# Patient Record
Sex: Male | Born: 1950
Health system: Southern US, Community
[De-identification: ages and names within clinical notes are randomized; demographics above are authoritative.]

## PROBLEM LIST (undated history)

## (undated) DIAGNOSIS — Z9889 Other specified postprocedural states: Secondary | ICD-10-CM

## (undated) DIAGNOSIS — N401 Enlarged prostate with lower urinary tract symptoms: Secondary | ICD-10-CM

## (undated) DIAGNOSIS — I10 Essential (primary) hypertension: Secondary | ICD-10-CM

## (undated) DIAGNOSIS — I219 Acute myocardial infarction, unspecified: Secondary | ICD-10-CM

## (undated) DIAGNOSIS — K219 Gastro-esophageal reflux disease without esophagitis: Secondary | ICD-10-CM

## (undated) DIAGNOSIS — Z720 Tobacco use: Secondary | ICD-10-CM

## (undated) DIAGNOSIS — E785 Hyperlipidemia, unspecified: Secondary | ICD-10-CM

## (undated) DIAGNOSIS — Z951 Presence of aortocoronary bypass graft: Secondary | ICD-10-CM

## (undated) DIAGNOSIS — D649 Anemia, unspecified: Secondary | ICD-10-CM

## (undated) DIAGNOSIS — R112 Nausea with vomiting, unspecified: Secondary | ICD-10-CM

## (undated) DIAGNOSIS — N138 Other obstructive and reflux uropathy: Secondary | ICD-10-CM

## (undated) DIAGNOSIS — I2510 Atherosclerotic heart disease of native coronary artery without angina pectoris: Secondary | ICD-10-CM

## (undated) DIAGNOSIS — Z8719 Personal history of other diseases of the digestive system: Secondary | ICD-10-CM

## (undated) DIAGNOSIS — I739 Peripheral vascular disease, unspecified: Secondary | ICD-10-CM

## (undated) DIAGNOSIS — G709 Myoneural disorder, unspecified: Secondary | ICD-10-CM

## (undated) DIAGNOSIS — I70209 Unspecified atherosclerosis of native arteries of extremities, unspecified extremity: Secondary | ICD-10-CM

## (undated) HISTORY — DX: Atherosclerotic heart disease of native coronary artery without angina pectoris: I25.10

## (undated) HISTORY — DX: Tobacco use: Z72.0

## (undated) HISTORY — PX: BACK SURGERY: SHX140

## (undated) HISTORY — DX: Acute myocardial infarction, unspecified: I21.9

## (undated) HISTORY — DX: Unspecified atherosclerosis of native arteries of extremities, unspecified extremity: I70.209

## (undated) HISTORY — DX: Peripheral vascular disease, unspecified: I73.9

## (undated) HISTORY — DX: Hyperlipidemia, unspecified: E78.5

## (undated) HISTORY — DX: Presence of aortocoronary bypass graft: Z95.1

---

## 1998-08-05 ENCOUNTER — Inpatient Hospital Stay (HOSPITAL_COMMUNITY): Admission: RE | Admit: 1998-08-05 | Discharge: 1998-08-06 | Payer: Self-pay | Admitting: Neurosurgery

## 1999-07-01 ENCOUNTER — Emergency Department (HOSPITAL_COMMUNITY): Admission: EM | Admit: 1999-07-01 | Discharge: 1999-07-01 | Payer: Self-pay | Admitting: Emergency Medicine

## 2001-11-24 DIAGNOSIS — I219 Acute myocardial infarction, unspecified: Secondary | ICD-10-CM

## 2001-11-24 HISTORY — DX: Acute myocardial infarction, unspecified: I21.9

## 2001-12-03 ENCOUNTER — Encounter: Payer: Self-pay | Admitting: Cardiology

## 2001-12-03 ENCOUNTER — Inpatient Hospital Stay (HOSPITAL_COMMUNITY): Admission: EM | Admit: 2001-12-03 | Discharge: 2001-12-06 | Payer: Self-pay | Admitting: *Deleted

## 2001-12-03 HISTORY — PX: CARDIAC CATHETERIZATION: SHX172

## 2002-01-27 ENCOUNTER — Inpatient Hospital Stay (HOSPITAL_COMMUNITY): Admission: EM | Admit: 2002-01-27 | Discharge: 2002-01-28 | Payer: Self-pay | Admitting: Emergency Medicine

## 2002-01-27 ENCOUNTER — Encounter: Payer: Self-pay | Admitting: Cardiovascular Disease

## 2002-01-30 ENCOUNTER — Ambulatory Visit (HOSPITAL_COMMUNITY): Admission: RE | Admit: 2002-01-30 | Discharge: 2002-01-31 | Payer: Self-pay | Admitting: Cardiovascular Disease

## 2002-01-30 HISTORY — PX: ILIAC ARTERY STENT: SHX1786

## 2002-02-18 ENCOUNTER — Ambulatory Visit (HOSPITAL_COMMUNITY): Admission: RE | Admit: 2002-02-18 | Discharge: 2002-02-18 | Payer: Self-pay | Admitting: Cardiovascular Disease

## 2002-05-06 HISTORY — PX: CARDIAC CATHETERIZATION: SHX172

## 2002-05-07 ENCOUNTER — Inpatient Hospital Stay (HOSPITAL_COMMUNITY): Admission: RE | Admit: 2002-05-07 | Discharge: 2002-05-09 | Payer: Self-pay | Admitting: Cardiovascular Disease

## 2002-06-02 ENCOUNTER — Inpatient Hospital Stay (HOSPITAL_COMMUNITY): Admission: EM | Admit: 2002-06-02 | Discharge: 2002-06-04 | Payer: Self-pay

## 2002-06-03 HISTORY — PX: CARDIAC CATHETERIZATION: SHX172

## 2002-08-06 ENCOUNTER — Ambulatory Visit (HOSPITAL_COMMUNITY): Admission: RE | Admit: 2002-08-06 | Discharge: 2002-08-06 | Payer: Self-pay | Admitting: Internal Medicine

## 2003-03-20 ENCOUNTER — Encounter: Payer: Self-pay | Admitting: Emergency Medicine

## 2003-03-20 ENCOUNTER — Inpatient Hospital Stay (HOSPITAL_COMMUNITY): Admission: EM | Admit: 2003-03-20 | Discharge: 2003-03-25 | Payer: Self-pay | Admitting: Emergency Medicine

## 2003-03-24 HISTORY — PX: CARDIAC CATHETERIZATION: SHX172

## 2003-12-08 ENCOUNTER — Inpatient Hospital Stay (HOSPITAL_COMMUNITY): Admission: EM | Admit: 2003-12-08 | Discharge: 2003-12-09 | Payer: Self-pay | Admitting: Emergency Medicine

## 2003-12-08 HISTORY — PX: CARDIAC CATHETERIZATION: SHX172

## 2005-10-11 ENCOUNTER — Ambulatory Visit (HOSPITAL_COMMUNITY): Admission: RE | Admit: 2005-10-11 | Discharge: 2005-10-11 | Payer: Self-pay | Admitting: Family Medicine

## 2007-07-10 ENCOUNTER — Observation Stay (HOSPITAL_COMMUNITY): Admission: EM | Admit: 2007-07-10 | Discharge: 2007-07-11 | Payer: Self-pay | Admitting: Emergency Medicine

## 2007-07-10 HISTORY — PX: CARDIAC CATHETERIZATION: SHX172

## 2007-12-26 DIAGNOSIS — Z951 Presence of aortocoronary bypass graft: Secondary | ICD-10-CM

## 2007-12-26 HISTORY — PX: CORONARY ARTERY BYPASS GRAFT: SHX141

## 2007-12-26 HISTORY — PX: OTHER SURGICAL HISTORY: SHX169

## 2007-12-26 HISTORY — DX: Presence of aortocoronary bypass graft: Z95.1

## 2008-06-04 ENCOUNTER — Encounter: Admission: RE | Admit: 2008-06-04 | Discharge: 2008-06-04 | Payer: Self-pay | Admitting: Cardiovascular Disease

## 2008-06-08 ENCOUNTER — Encounter (INDEPENDENT_AMBULATORY_CARE_PROVIDER_SITE_OTHER): Payer: Self-pay | Admitting: Cardiovascular Disease

## 2008-06-08 ENCOUNTER — Inpatient Hospital Stay (HOSPITAL_COMMUNITY): Admission: AD | Admit: 2008-06-08 | Discharge: 2008-06-17 | Payer: Self-pay | Admitting: Cardiovascular Disease

## 2008-06-08 HISTORY — PX: CARDIAC CATHETERIZATION: SHX172

## 2008-06-15 ENCOUNTER — Ambulatory Visit: Payer: Self-pay | Admitting: Surgery

## 2008-07-14 ENCOUNTER — Ambulatory Visit: Payer: Self-pay | Admitting: Surgery

## 2008-07-14 ENCOUNTER — Encounter: Admission: RE | Admit: 2008-07-14 | Discharge: 2008-07-14 | Payer: Self-pay | Admitting: Surgery

## 2008-07-16 ENCOUNTER — Encounter (HOSPITAL_COMMUNITY): Admission: RE | Admit: 2008-07-16 | Discharge: 2008-08-05 | Payer: Self-pay | Admitting: Cardiovascular Disease

## 2009-01-04 IMAGING — CR DG CHEST 2V
2 series · 2 of 2 positions shown · non-contrast
Comparison: [REDACTED] chest x-ray 06/13/2008.

CLINICAL DATA: Chest pain.  CABG 06/11/2008.

CHEST - 2 VIEW

[w chest pa]
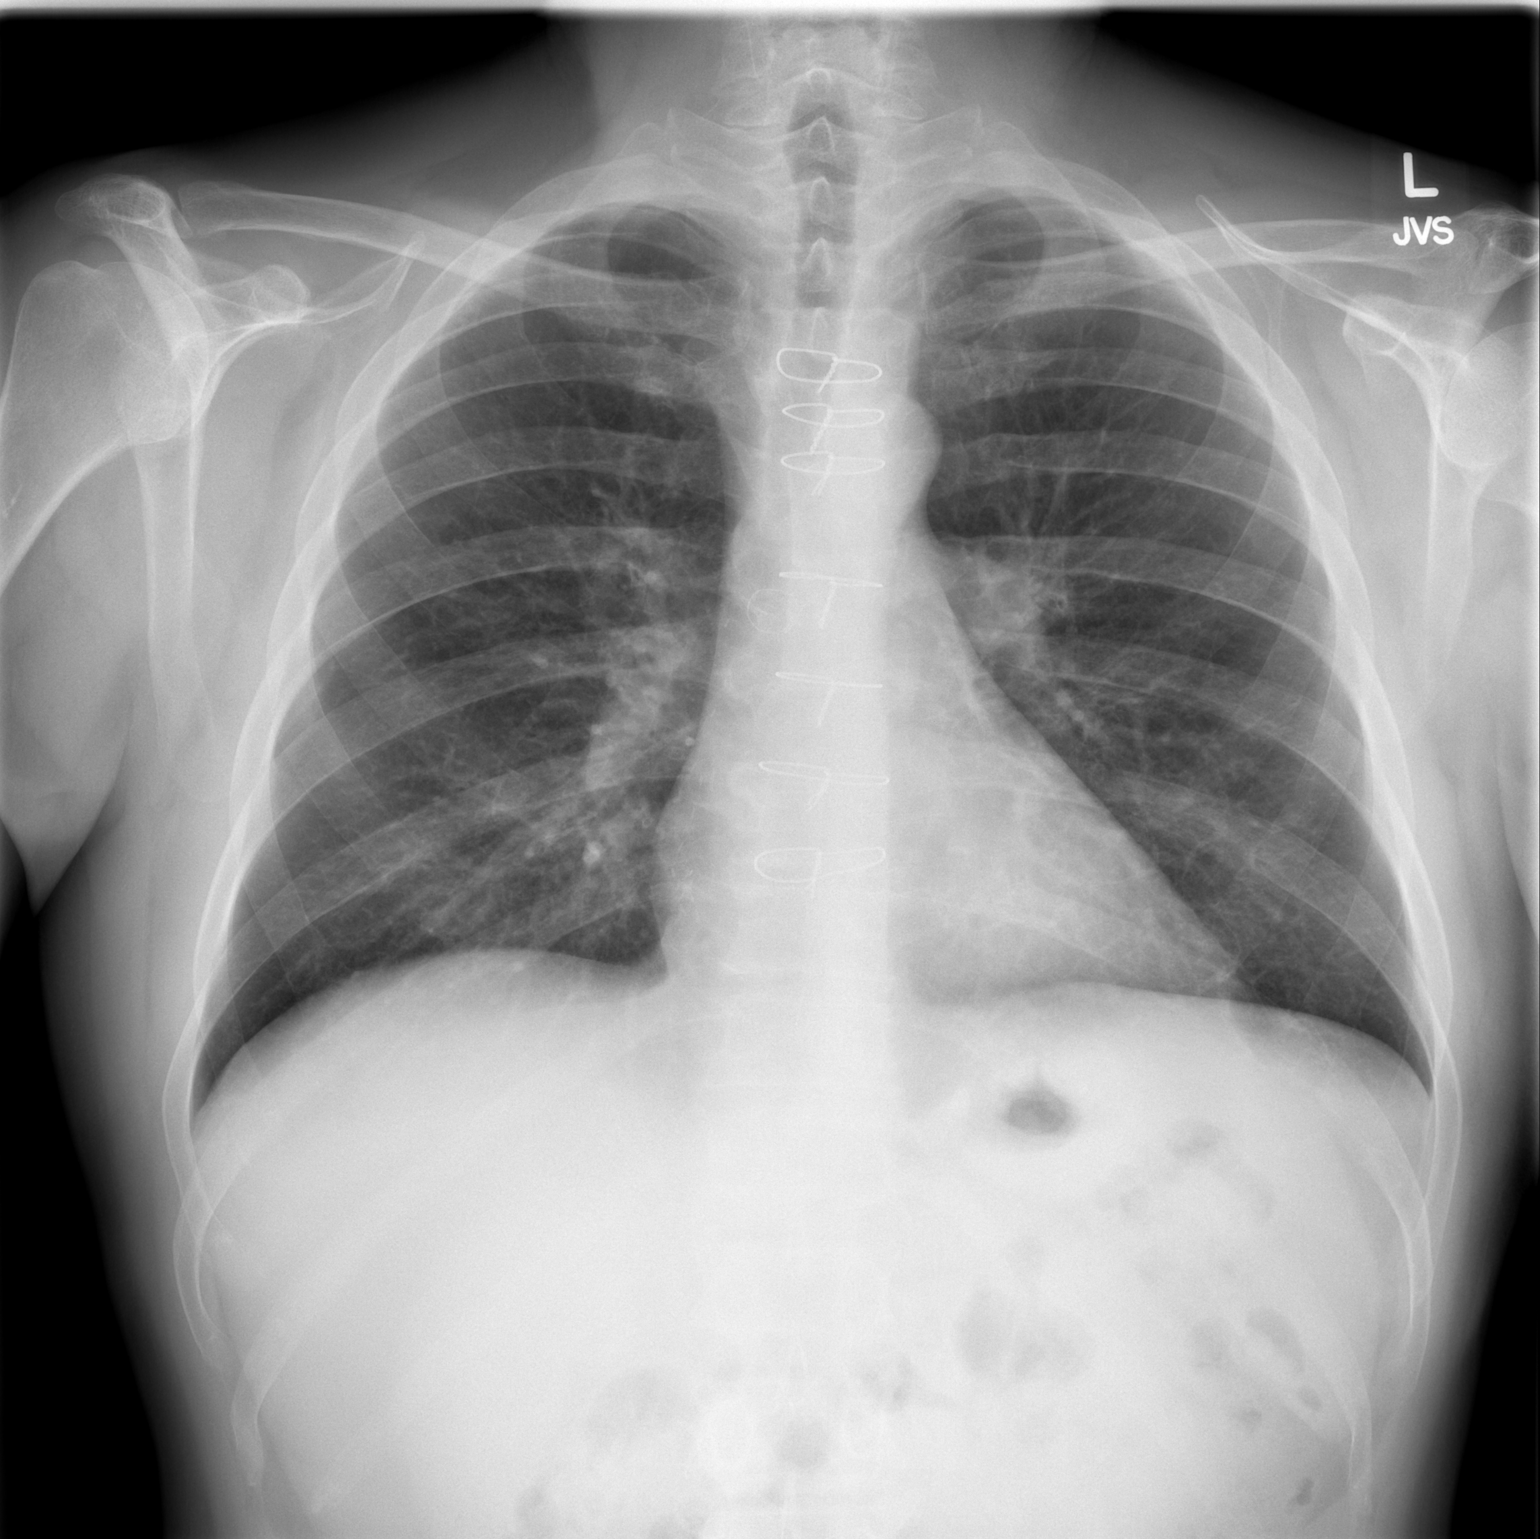

[w chest lat]
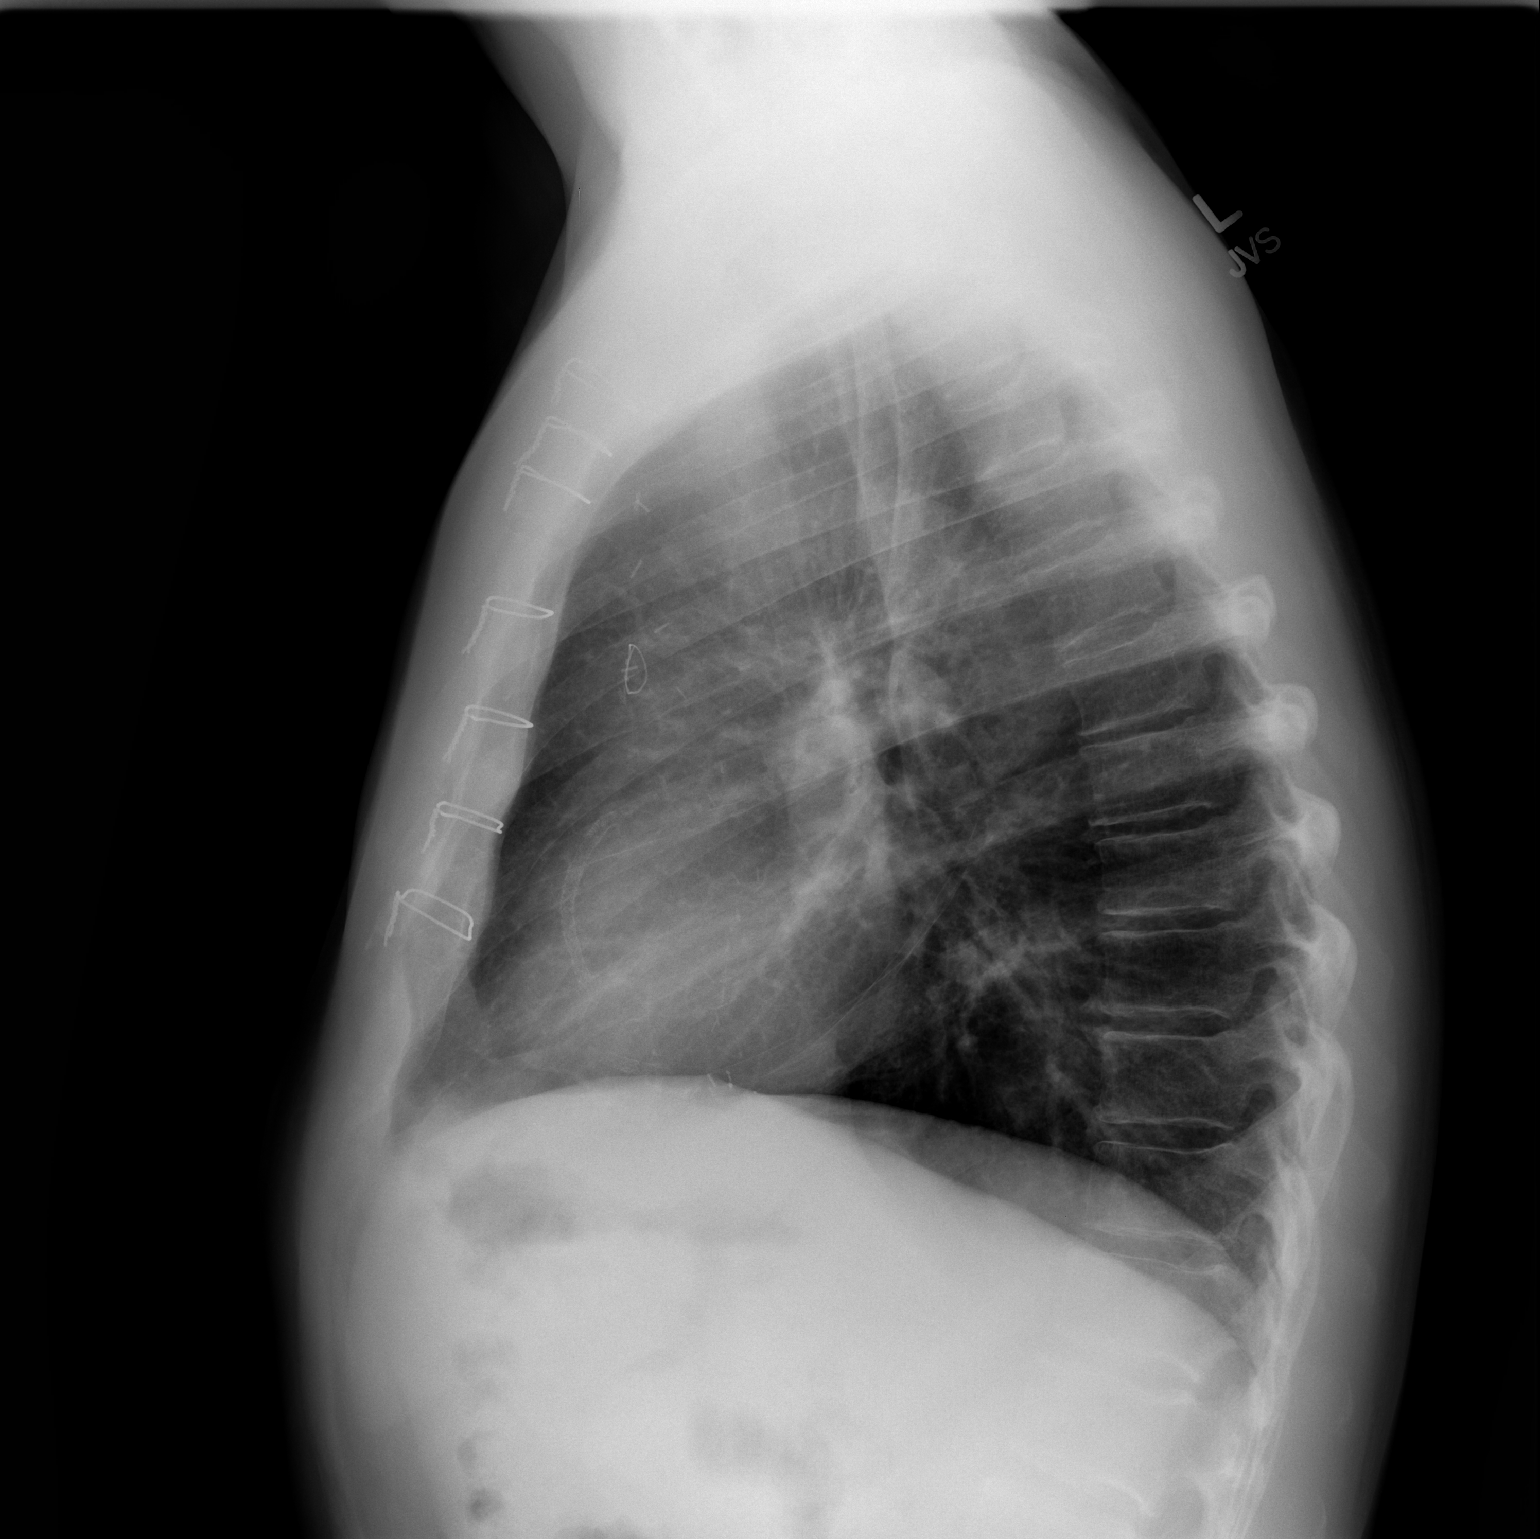

[2 of 2 positions shown; findings below may reference images not displayed]

FINDINGS: Lungs are currently clear with submaximal inspiration.
Heart size is normal with stable post CABG changes and coronary
artery stent best seen on lateral view.  No new significant
abnormality is noted.
IMPRESSION: 1.  Post CABG.
2.  Submaximal inspiration.
3.  No active disease.

## 2010-04-28 ENCOUNTER — Encounter: Admission: RE | Admit: 2010-04-28 | Discharge: 2010-04-28 | Payer: Self-pay | Admitting: Cardiovascular Disease

## 2011-01-15 ENCOUNTER — Encounter: Payer: Self-pay | Admitting: Cardiovascular Disease

## 2011-05-09 NOTE — Op Note (Signed)
NAMEJACADEN, [name]                ACCOUNT NO.:  [id number]   MEDICAL RECORD NO.:  [id number]          PATIENT TYPE:  INP   LOCATION:  2307                         FACILITY:  [facility]   PHYSICIAN:  Evelene Croon, M.D.     DATE OF BIRTH:  [birth year]   DATE OF PROCEDURE:  06/11/2008  DATE OF DISCHARGE:                               OPERATIVE REPORT   PREOPERATIVE DIAGNOSIS:  Two-vessel coronary artery disease with high-  grade in-stent restenosis in the right coronary artery.   POSTOPERATIVE DIAGNOSIS:  Two-vessel coronary artery disease with high-  grade in-stent restenosis in the right coronary artery.   OPERATIVE PROCEDURE:  Median sternotomy, extracorporeal circulation,  coronary artery bypass graft surgery x1 using the free right radial  artery graft to the posterior descending coronary artery.   SURGEON:  Evelene Croon, MD   ASSISTANT:  Stephanie Acre. Dominick, PA-C   ANESTHESIA:  General endotracheal.   CLINICAL HISTORY:  This patient is a 60 year old gentleman with history  of coronary disease status post multiple percutaneous interventions on  the right coronary artery with stents, cutting balloon angioplasty,  brachytherapy, as well as stenting of the proximal LAD in 2003.  He now  presents with crescendo anginal symptoms.  Cardiac catheterization  showed severe in-stent restenosis within the right coronary artery  proximally and in the midportion.  There was also a stent in the distal  right coronary artery up to the takeoff of the posterior descending  branch which had no significant stenosis within that.  The posterior  descending and posterolateral branches were relatively small vessels.  The LAD stent had insignificant smooth less than 30% narrowing.  There  is insignificant left circumflex disease.  The left main was okay.  Left  trigger function was normal.  After review of the angiogram and  examination of the patient, it was felt that coronary bypass graft  surgery to  the distal right coronary artery or posterior descending  coronary artery was the best treatment.  I did not feel  revascularization of the LAD was indicated because there was no  significant narrowing and most likely that graft would fail.  I  discussed the operative procedure of coronary bypass surgery with he and  his wife including use of the right radial artery graft.  We discussed  the possibility of off-pump coronary bypass surgery depending on the  anatomy.  We did do preoperative upper extremity arterial Dopplers which  were normal in the right upper extremity but abnormal in the left and  therefore, I felt the right radial artery would be best.  I discussed  the operative procedure with the patient and his wife including  alternatives, benefits, and risks including but not limited to bleeding,  blood transfusion, infection, stroke, myocardial infarction, graft  failure, and death.  I also discussed the importance of maximum cardiac  risk factor reduction.  They understood and agreed to proceed.   OPERATIVE PROCEDURE:  The patient was taken to the operating room and  placed on table in supine position.  After induction of general  endotracheal anesthesia, a Foley  catheter was placed in the bladder  using a sterile technique.  Then the right arm was placed out on an  armboard.  The chest, abdomen, and both lower extremities as well as the  right arm were prepped with Betadine soap and solution and draped in the  usual sterile manner.  The right renal artery was harvested first  through a longitudinal incision.  Prior to dividing the branches, an  atraumatic vascular clamp was placed on the artery distally and  confirmation of a good Doppler signal beyond the clamp was confirmed.  Then the side branches were divided using the harmonic scalpel.  The  radial artery was a moderate-sized vessel with excellent pulsation in  it.  It was transected proximally and distally and the stumps  suture-  ligated with 2-0 silk suture ligatures.  There was complete hemostasis.  The radial artery was then flushed with heparin saline solution and the  side branches clipped.  The incision was then closed in layers using 2-0  Vicryl to close the subcutaneous tissue and 3-0 Vicryl subcuticular skin  closure.  The sponge, needle, and instrument counts were correct  according to scrub nurse.  Dry sterile dressing was applied over this  incision and the arm was wrapped with an Ace wrap.  The right arm was  then positioned at the side.   Then the chest was opened through a median sternotomy incision and the  pericardium opened midline.  Examination of the heart showed good  ventricular contractility.  The ascending aorta had no palpable plaques  in it.  The right coronary was diffusely diseased.  The stent in the  distal right coronary artery was palpable and extended right up to the  takeoff of the posterior descending branch.  Unfortunately, there was a  large vein lying over the distal right coronary artery beyond the  posterior descending branch and I was not able to expose the artery in  this location.  There was also a large posterior descending vein lying  over most of the posterior descending artery itself.  I was able to  visualize the posterior descending artery in its midportion where it was  relatively small but felt to be graftable.  Given its size, I did not  feel that would be suitable for off-pump surgery.   Then the patient was heparinized and when an adequate activated clotting  time was achieved, the distal ascending aorta was cannulated using a 20-  [place] aortic cannula for arterial inflow.  Venous outflow was achieved  using a two-stage venous cannula through the right atrial appendage.  An  antegrade cardioplegia and vent cannula was inserted in the aortic root.   The patient was placed on cardiopulmonary bypass.  The aorta was then  crossclamped and 800 mL of cold  blood antegrade cardioplegia was  administered in the aortic root with quick arrest of the heart.  Systemic hypothermia to 34 degrees centigrade and topical hypothermic  iced saline was used.  A temperature probe was placed in the septum and  insulating pad in the pericardium.   Then the midportion of the posterior descending coronary artery was  opened.  The vessel had an internal diameter of about 1.5 mm.  It was  fairly thin-walled and fragile.  Then the radial artery was anastomosed  to it in an end-to-side manner using continuous 8-0 Prolene suture.  Then the dose of cardioplegia was given down the graft and hemostasis at  the anastomosis was confirmed.  Then with crossclamp in place, the  proximal end of the radial artery graft was performed to the aortic root  in an end-to-side manner using continuous 7-0 Prolene suture.  Then the  clamp was removed from the mammary pedicle.  There was rapid return of  spontaneous sinus rhythm.  The patient rewarmed to 37 degrees  centigrade.  Graft markers were placed around the proximal anastomosis.  Two temporary left ventricular and right atrial pacing wires were placed  and brought out through the skin.   When the patient rewarmed to 37 degrees centigrade, he was weaned from  cardiopulmonary bypass on no inotropic agents.  Total bypass time was 53  minutes.  Crossclamp time was about 41 minutes.  Then protamine was  given and venous and aortic cannulae were removed without difficulty.  Hemostasis was achieved.  Two chest tubes were placed into the posterior  pericardium and one in the anterior mediastinum.  The sternum was then  closed with #6 stainless steel wires.  The fascia was closed with  continuous #1 Vicryl suture.  Subcutaneous tissue was closed with  continuous 2-0 Vicryl and the skin with 3-0 Vicryl subcuticular closure.  The sponge, needle, and instrument counts  were correct according to scrub nurse.  Dry sterile dressings  were  applied over the incisions around chest tubes which had Pleur-Evac  suction.  The patient remained hemodynamically stable and was  transferred to the SICU in guarded but stable condition.      Evelene Croon, M.D.  Electronically Signed     BB/MEDQ  D:  06/11/2008  T:  06/12/2008  Job:  [id number]   cc:   Gerlene Burdock A. Alanda Amass, M.D.

## 2011-05-09 NOTE — Cardiovascular Report (Signed)
Jonathan [name], Jonathan [name]                ACCOUNT NO.:  [id number]   MEDICAL RECORD NO.:  [id number]          PATIENT TYPE:  OIB   LOCATION:  2899                         FACILITY:  [facility]   PHYSICIAN:  Richard A. Alanda Amass, M.D.DATE OF BIRTH:  March 12, 1951   DATE OF PROCEDURE:  DATE OF DISCHARGE:                            CARDIAC CATHETERIZATION   PROCEDURE:  Retrograde central aortic catheterization, selective  coronary angiography pre and post IC nitroglycerin administration, LV  angiogram RAO, LAO projection, subselective LIMA, abdominal aortic  angiogram, midstream PA projection, PA hand ejection; bilateral iliac  angiography, midstream PA projection; right common femoral artery  closure with 6-French StarClose nitinol clip device successful.   PROCEDURE IN DETAIL:  The patient was brought to the second floor CP Lab  in a postabsorptive state after premedication with 5 mg Valium p.o.  Preoperative laboratory showed an H&H of 14.9/42, platelet count  197,000, normal coags, BUN and creatinine 13.0/0.9, and normal glucose.  The patient was on medical therapy including aspirin and Plavix.  The  right groin was prepped, draped in the usual manner.  Xylocaine 1% was  used for local anesthesia and RCFA was entered with single anterior  puncture using an 18 thin wall needle.  A 6-French short sidearm sheaths  were inserted without difficulty.  Diagnostic coronary angiography was  done with 6-French 4-cm tape Cordis preformed coronary and pigtail  catheter, subselective LIMA was done with the right coronary catheter by  hand injection demonstrating patent LIMA, antegrade vertebral flow, and  minor subclavian disease without stenosis.  LV angiogram was done in the  RAO and LAO projection through a pigtail catheter 20 mL, 14 mL per  second brief projection.  Pullback pressure CA was performed which  showed no gradient across the aortic valve.  Catheter was pulled down  above the level of the renal  arteries and abdominal angiogram by hand  injection was done showing patent single renal arteries bilaterally,  patent SMA, and celiac axis.  The catheter was then pulled down above  the iliac bifurcation and abdominal angiogram was done at 25 mL, 20 mL  per second.  This demonstrated a widely patent right common iliac stent  with less than 20% narrowing from the ostial to the midportion.  The  right hypogastric was intact.  This was a balloon expandable remote  stent.  The self-expanding stent from the midportion of the LCIA  crossing over the hypogastric into the LEIA was widely patent with  approximately 30% narrowing.  There was good runoff to the proximal  femorals with patent SFA profunda bifurcation bilaterally.   Catheter was removed.  Side-arm sheath was flushed.  Hand injection of  the right femoral showed good puncture into the right common femoral.  A  nitinol 6-French StarClose device was deployed successfully.  The  patient tolerated the procedure well, was transferred to holding area  for postoperative care in stable condition.  He was given a total of 4  mg of Versed IV for sedation during the procedure.   PRESSURES:  LV:  130/0; LVEDP 16 mmHg.  CA:  130/80.  No gradient across the aortic valve on catheter pullback.   Fluoroscopy:  Fluoroscopy showed the previously placed LAD, proximal  mid, and distal RCA stents and bilateral iliac stents.   There was 1+ left coronary calcification present.  No significant  intracardiac or valvular calcification.   LV angiogram in the RAO and LAO projection showing normally contracting  left ventricle with no wall motion abnormality and EF greater than 55%,.  No significant mitral regurgitation.   The main left coronary was normal.   Left anterior descending had a previously placed bare-metal stent (203)  in the proximal portion after the first septal perforator.  This was  widely patent with less than 30% narrowing.  This was  unchanged from  prior angiography.  There was good flow to the distal LAD which  trifurcated at the apex.  There was some minor narrowing of 30% in the  midportion of the LAD that was patent, small DX1, DX2, and DX3 that had  no significant stenosis.   The circumflex was a moderate size vessel giving off an OD proximally  bifurcated was then but normal that was approximately 40% narrowing in  the junction of the proximal third at an OM branch and there were 3 OM  branches that were widely patent in the distal circumflex that was  patent along with patent AV groove branch.   The right coronary was a dominant vessel.  There was decreased flow  which was TIMI 2-1/2 but there was antegrade flow.  There was ostial  narrowing of 95% and in-stent restenosis in the proximal portion of  about 95% segmentally and concentric.  There was another area of 95%  narrowing in the midportion of the RCA in the previously placed stent  and possibly unstented area overlapping the previously DES stents.  The  distal RCA had prior stenting with bare-metal stents in 2002, and this  remained patent with less than 30% narrowing.  The PLA bifurcated and  the PDA was large with no significant stenosis.   DISCUSSION:  Jonathan [name] is a 60 year old white married father of 2 with 1  grandchild.  He has had multiple prior interventions.  He suffered a DMI  with acute intervention December 2002.  He had distal RCA tandem  stenting for recurrent angina, bare metal, in February 2003, and has had  no restenosis long-term.  He is also had staged LAD stenting in February  2003, with no long-term restenosis BMS.  He has had multiple procedures  done in the proximal and mid RCA for progression of disease and in-stent  restenosis.  He had cutting balloon atherectomy and brachytherapy for in-  stent restenosis of distal RCA in 2003, bare-metal stents.  In 2004, he  had a cutting balloon atherectomy and stent and sandwich DES  stents for  ISR in 2004.  In December 2004, he had Taxus stent placed to the  proximal RCA for 95% stenosis beyond the previously placed Taxus stent  in the proximal RCA and he had another Taxus stent placed in the mid RCA  for a 95% stenosis both of these were 3.0.  Prior to this, he had a  Cypher stent placed in March 2004, to the mid RCA.   His last intervention was done July 2008, by Dr. Clarene Duke when he had  cutting balloon atherectomy done to the proximal mid RCA for multiple  areas of in-stent restenosis.   He is now admitted with recurrent angina for about 3 weeks duration,  quite typical on medical therapy including aspirin and Plavix.  He has  high-grade ostial and proximal RCA in-stent restenosis and high-grade  mid RCA in-stent restenoses.  He has had multiple procedures and prior  sandwich DES stents as outlined above.  There was no significant  progression of his left coronary artery disease.  I believe he is a  candidate for single-vessel bypass of the distal RCA.  This could  possibly be considered to be done off pump.  I have discussed this with  the patient.  He is amendable to this approach as is his family, but we  will have further discussions, and CVTS consultation is pending.   CATHETERIZATION DIAGNOSES:  1. Arteriosclerotic heart disease with recurrent angina secondary to      in-stent diffuse restenosis, ostial proximal, and mid right      coronary artery.  2. Systemic hypertension.  3. Hyperlipidemia.  4. Remote smoker, quit July 2008.  5. Remote diaphragmatic myocardial infarct, treated with emergency      angioplasty and distal right coronary artery stenting December      2002.  6. Left anterior descending stenting, staged, February 2003, bare      metal, no restenosis long-term.  7. Mid right coronary artery bare-metal stent December 2002, with      cutting balloon atherectomy and brachytherapy for in-stent      restenosis.  8. Subsequent Cypher  drug-eluting stent overlapping stent May 2003,      and March 2004, mid right coronary artery for progression of      disease.  9. Proximal right coronary artery Taxus stent March 2004, for      progression of disease.  10.Drug-eluting stenting mid right coronary artery, August 06, 2003.  11.Cutting balloon atherectomy and stenting December 06, 2003, and      well-preserved left ventricular function.  12.Remote bilateral iliac stents February 2003, widely patent.      Richard A. Alanda Amass, M.D.  Electronically Signed     RAW/MEDQ  D:  06/08/2008  T:  06/08/2008  Job:  [id number]   cc:   Kirk Ruths, M.D.  CP Lab

## 2011-05-09 NOTE — Consult Note (Signed)
NAMEKOREY, PRASHAD                ACCOUNT NO.:  [id number]   MEDICAL RECORD NO.:  [id number]          PATIENT TYPE:  INP   LOCATION:  2913                         FACILITY:  [facility]   PHYSICIAN:  Evelene Croon, M.D.     DATE OF BIRTH:  05-17-1951   DATE OF CONSULTATION:  06/08/2008  DATE OF DISCHARGE:                                 CONSULTATION   REFERRING PHYSICIAN:  Richard A. Alanda Amass, M.D.   REASON FOR CONSULTATION:  Severe right coronary in-stent restenosis with  crescendo angina.   CLINICAL HISTORY:  I was asked by Dr. Alanda Amass to evaluate Mr. Demarest  for consideration of coronary artery bypass graft surgery for the above  problem.  He is a 60 year old gentleman with a long complicated coronary  history.  He had emergency PTCA with an acute DMI on December 03, 2001.  He had stenting of his mid right coronary artery at the time of this  myocardial infarction in 2002 and subsequently had in-stent restenosis,  which required cutting balloon atherectomy and brachytherapy in 2003.  He also had a distal right coronary artery tandem stenting for recurrent  angina in February 2003.  He underwent a bare-metal stenting of the LAD  in February 2003 and has had no long-term restenosis there.  He  subsequently underwent overlapping Cypher drug-eluting stenting in the  right coronary artery in March 2004 and had another stent placed in the  proximal right coronary artery in 2004 for progression of disease.  His  last intervention was in December 2004 where a cutting balloon  atherectomy and sandwich stenting for in-stent restenosis in the right  coronary artery.  He did well until 2008 when he developed recurrent  angina, and catheterization showed restenosis in the proximal and mid  right coronary stents.  He underwent cutting balloon angioplasty of the  proximal and mid and distal right coronary artery in July 2008.  He now  returns with recurrent crescendo angina with symptoms similar  to his  prior chest pain.  It has been relieved with nitroglycerin.  He has had  a few episodes occurring at rest.  Most of the symptoms have occurred  with exertion and had been occurring essentially daily.  He underwent  repeat cardiac catheterization today, which showed 95% in-stent  restenosis within the proximal and mid right coronary stents.  The  distal stent in the right coronary artery has about 30% narrowing.  Beyond this stented area, there was no significant disease.  The  proximal LAD stent is widely patent with less than 30% diffuse  narrowing, which appears smooth.  There is insignificant left circumflex  narrowing.  Left trigger function is normal.  The patient's iliac stents  are widely patent.  There is no gradient across the aortic valve and no  mitral regurgitation.   REVIEW OF SYSTEMS:  His review of systems is as follows.  GENERAL:  He  denies any fever or chills.  He has had no recent change in weight.  He  does report some fatigue.  EYES:  Negative.  ENT:  Negative.  ENDOCRINE:  He denies diabetes or hypothyroidism.  CARDIOVASCULAR:  As above.  Denies PND or orthopnea.  He does have some exertional dyspnea with his  chest pain.  He has had no peripheral edema.  Denies palpitations.  RESPIRATORY:  Denies cough or sputum production.  GI:  He has had no  nausea or vomiting.  He does have some indigestion, for which he takes  Nexium.  He denies any melena or bright red blood per rectum.  GU:  He  denies dysuria or hematuria.  MUSCULOSKELETAL:  Denies arthralgias or  myalgias.  VASCULAR:  He denies claudication or phlebitis.  NEUROLOGIC:  He denies any focal weakness.  He does get pain in his legs with  standing, which he believes is due to his lower back.  He also has some  numbness in his right hand at times.  He denies any history of TIA or  stroke.  He denies any amaurosis fugax.  PSYCHIATRIC:  Negative.  HEMATOLOGICAL:  Negative.   ALLERGIES:  NIASPAN.   PAST  MEDICAL HISTORY:  Significant for hypertension and hyperlipidemia.  He has history of coronary disease as mentioned above, status post  multiple percutaneous interventions.  He has a history of peripheral  vascular disease status post stenting of both common iliac arteries.  He  has a history of femoral artery stenosis.  The patient has had lower  back surgery in the past.   MEDICATIONS:  Prior to admission were Nexium 40 mg daily, Plavix 75 mg  daily, aspirin 81 mg daily, Zocor 40 mg q.h.s., Toprol XL 50 mg daily,  and Norvasc 2.5 mg daily.  He takes isosorbide 30 mg daily, p.r.n.  sublingual nitroglycerin, fish oil daily, and multivitamin daily.   SOCIAL HISTORY:  He is married, has 3 children and 1 grandchild.  He  remains active, playing golf and kayaking.  He quit smoking in 2008.  He  occasionally drinks beer.   FAMILY HISTORY:  Strongly positive for cardiac disease.   PHYSICAL EXAMINATION:  VITAL SIGNS:  His blood pressure 135/64, pulse is  80 and regular.  Respiratory rate is 18, unlabored.  Weight is 76.8 kg.  He is 5 feet 8 inches tall.  He is afebrile.  He is a well-developed  white male in no distress.  HEENT:  Normocephalic and atraumatic.  Pupils are equal and reactive to  light and accommodation.  Extraocular muscles are intact.  His throat is  clear.  NECK:  Normal carotid pulses bilaterally.  There are no bruits.  There  is no adenopathy or thyromegaly.  CARDIAC:  Regular rate and rhythm with normal S1 and S2.  There is no  murmur, rub, or gallop.  LUNG:  Clear.  ABDOMEN:  Active bowel sounds.  His abdomen is soft and nontender.  There are no palpable masses or organomegaly.  EXTREMITIES:  No peripheral edema.  Pedal pulses are palpable  bilaterally.  SKIN:  Warm and dry.  NEUROLOGIC:  Alert and oriented x3.  Motor and sensory exams are grossly  normal.   IMPRESSION:  Mr. Koppen has severe in-stent restenosis within the  proximal and mid right coronary arteries  with insignificant left  anterior descending  and left circumflex disease.  He has crescendo  anginal symptoms and I agree that coronary bypass graft surgery to the  right coronary artery is the best treatment to prevent further ischemia  and infarction and to improve his quality of life.  I do not think his  left anterior  descending requires revascularization at this time because  the graft would likely become atretic given no significant stenosis.  He  may develop stenosis in the future that would require intervention.  Hopefully, we can use a radial artery graft to revascularize his distal  right coronary artery or posterior descending branch.  I do not think a  right internal mammary graft would reach to this area in situ and may be  too short as a free graft.  We will obtain upper extremity arterial  Dopplers to assess whether radial artery could be used.  He is right-  handed.  I discussed the operative procedure in general with him  including alternatives, benefits, and risks including but not limit to  bleeding, blood transfusion, infection, stroke, myocardial infarction,  graft failure, and death.  Also discussed the importance of maximum  cardiac risk factor reduction including continued smoking cessation.  He  understands and agrees to proceed.  We planned to do this on Thursday of  this week.      Evelene Croon, M.D.  Electronically Signed     BB/MEDQ  D:  06/08/2008  T:  06/09/2008  Job:  [id number]

## 2011-05-09 NOTE — Assessment & Plan Note (Signed)
OFFICE VISIT   [name], [name]  DOB:  16-May-1951                                        July 14, 2008  CHART #:  [id number]   The patient returns today for followup status post coronary artery  bypass graft surgery x1 using a right radial artery graft to the  posterior descending coronary artery on 06/11/2008.  He has been feeling  well overall, without chest pain or shortness of breath.  His only  complaint is that he has been having problems with impotence, which is  not new.   PHYSICAL EXAMINATION:  VITAL SIGNS:  His blood pressure is 148/80, pulse  is 88 and regular.  Respiratory rate is 18, and unlabored.  Oxygen  saturation on room air is 97%.  GENERAL:  He looks well.  CARDIAC:  Regular rate and rhythm with normal heart sounds.  CHEST:  His lung exam is clear.  The chest incision is healing well and  sternum is stable.  The right arm incision is healing well.  EXTREMITIES:  His right hand is neurovascularly intact.   His medications are Zocor 40 mg daily, Plavix 75 mg daily, Altace 5 mg  daily, Toprol-XL 50 mg daily, Nexium 40 mg daily, and Flomax 0.4 mg  daily.   Followup chest x-ray shows clear lung fields and no pleural effusions.   IMPRESSION:  Overall, the patient has recovered well following his heart  surgery.  I encouraged him to continue walking as much as possible.  I  told him he could return to driving a car, but should refrain from  lifting anything heavier than 10 pounds for total of 3 months from date  of surgery.  I asked him to contact his urologist to discuss his  impotence further, which  certainly is treatable.  He will continue to follow up with his  cardiologist, Dr. Susa Griffins and will contact me if he develops  any problems with his incisions.   Evelene Croon, M.D.  Electronically Signed   BB/MEDQ  D:  07/14/2008  T:  07/15/2008  Job:  [id number]   cc:   Gerlene Burdock A. Alanda Amass, M.D.

## 2011-05-09 NOTE — H&P (Signed)
[name], [name]                ACCOUNT NO.:  [id number]   MEDICAL RECORD NO.:  [id number]          PATIENT TYPE:  EMS   LOCATION:  MAJO                         FACILITY:  [facility]   PHYSICIAN:  Ulyses Amor, MD DATE OF BIRTH:  09/14/1951   DATE OF ADMISSION:  07/09/2007  DATE OF DISCHARGE:                              HISTORY & PHYSICAL   [name] is a 60 year old white man who is admitted to [name] for further evaluation of chest pain.   Patient has a history of coronary artery disease.  He has previously  undergone multiple percutaneous interventions to the LAD and right  coronary artery.  He has no history of myocardial infarction, congestive  heart failure, or arrhythmia.   Patient presented to the emergency department after experiencing two  episodes of chest pain today.  The first episode occurred this afternoon  while he was mowing.  He stopped, took 2 nitroglycerin, and the chest  pain resolved within 3-4 minutes.  Some time later, after he had gone  inside, he experienced another episode of chest pain.  This episode,  too, resolved with nitroglycerin in 3-4 minutes.  On both occasions, he  took 2 nitroglycerin tablets.  He has experienced no further chest pain.  The chest pain was his typical cardiac chest pain, described as a  substernal ache.  It did not radiate.  It was not associated with  dyspnea and nausea but no diaphoresis.  There were no other exacerbating  or ameliorating factors.  It appeared not to be related to position,  meals, or respirations.  He is free of chest pain and otherwise  asymptomatic at this time.  As noted, this chest pain was similar in  quality to his prior cardiac chest pain.   The patient has a number of risk factors for coronary artery disease,  including hypertension, dyslipidemia, family history (father and two  brothers) and continued smoking (approximately five cigarettes a day).  There is no history of  diabetes mellitus.   The patient also has a history of peripheral vascular disease.  He has  undergone angioplasty of both iliacs.   The patient's past medical history is otherwise unremarkable.   OPERATIONS:  None.   ALLERGIES:  None.   MEDICATIONS:  1. Ramipryl 5 mg p.o. daily.  2. Nexium 40 mg p.o. daily.  3. Plavix 75 mg p.o. daily.  4. Toprol XL 100 mg p.o. daily.  5. Zocor 40 mg p.o. daily.  6. Aspirin 81 mg p.o. daily.   SOCIAL HISTORY:  The patient is retired.  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  He lives with his wife.  He smokes, as  described above.  He drinks occasional alcohol.   FAMILY HISTORY:  Notable for coronary artery disease in his father and  two brothers, as noted above.   REVIEW OF SYSTEMS:  No new problems related to his HEENT, lungs,  genitourinary system, or extremities.  There is no history of neurologic  or psychiatric disorder.  There is no history of fever, chills, or  weight loss.  He does have a history of gastroesophageal reflux.   PHYSICAL EXAMINATION:  VITAL SIGNS:  Blood pressure 117/70, pulse 87 and  regular, respirations 20, temperature 97.9.  GENERAL:  Patient was a middle-aged white male in no discomfort.  He was  alert, oriented, appropriate, and responsive.  HEENT:  Normal.  NECK:  Without thyromegaly or adenopathy.  Carotid pulses were palpable  bilaterally and without bruits.  CARDIAC:  A normal S1 and S2.  There was no S3, S4, murmur, rub, or  click.  Cardiac rhythm was regular.  No chest wall tenderness was noted.  LUNGS:  Clear.  ABDOMEN:  Soft and nontender.  There was no mass, hepatosplenomegaly,  bruit, distention, rebound, guarding, or rigidity.  Bowel sounds were  normal.  RECTAL/GENITAL:  Not performed, not pertinent to the reason for acute  care hospitalization.  EXTREMITIES:  Without edema, deviation, or deformity.  Radial and  dorsalis pedal pulses were palpable bilaterally.  NEUROLOGIC:   Brief screening neurologic survey was normal.   Electrocardiogram was normal.   Chest radiograph, according to the radiologist, demonstrated no evidence  of acute cardiopulmonary disease.   The initial set of cardiac markers revealed a myoglobin of 108, CK-MB  less than 1, and troponin less than 0.05.  BUN was 9, creatinine 0.9,  and potassium 4.  White count was 9.3 with a hemoglobin of 14.9 and  hematocrit of 42.8.  The remaining studies were pending at the time of  this dictation.   IMPRESSION:  1. Unstable angina.  2. Coronary artery disease, status post multiple percutaneous coronary      interventions to the left anterior descending artery and right      coronary artery.  3. Peripheral vascular disease, status post angioplasties to bilateral      iliacs.  4. Hypertension.  5. Dyslipidemia.  6. Gastroesophageal reflux.   PLAN:  1. Telemetry.  2. Serial cardiac enzymes.  3. Aspirin.  4. Intravenous heparin.  5. Plavix.  6. Metoprolol.  7. Intravenous nitroglycerin.  8. Discontinuation of smoking discussed.  9. Further measures per Dr. Alanda Amass.      Ulyses Amor, MD  Electronically Signed     MSC/MEDQ  D:  07/10/2007  T:  07/10/2007  Job:  [id number]   cc:   Gerlene Burdock A. Alanda Amass, M.D.

## 2011-05-09 NOTE — Cardiovascular Report (Signed)
[name], [name]                ACCOUNT NO.:  [id number]   MEDICAL RECORD NO.:  [id number]          PATIENT TYPE:  INP   LOCATION:  6522                         FACILITY:  [facility]   PHYSICIAN:  Thereasa Solo. Little, M.D. DATE OF BIRTH:  09/01/1951   DATE OF PROCEDURE:  07/10/2007  DATE OF DISCHARGE:  07/11/2007                            CARDIAC CATHETERIZATION   INDICATIONS FOR TEST:  This is a 60 year old male who has had multiple  percutaneous interventions to his LAD and right coronary artery.  He  presented after 4 years of no exertional angina with chest pain  nitroglycerin responsive.  Cardiac markers are negative.  He is brought  to the cath lab for reevaluation of his cardiac anatomy.   After obtaining informed consent, the patient was prepped and draped in  the usual sterile fashion exposing the right groin.  Following local  anesthetic with 1% Xylocaine, the Seldinger technique was employed and a  5-French introducer sheath was placed in the right femoral artery.  Left  and right coronary arteriography and ventriculography in the RAO  projection was performed.  Following this, percutaneous intervention to  his RCA was performed.   COMPLICATIONS:  None.   EQUIPMENT:  5-French Judkins configuration diagnostic catheters.  Interventional equipment is listed below.   TOTAL CONTRAST USED:  165 mL.   INTRAVENOUS MEDICATIONS:  Angiomax; Versed 2 mg; fentanyl 25.  In  addition to this, he was given 600 mg of oral Plavix.   RESULTS:   HEMODYNAMIC MONITORING:  Central aortic pressure was 128/68.  His left  ventricular pressure was 114/3, but at the time of pullback there was no  significant aortic valve gradient.   VENTRICULOGRAPHY:  Ventriculography in the RAO projection revealed  normal LV systolic function with no wall motion abnormalities.  His  ejection fraction greater was greater than 60%.  His end-diastolic  pressure was 8.   CORONARY ARTERIOGRAPHY:  Multiple stents  were seen in the distribution  of the right coronary artery and in the LAD.  1. Left main:  Normal.  2. Circumflex:  The circumflex gave rise to basically three large OM      vessels, all of which were free of disease.  3. LAD:  There was a Zeta stent in the proximal LAD.  It was widely      patent.  The ongoing LAD extended to the apex of the heart.  There      was myocardial bridging in the distal portion of the mid segment.      There were three small diagonal branches free of disease.  4. Right coronary artery:  The right coronary artery had multiple      stents.  In the ostium was a 3.0 x 12 Taxus stent.  Overlapping      this was a 3.0 x 20 Taxus stent.  Overlapping this was a series of      3.0 x 24 Taxus stents and an additional Cypher stent size unknown.      In the distal portion of the vessel there was a 2.5 x 20 stent,  questionable Zeta.   At the ostium of the right coronary artery was a area of 80% narrowing.  It extended from the ostium down proximally about 12 mm.  In the mid  portion of the RCA still within the long stented segment was another  focal area of 80% narrowing.  In the distal portion of the vessel now in  the very most distal stent was an area of 70% narrowing.  The PDA and  posterolateral vessels were relatively small but free of disease.   In view of the in-stent restenosis, arrangements were made for  intervention.  The 6-French sheath was upgraded to a 6-French system.  The patient was given 300 mg of oral Plavix and started on IV Angiomax.  A JL-4 6-French guide catheter was used and a short Luge wire was placed  down the RCA in the PDA.  A 3.0 x 15 cutting balloon was made ready and  a series of three inflations was done proximally, and at the end of the  procedure for additional inflations were done in the ostium with the  guide catheter backed out of the RCA to make certain that the ostial  area was well covered.  These inflations ranged from 8  atmospheres for  55 seconds to 6 atmospheres for 45 seconds.  This proximal segment had  been 80% narrowed initially and was now widely patent with no  obstruction.   The mid portion was then addressed with the same cutting balloon.  Four  inflations were performed.  The area that was 80% narrowed now appeared  to be normal.   The balloon was then placed in the distal stent.  Of note, this stent  was not connected to the previous long overlapping stent segments.  Two  inflations were performed at 6 atmospheres for approximately 45 seconds.  With this the 70% area that was narrowed appeared to be normal also.  There was no evidence of any dissection or thrombus formation or distal  embolization.  There was brisk TIMI-III flow.   The patient had angina with each inflation and had ST-segment elevation  with each inflation that resolved with deflating the balloon.  The  Integrilin was discontinued.  He will be continued on Plavix, and he  should be ready for discharge tomorrow.           ______________________________  Thereasa Solo Little, M.D.     ABL/MEDQ  D:  07/10/2007  T:  07/11/2007  Job:  [id number]   cc:   Petra Kuba, M.D.  Richard A. Alanda Amass, M.D.  Cath Lab

## 2011-05-09 NOTE — Discharge Summary (Signed)
[name], Jonathan [name]                ACCOUNT NO.:  [id number]   MEDICAL RECORD NO.:  [id number]          PATIENT TYPE:  INP   LOCATION:  2022                         FACILITY:  [facility]   PHYSICIAN:  Doree Fudge, PA DATE OF BIRTH:  1951-10-06   DATE OF ADMISSION:  06/08/2008  DATE OF DISCHARGE:  06/17/2008                               DISCHARGE SUMMARY   ADDENDUM   HOSPITAL COURSE STAY SINCE THE PREVIOUS DICTATION:  The patient was  unable to void early in the morning of postoperative day #4, a bladder  scan was done.  He had 1450 mL.  A Foley was inserted.  He remained  afebrile and hemodynamically stable.  He finished with his diuresis and  he was below his preoperative weight.  The Foley was removed at midnight  on June 15, 2008.  The patient was able to void the following morning  approximately 300 mL.  He did have some irritation, however, upon  voiding.  Later that morning, he was unable to void, he was bladder  scanned again.  He had another 800 mL.  The Foley was reinserted. A UA  and CNS was sent.  Flomax was begun and the urology consult was  obtained.  It should be noted that the urinalysis was essentially  negative and the urine culture showed no growth.  The patient is going  to be sent home with a Foley and Dr. Lenoria Chime office will contact him  regarding follow up within 1 week.   PHYSICAL EXAMINATION:  GENERAL:  Currently, the patient is afebrile.  VITAL SIGNS:  Stable.  CARDIOVASCULAR:  Regular rate and rhythm.  PULMONARY:  Clear to auscultation.  No rales, wheezes, or rhonchi.  EXTREMITIES:  No edema.  Wounds are clean and dry.  There is some slight  erythema along the incision line of the right upper extremity.  Motor  and sensory are intact.   The patient is going to be discharged today.   ADDITIONAL DISCHARGE MEDICATIONS:  1. Flomax 0.4 mg p.o. daily.  2. Septra 1 p.o. daily x6 days.   ADDITIONAL WOUND CARE:  The patient was instructed to contact the  office  if there is increasing redness or tenderness to the right upper  extremity of if he experiences any fever or chills.      Doree Fudge, PA     DZ/MEDQ  D:  06/17/2008  T:  06/18/2008  Job:  [id number]   cc:   Gerlene Burdock A. Alanda Amass, M.D.

## 2011-05-09 NOTE — Discharge Summary (Signed)
[name], [name]                ACCOUNT NO.:  [id number]   MEDICAL RECORD NO.:  [id number]          PATIENT TYPE:  INP   LOCATION:  6522                         FACILITY:  [facility]   PHYSICIAN:  Thereasa Solo. Little, M.D. DATE OF BIRTH:  [birth year]   DATE OF ADMISSION:  07/09/2007  DATE OF DISCHARGE:  07/11/2007                               DISCHARGE SUMMARY   REASON FOR ADMISSION:  [name] is a 60 year old male, a patient of Dr.  Susa Griffins with known coronary artery disease, with multiple PCIs  to his LAD and RCA and stenting, and he has also peripheral vascular  disease with PTCAs bilateral iliac.  He has hypertension and  dyslipidemia.  He came in through the emergency room with complaints of  chest pain and it was decided he should be admitted.   HOSPITAL COURSE:  He was put on IV heparin, and he underwent cardiac  catheterization by Dr. Clarene Duke on July 10, 2007.  He was found to have in-  stent restenosis of his RCA throughout the stent, ostial, mid and distal  of his overlapping stents.  His LAD stent proximal was okay.  He  underwent a cutting balloon angioplasty.  He received Angiomax and  Plavix 600 mg.  The following day he was again seen by Dr. Clarene Duke.  He  was considered in stable condition.  He had no hematoma of his  catheterization site.  He was ambulating without any problems.  His  hemoglobin was 13.5, his BUN was 7, creatinine was 0.87, his potassium  was 3.7, his troponin was 0.09, his CK-MB was 62/2.  It was felt he was  stable to be discharged home, to follow up with Dr. Alanda Amass in three  weeks.   MEDICATIONS ON DISCHARGE:  1. Plavix 75 mg per day.  2. Toprol XL 100 mg per day.  3. Aspirin 81 mg per day.  4. Zocor 40 mg at bedtime.  5. Altace 5 mg per day.  6. Nexium 40 mg per day.   DISPOSITION:  Chest x-ray showed no acute cardiopulmonary disease.  Dr.  Clarene Duke did discuss with him discharge medications, activity and smoking.   DISCHARGE  DIAGNOSIS:  1. Unstable angina.  2. Progressive coronary artery disease with in-stent restenosis of his      right coronary artery overlapping stents, with subsequent cutting      balloon angioplasty by Dr. Julieanne Manson.  3. Hypertension.  4. Hyperlipidemia.  5. Atherosclerotic peripheral vascular disease.  6. Gastroesophageal reflux disease.      Lezlie Octave, N.P.    ______________________________  Thereasa Solo. Little, M.D.    BB/MEDQ  D:  07/11/2007  T:  07/11/2007  Job:  [id number]   cc:   [place], M.D.

## 2011-05-09 NOTE — Discharge Summary (Signed)
Jonathan [name], Jonathan [name]                ACCOUNT NO.:  [id number]   MEDICAL RECORD NO.:  [id number]          PATIENT TYPE:  INP   LOCATION:  2022                         FACILITY:  [facility]   PHYSICIAN:  Evelene Croon, M.D.     DATE OF BIRTH:  1951/03/13   DATE OF ADMISSION:  06/08/2008  DATE OF DISCHARGE:                               DISCHARGE SUMMARY   FINAL DIAGNOSIS:  Two-vessel coronary artery disease with high-grade in-  stent restenosis in the right coronary artery.   IN-HOSPITAL DIAGNOSES:  1. Mild volume overload postoperatively.  2. Acute blood loss anemia postoperatively.   SECONDARY DIAGNOSES:  1. Hypertension.  2. Hyperlipidemia.  3. Coronary artery disease status post multiple percutaneous      interventions.  4. History of peripheral vascular disease status post stenting of both      common iliac arteries.  5. History of femoral artery stenosis.  6. Status post lower back surgery in the past.   IN-HOSPITAL OPERATIONS AND PROCEDURES:  1. Cardiac catheterization.  2. Coronary artery bypass grafting x1 using a free right radial artery      graft to posterior descending coronary artery.   HISTORY AND PHYSICAL/HOSPITAL COURSE:  The patient's is a 60 year old  gentleman with history of coronary artery disease status post multiple  percutaneous interventions on coronary artery with stents, cutting  balloon angioplasty, brachytherapy as well as stenting of the proximal  LAD in 2003.  The patient now presents with crescendo anginal symptoms.  Cardiac catheterization done showed severe in-stent restenosis within  the right coronary artery proximally and in the midportion.  There is  also some distal right coronary artery up to the takeoff of the  posterior descending branch which had no significant stenosis within  that.  Following catheterization, Dr. Laneta Simmers was consulted.  Dr. Laneta Simmers  discussed with the patient undergoing coronary artery bypass grafting.  He discussed risks  and benefits.  The patient also understanding agreed  to proceed.  Surgery was scheduled for June 11, 2008.  Preoperatively,  he had bilateral carotid duplex ultrasound done showing no significant  ICA stenosis.  He also had preoperative ABIs done showing on the right  to be 1.09 and on left to be 1.24.  For further details, please see the  past medical history and physical exam and please see dictated H&P.  The  patient remained stable preoperatively.   He was taken to the operating room on June 11, 2008 where he underwent  coronary bypass grafting x1 using the free right radial artery graft to  posterior descending coronary artery.  The patient tolerated this  procedure well and was transferred to the intensive care unit in stable  condition.  Postoperatively, the patient was noted to be hemodynamically  stable.  He was extubated on the evening of surgery.  Post extubation,  the patient was noted to be alert and oriented x4.  Neuro intact.  The  patient's postoperative course was pretty much unremarkable.  Followup  chest tubes and lines were discontinued postop day #1 and he was  transferred out to the PACU.  The patient remained in normal sinus  rhythm postoperatively.  He was restarted on his beta blocker as well as  ACE inhibitor.  Blood pressure and heart rate were stable prior to  discharge.  Most recent chest x-ray was clear.  He was able to be weaned  off oxygen saturating greater than 90% on room air.  He did have mild  acute blood loss anemia postoperatively with hemoglobin and hematocrit  at 9.6 and 28% postop day #2.  The patient was asymptomatic and this was  followed.  No transfusions were required.  The patient also had mild  volume overload postoperatively and was started on diuretics.  He was  back near baseline weight prior to discharge home.  Postoperatively, the  patient was out of bed ambulating well without difficulty.  He was  tolerating diet well.  No nausea and  vomiting noted.  All incisions were  clean, dry, and intact and healing well.   On postop day #3, the patient was noted to be afebrile.  He was in  normal sinus rhythm.  Blood pressure and heart rate were stable.  He was  saturating greater than 9% on room air.  He was tentatively ready for  discharge home in the a.m. pending he remained stable.   FOLLOWUP APPOINTMENTS:  A followup appointment will be arranged with Dr.  Laneta Simmers in 3 weeks.  Our office will contact the patient with this  information.  The patient needs to obtain PA and lateral chest x-ray 30  minutes prior to this appointment.  He will need to follow up with Dr.  Alanda Amass in 2 weeks.  He will need to contact his office to make these  arrangements.   ACTIVITY:  The patient instructed no driving until released to do so.  No lifting over 10 pounds.  He is told to ambulate 3-4 times per day,  progress as tolerated, and to continue his breathing exercises.   INCISIONAL CARE:  The patient is told to shower washing his incisions  using soap and water.  He is to contact the office if he develops any  drainage or opening from any of his incision sites.   DIET:  The patient educated on diet to be low-fat, low-salt.   DISCHARGE MEDICATIONS:  1. Simvastatin 80 mg at night.  2. Aspirin 325 mg daily.  3. Plavix 75 mg daily.  4. Altace 5 mg daily.  5. Toprol XL 50 mg daily.  6. Nexium 40 mg daily.  7. Imdur 15 mg daily.  8. Lasix 40 mg daily x3 days.  9. Potassium chloride 20 mEq daily x3 days.  10.Oxycodone 5 mg 1-2 tabs q. 4-6 hours p.r.n.      Theda Belfast, [place]      Evelene Croon, M.D.  Electronically Signed   KMD/MEDQ  D:  06/14/2008  T:  06/14/2008  Job:  [id number]   cc:   Gerlene Burdock A. Alanda Amass, M.D.

## 2011-05-09 NOTE — Consult Note (Signed)
NAMESYLAS, TWOMBLY                ACCOUNT NO.:  [id number]   MEDICAL RECORD NO.:  [id number]          PATIENT TYPE:  INP   LOCATION:  2022                         FACILITY:  [facility]   PHYSICIAN:  Bertram Millard. Dahlstedt, M.D.DATE OF BIRTH:  [birth year]   DATE OF CONSULTATION:  06/16/2008  DATE OF DISCHARGE:                                 CONSULTATION   REASON FOR CONSULTATION:  Urinary retention.   BRIEF HISTORY:  This 60 year old male is status post CABG on June 11, 2008.  He has had retention postoperatively, and has had residuals of up  to 800 mL.  He does have urgency.  He has had some dysuria as well.  A  catheter was placed recently.  He is set for possible discharge tomorrow  morning.  He was started on Flomax this evening.   He has had some mild voiding symptoms in the past.  Dr. Regino Schultze in  [place] has placed him on a sample of Flomax, although he does not  necessary think this has helped a lot.  He denies any longstanding  history of urinary tract infections.  He denies any gross hematuria.   PAST MEDICAL HISTORY:  Significant for:  1. Coronary artery disease.  He has had prior stents by Dr. Alanda Amass.  2. He has peripheral vascular disease.  3. Hypertension.  4. Hyperlipidemia.  5. GERD.   MEDICATIONS:  Medications were reviewed.   SOCIAL HISTORY:  The patient is married, retired, on disability from his  heart.  He did work at [employer].   PHYSICAL EXAMINATION:  GENERAL:  Revealed a pleasant middle aged male.  GU:  Foley catheter was present at his meatus.  There was some blood  drainage around this.  Phallus was circumcised.  Scrotal skin, testicle,  cords, and epididymal structures are normal.  No inguinal hernias.  Normal anal sphincter tone.  Gland 40-50 g, symmetrical, non-nodular,  and nontender.  No rectal mass.  Seminal vesicles nonpalpable.   IMPRESSION:  1. Urinary retention.  May be multifactorial, but he does have some      prior voiding  symptoms.  2. Benign prostatic hypertrophy by examination.  He has a catheter in      place.   PLAN:  1. I would continue Flomax - I gave him a prescription of that to go      home on.  2. Continue Foley catheterization.  3. I am okay with the discharge tomorrow - We will call to set up an      appointment for me to see him next week for voiding trial.      Bertram Millard. Dahlstedt, M.D.  Electronically Signed     SMD/MEDQ  D:  06/16/2008  T:  06/17/2008  Job:  [id number]   cc:   Kirk Ruths, M.D.  Evelene Croon, M.D.  Richard A. Alanda Amass, M.D.

## 2011-05-12 NOTE — Procedures (Signed)
[place]  Patient:    Jonathan [name], Jonathan [name] Visit Number: [id number] MRN: [id number]          Service Type: DSU Location: [phone] Attending Physician:  Ruta Hinds Dictated by:   Pearletha Furl Alanda Amass, M.D. Proc. Date: 01/30/02 Admit Date:  01/30/2002 Discharge Date: 01/31/2002   CC:         Sixth Floor PV Lab  CP Lab  Runell Gess, M.D.  Karleen Hampshire, M.D., c/o [facility], [place], [place]   Procedure Report  PERIPHERAL ANGIOGRAM  PROCEDURES: 1. Retrograde abdominal aortic catheterization. 2. Abdominal aortic angiogram, midstream PA projection. 3. Bilateral lower extremity angiography by digital cut film imaging. 4. Bilateral iliac angiography, PA and oblique projections. 5. Heparin 3000 units IV. 6. Percutaneous transluminal angioplasty and stenting by contralateral    technique, high-grade left common iliac artery stenosis. 6. Percutaneous transluminal angioplasty and stent, retrograde technique,    right femoral artery, high-grade right common iliac artery stenosis.  DESCRIPTION OF PROCEDURE:  The patient was brought to the sixth floor CP lab in the postabsorptive state after premedication with 5 mg Valium p.o.  He was given 4 mg of Nubain for sedation and 2 mg of Versed IV.  The right groin was prepped, draped in the usual manner, and 1% was used for local anesthesia and the RFA was entered with a single anterior puncture using an 18 thin-walled needle.  A 6 French short Cordis sidearm sheath was inserted without difficulty.  A Wholey wire was used to traverse the diseased iliac system. Abdominal aortic angiogram was done in the midstream PA projection at 25 cc, 20 cc per second, above the level of the renal arteries.  Lower extremity runoff was done with a distal aortic injection of 88 cc, 8 cc per second, with step table bolus-chase DSA imaging and visualization to the feet bilaterally. Left iliac  angiography was done by a contralateral 5 [place] IMA catheter.  The patient tolerated the diagnostic procedure well.  Arterial pressures were monitored throughout the procedure and remained 140-150 mmHg.  The patient maintained sinus rhythm.  BRIEF HISTORY:  Mr. Hechler is a 60 year old married father of two with one grandchild, who works full-time at [employer].  He recently discontinued smoking.  He suffered a DMI treated with emergency PTCA and stenting of the proximal distal RCA on December 03, 2001.  He was recently hospitalized for recurrent chest pain compatible with ischemia.  He was found to have in-stent restenosis of approximately 60% proximal and 70% peri-stent distal RCA and progression of high-grade LAD stenosis to 90% proximal.  He underwent cutting balloon angioplasty of the proximal RCA stent, tandem 3.08 Zeta overlapping distal stent with cutting balloon angioplasty.  He underwent primary stenting of his LAD.  At that time he was found to have bilateral high-grade eccentric ulcerative common iliac stenosis.  He has a history of bilateral claudication. He has done well since his coronary PCI on January 27, 2002, and was admitted for elective lower extremity evaluation and intervention in the setting of Fontain IIB bilateral claudication.  ANGIOGRAMS:  Abdominal aortic angiogram demonstrated a patent celiac and SMA axis.  The renal arteries were single and normal bilaterally.  The infrarenal abdominal aorta had minimal atherosclerotic disease with no significant aneurysm or stenosis.  The LCIA had mildly segmental eccentric 85% stenosis.  The left hypogastric filled late and may have been occluded with late filling.  The RCIA had 90% eccentric ulcerative  stenosis.  This was segmental and ended just before the right hypogastric, which was intact.  The external iliacs bilaterally had no significant stenosis, but there was 30% narrowing in the midportion of each one.   The common femorals bilaterally had no significant stenosis.  The profunda-SFA junction was normal bilaterally.  There were bilateral irregularities of 30-40% distal right SFA and 30% distal left SFA narrowing at [facility] canal.  The popliteals were normal.  The tibial trifurcation was intact bilaterally with irregularities but no significant stenosis, and there was three-vessel runoff to the feet bilaterally, although there was slow flow on each side compatible with inflow obstruction.  It was elected to proceed with intervention in the setting of high-grade bilateral iliac stenosis and symptomatic claudication.  The left common iliac was entered with a 5 [place] IMA catheter and a Wholey wire.  A 6 French crossover Balkan sheath was then advanced over the wire, positioned in the proximal LCIA.  The [facility] wire was across the stenosis. The lesion was then crossed with a self-expanding nitinol Cordis 9 x 40 mm SmartStent, which was positioned fluoroscopically and deployed under fluoroscopic control with the sheath withdrawn.  The dilatation system was removed and replaced with an 8 mm x 4 cm Cordis Powerflex balloon, which was used to postdilate the LCIA stent at 8-45.  There was some residual stenosis, and repeat inflation was done at 12 atmospheres for 40 seconds.  The stenosis was reduced from an 85% eccentric to less than 10%.  Good residual lumen, no dissection, and good flow resulted.  The Balkan sheath was then pulled back over the dilator and positioned below the RCIA stenosis.  This lesion was then primarily stented with a Genesis 8 x 3.39 mm stent, premounted on a Cordis Opta balloon.  It was positioned across the stenosis, deployed with the sheath pulled back under fluoroscopic control at balloon  expandable at 8 mm for 40 seconds.  It was then postdilated to 12 atmospheres for 40 seconds with the same balloon.  There was good, excellent stent expansion with 0% residual  stenosis.  There was some minimal flow through an ulcerative lesion in the proximal portion of the stent that was not significant hemodynamically or angiographically.  There was good flow through the right iliac system.  The sheath was then pulled back and exchanged for a short 6 French sidearm sheath.  The patient was brought to the holding area for ACT measurement and sheath removal with pressure hemostasis when appropriate.  He tolerated the procedure well.  FINAL RESULT:  The patient has had successful bilateral iliac PCA and stenting by contralateral and ipsilateral technique through the CRFA.  He has recently had RCA intervention and LAD intervention January 27, 2002, as outlined.  He will be continued on aspirin and Plavix, continued on lipid-lowering agents, low-dose ACE inhibitors, and beta blockers, which he is tolerating. Fortunately, he has discontinued smoking.  We will obtain follow-up Dopplers as an outpatient.  CATHETERIZATION DIAGNOSES: 1. Peripheral arterial disease (PAD), bilateral Fontain IIB claudication    with high-grade bilateral common iliac disease. 2. Successful percutaneous transluminal angioplasty and stent, self-expanding    nitinol Cordis SmartStent, left common iliac artery. 3. Successful primary percutaneous transluminal angioplasty and stent with    balloon expandable Genesis Cordis right common iliac artery stent. 4. Minor residual distal superficial femoral artery disease bilaterally with    three-vessel runoff. 5. Coronary artery disease.    a. Status post DMI December 03, 2001, treated with  emergency percutaneous       transluminal coronary angioplasty and stent, proximal and distal right       coronary artery.    b. In-stent restenosis, proximal and distal right coronary artery, treated       with cutting balloon percutaneous transluminal coronary angioplasty and       tandem distal stenting January 27, 2002.    c. Primary stenting high-grade  proximal left anterior descending stenosis       for recurrent angina and progression of disease January 27, 2002. 6. Hyperlipidemia. 7. Past cigarette abuse. 8. Mild hypertension. 9. Gastroesophageal reflux disease. Dictated by:   Pearletha Furl Alanda Amass, M.D. Attending Physician:  Ruta Hinds DD:  01/30/02 TD:  01/31/02 Job: [phone] [id number]

## 2011-09-21 LAB — POCT I-STAT 3, ART BLOOD GAS (G3+)
Acid-Base Excess: 3 — ABNORMAL HIGH
Acid-base deficit: 1
Acid-base deficit: 2
Bicarbonate: 24.4 — ABNORMAL HIGH
Bicarbonate: 28.3 — ABNORMAL HIGH
O2 Saturation: 95
O2 Saturation: 98
Operator id: 262201
Patient temperature: 36
Patient temperature: 36.5
TCO2: 25
TCO2: 26
TCO2: 30

## 2011-09-21 LAB — BASIC METABOLIC PANEL
BUN: 11
CO2: 26
CO2: 26
CO2: 28
Calcium: 8.2 — ABNORMAL LOW
Calcium: 8.7
Calcium: 9.1
Chloride: 104
Chloride: 107
Chloride: 108
Creatinine, Ser: 0.78
GFR calc Af Amer: >60
GFR calc Af Amer: >60
GFR calc non Af Amer: >60
GFR calc non Af Amer: >60
Glucose, Bld: 100 — ABNORMAL HIGH
Glucose, Bld: 113 — ABNORMAL HIGH
Glucose, Bld: 114 — ABNORMAL HIGH
Glucose, Bld: 92
Potassium: 3.8
Potassium: 3.9
Potassium: 4
Sodium: 138
Sodium: 139
Sodium: 142

## 2011-09-21 LAB — POCT I-STAT, CHEM 8
Calcium, Ion: 1.14
Creatinine, Ser: 0.9
Glucose, Bld: 119 — ABNORMAL HIGH
HCT: 33 — ABNORMAL LOW
Hemoglobin: 11.2 — ABNORMAL LOW
Potassium: 3.8
TCO2: 23

## 2011-09-21 LAB — POCT I-STAT 4, (NA,K, GLUC, HGB,HCT)
Glucose, Bld: 113 — ABNORMAL HIGH
Glucose, Bld: 93
Glucose, Bld: 97
Glucose, Bld: 99
HCT: 24 — ABNORMAL LOW
HCT: 36 — ABNORMAL LOW
Hemoglobin: 11.6 — ABNORMAL LOW
Hemoglobin: 8.2 — ABNORMAL LOW
Operator id: 198871
Operator id: 3406
Operator id: 3406
Potassium: 3.7
Potassium: 4.1
Potassium: 4.4
Sodium: 133 — ABNORMAL LOW
Sodium: 138
Sodium: 139
Sodium: 141

## 2011-09-21 LAB — BLOOD GAS, ARTERIAL
Bicarbonate: 24.4 — ABNORMAL HIGH
FIO2: 0.21
O2 Saturation: 94.5
pO2, Arterial: 69.2 — ABNORMAL LOW

## 2011-09-21 LAB — CBC
HCT: 28 — ABNORMAL LOW
HCT: 35 — ABNORMAL LOW
HCT: 39.7
Hemoglobin: 10.3 — ABNORMAL LOW
Hemoglobin: 12.1 — ABNORMAL LOW
Hemoglobin: 14.1
Hemoglobin: 14.2
Hemoglobin: 9.6 — ABNORMAL LOW
Hemoglobin: 9.9 — ABNORMAL LOW
MCHC: 34.2
MCHC: 34.6
MCHC: 35.4
MCHC: 35.4
MCV: 88.2
Platelets: 106 — ABNORMAL LOW
Platelets: 182
Platelets: 190
RBC: 3.17 — ABNORMAL LOW
RBC: 3.4 — ABNORMAL LOW
RBC: 3.99 — ABNORMAL LOW
RBC: 4.43
RBC: 4.5
RDW: 13.6
RDW: 13.6
RDW: 13.9
RDW: 13.9
WBC: 7
WBC: 7.5

## 2011-09-21 LAB — CREATININE, SERUM
Creatinine, Ser: 0.78
GFR calc Af Amer: >60

## 2011-09-21 LAB — URINALYSIS, ROUTINE W REFLEX MICROSCOPIC
Glucose, UA: NEGATIVE
Hgb urine dipstick: NEGATIVE
Hgb urine dipstick: NEGATIVE
Nitrite: NEGATIVE
Specific Gravity, Urine: 1.008
Specific Gravity, Urine: 1.015
Urobilinogen, UA: 1
Urobilinogen, UA: 1

## 2011-09-21 LAB — ABO/RH: ABO/RH(D): O POS

## 2011-09-21 LAB — URINE CULTURE
Culture: NO GROWTH
Culture: NO GROWTH

## 2011-09-21 LAB — LIPID PANEL: HDL: 30 — ABNORMAL LOW

## 2011-09-21 LAB — APTT
aPTT: 104 — ABNORMAL HIGH
aPTT: 31

## 2011-09-21 LAB — TYPE AND SCREEN

## 2011-09-21 LAB — COMPREHENSIVE METABOLIC PANEL
ALT: 26
AST: 20
CO2: 27
Chloride: 110
GFR calc Af Amer: >60
GFR calc non Af Amer: >60
Sodium: 142
Total Bilirubin: 1.1

## 2011-09-21 LAB — HEMOGLOBIN A1C: Mean Plasma Glucose: 115

## 2011-09-21 LAB — HEPARIN LEVEL (UNFRACTIONATED)
Heparin Unfractionated: 0.36
Heparin Unfractionated: 0.5
Heparin Unfractionated: 0.6

## 2011-10-09 LAB — CBC
HCT: 42.8
Hemoglobin: 14.9
MCHC: 34.4
MCHC: 34.5
MCV: 89.8
Platelets: 181
Platelets: 188
RBC: 4.35
RBC: 4.85
RDW: 13.6
RDW: 13.6

## 2011-10-09 LAB — I-STAT 8, (EC8 V) (CONVERTED LAB)
Acid-base deficit: 1
Bicarbonate: 24.1 — ABNORMAL HIGH
Glucose, Bld: 118 — ABNORMAL HIGH
HCT: 47
Hemoglobin: 16
Operator id: 189501
Potassium: 4
Sodium: 136
TCO2: 25

## 2011-10-09 LAB — COMPREHENSIVE METABOLIC PANEL
Albumin: 3.5
Alkaline Phosphatase: 80
BUN: 6
Creatinine, Ser: 0.85
Glucose, Bld: 108 — ABNORMAL HIGH
Potassium: 3.7
Total Bilirubin: 1.1
Total Protein: 6.4

## 2011-10-09 LAB — TROPONIN I: Troponin I: 0.02

## 2011-10-09 LAB — BASIC METABOLIC PANEL
BUN: 7
Creatinine, Ser: 0.87
GFR calc Af Amer: >60
GFR calc non Af Amer: >60

## 2011-10-09 LAB — DIFFERENTIAL
Basophils Absolute: 0.1
Eosinophils Relative: 4
Lymphocytes Relative: 22
Monocytes Absolute: 0.7
Monocytes Relative: 8

## 2011-10-09 LAB — CARDIAC PANEL(CRET KIN+CKTOT+MB+TROPI)
CK, MB: 1.9
CK, MB: 2.1
Relative Index: INVALID
Relative Index: INVALID
Total CK: 96
Troponin I: 0.01
Troponin I: 0.02
Troponin I: 0.09 — ABNORMAL HIGH

## 2011-10-09 LAB — POCT I-STAT CREATININE
Creatinine, Ser: 0.9
Operator id: 189501

## 2011-10-09 LAB — APTT: aPTT: 24

## 2011-10-09 LAB — CK TOTAL AND CKMB (NOT AT ARMC)
CK, MB: 2.2
Total CK: 99

## 2011-10-09 LAB — POCT CARDIAC MARKERS: Troponin i, poc: <0.05

## 2011-10-09 LAB — HEPARIN LEVEL (UNFRACTIONATED): Heparin Unfractionated: <0.1 — ABNORMAL LOW

## 2011-10-09 LAB — MAGNESIUM: Magnesium: 2.1

## 2011-12-21 ENCOUNTER — Other Ambulatory Visit: Payer: Self-pay | Admitting: Neurosurgery

## 2011-12-21 DIAGNOSIS — M47817 Spondylosis without myelopathy or radiculopathy, lumbosacral region: Secondary | ICD-10-CM

## 2012-01-01 ENCOUNTER — Ambulatory Visit
Admission: RE | Admit: 2012-01-01 | Discharge: 2012-01-01 | Disposition: A | Discharge status: Home / Self Care | Payer: Medicare Other | Source: Ambulatory Visit | Attending: Neurosurgery | Admitting: Neurosurgery

## 2012-01-01 DIAGNOSIS — M47817 Spondylosis without myelopathy or radiculopathy, lumbosacral region: Secondary | ICD-10-CM

## 2012-01-01 MED ORDER — GADOBENATE DIMEGLUMINE 529 MG/ML IV SOLN
17.0000 mL | Freq: Once | INTRAVENOUS | Status: AC | PRN
  Administered 2012-01-01: 17 mL via INTRAVENOUS

## 2012-01-19 ENCOUNTER — Other Ambulatory Visit (HOSPITAL_COMMUNITY): Payer: Self-pay | Admitting: Neurosurgery

## 2012-01-19 DIAGNOSIS — M545 Low back pain: Secondary | ICD-10-CM

## 2012-02-07 ENCOUNTER — Other Ambulatory Visit: Payer: Self-pay | Admitting: Neurosurgery

## 2012-02-23 ENCOUNTER — Inpatient Hospital Stay (HOSPITAL_COMMUNITY): Admission: RE | Admit: 2012-02-23 | Payer: Medicare Other | Source: Ambulatory Visit

## 2012-02-23 ENCOUNTER — Ambulatory Visit (HOSPITAL_COMMUNITY): Payer: Medicare Other

## 2012-11-25 ENCOUNTER — Other Ambulatory Visit (HOSPITAL_COMMUNITY): Payer: Self-pay | Admitting: Cardiovascular Disease

## 2012-11-25 DIAGNOSIS — R011 Cardiac murmur, unspecified: Secondary | ICD-10-CM

## 2012-11-25 DIAGNOSIS — I739 Peripheral vascular disease, unspecified: Secondary | ICD-10-CM

## 2012-12-06 ENCOUNTER — Encounter (HOSPITAL_COMMUNITY): Payer: Medicare Other

## 2012-12-11 ENCOUNTER — Inpatient Hospital Stay (HOSPITAL_COMMUNITY): Admission: RE | Admit: 2012-12-11 | Payer: Medicare Other | Source: Ambulatory Visit

## 2012-12-11 ENCOUNTER — Ambulatory Visit (HOSPITAL_COMMUNITY): Payer: Medicare Other

## 2013-02-12 ENCOUNTER — Telehealth: Payer: Self-pay

## 2013-02-12 NOTE — Telephone Encounter (Signed)
Pt referred by Dr. Regino Schultze for screening colonoscopy. LMOM to call.

## 2013-02-18 NOTE — Telephone Encounter (Signed)
L/M to call.

## 2013-02-20 NOTE — Telephone Encounter (Signed)
Letter to PCP and pt.  

## 2013-02-22 ENCOUNTER — Emergency Department (HOSPITAL_COMMUNITY)
Admission: EM | Admit: 2013-02-22 | Discharge: 2013-02-22 | Disposition: A | Discharge status: Home / Self Care | Payer: Medicare Other | Attending: Emergency Medicine | Admitting: Emergency Medicine

## 2013-02-22 ENCOUNTER — Emergency Department (HOSPITAL_COMMUNITY): Payer: Medicare Other

## 2013-02-22 ENCOUNTER — Encounter (HOSPITAL_COMMUNITY): Payer: Self-pay | Admitting: Emergency Medicine

## 2013-02-22 DIAGNOSIS — Z7982 Long term (current) use of aspirin: Secondary | ICD-10-CM | POA: Insufficient documentation

## 2013-02-22 DIAGNOSIS — Z79899 Other long term (current) drug therapy: Secondary | ICD-10-CM | POA: Insufficient documentation

## 2013-02-22 DIAGNOSIS — I10 Essential (primary) hypertension: Secondary | ICD-10-CM | POA: Insufficient documentation

## 2013-02-22 DIAGNOSIS — R55 Syncope and collapse: Secondary | ICD-10-CM | POA: Insufficient documentation

## 2013-02-22 DIAGNOSIS — R42 Dizziness and giddiness: Secondary | ICD-10-CM | POA: Insufficient documentation

## 2013-02-22 DIAGNOSIS — R110 Nausea: Secondary | ICD-10-CM | POA: Insufficient documentation

## 2013-02-22 DIAGNOSIS — F17200 Nicotine dependence, unspecified, uncomplicated: Secondary | ICD-10-CM | POA: Insufficient documentation

## 2013-02-22 DIAGNOSIS — Z87448 Personal history of other diseases of urinary system: Secondary | ICD-10-CM | POA: Insufficient documentation

## 2013-02-22 DIAGNOSIS — R0602 Shortness of breath: Secondary | ICD-10-CM | POA: Insufficient documentation

## 2013-02-22 DIAGNOSIS — K219 Gastro-esophageal reflux disease without esophagitis: Secondary | ICD-10-CM | POA: Insufficient documentation

## 2013-02-22 HISTORY — DX: Other obstructive and reflux uropathy: N40.1

## 2013-02-22 HISTORY — DX: Essential (primary) hypertension: I10

## 2013-02-22 HISTORY — DX: Gastro-esophageal reflux disease without esophagitis: K21.9

## 2013-02-22 HISTORY — DX: Other obstructive and reflux uropathy: N13.8

## 2013-02-22 LAB — POCT I-STAT, CHEM 8
Calcium, Ion: 1.18 mmol/L (ref 1.13–1.30)
Creatinine, Ser: 1 mg/dL (ref 0.50–1.35)
Glucose, Bld: 91 mg/dL (ref 70–99)
Hemoglobin: 13.6 g/dL (ref 13.0–17.0)
TCO2: 25 mmol/L (ref 0–100)

## 2013-02-22 LAB — CBC WITH DIFFERENTIAL/PLATELET
Basophils Absolute: 0 10*3/uL (ref 0.0–0.1)
Basophils Relative: 0 % (ref 0–1)
Eosinophils Absolute: 0.2 10*3/uL (ref 0.0–0.7)
Eosinophils Relative: 2 % (ref 0–5)
Lymphocytes Relative: 19 % (ref 12–46)
MCV: 83.9 fL (ref 78.0–100.0)
Platelets: 192 10*3/uL (ref 150–400)
RDW: 13.6 % (ref 11.5–15.5)
WBC: 10.5 10*3/uL (ref 4.0–10.5)

## 2013-02-22 LAB — URINALYSIS, ROUTINE W REFLEX MICROSCOPIC
Bilirubin Urine: NEGATIVE
Hgb urine dipstick: NEGATIVE
Ketones, ur: NEGATIVE mg/dL
Protein, ur: NEGATIVE mg/dL
Urobilinogen, UA: 1 mg/dL (ref 0.0–1.0)

## 2013-02-22 LAB — ETHANOL: Alcohol, Ethyl (B): 17 mg/dL — ABNORMAL HIGH (ref 0–11)

## 2013-02-22 MED ORDER — SODIUM CHLORIDE 0.9 % IV BOLUS (SEPSIS)
500.0000 mL | Freq: Once | INTRAVENOUS | Status: AC
  Administered 2013-02-22: 500 mL via INTRAVENOUS

## 2013-02-22 NOTE — ED Notes (Signed)
[facility] presents with a 62 yo man at a golf facility/playing rounds of golf/admits to have had ETOH while golfing had a sudden onset of weakness, near syncopal episode.  12 lead shows no significant changes/NSR.  Pt had bypass surgery approximately 1-2 years ago.

## 2013-02-22 NOTE — ED Provider Notes (Signed)
History     CSN: [id number]  Arrival date & time 02/22/13  1628   First MD Initiated Contact with Patient 02/22/13 1708      Chief Complaint  Patient presents with  . Near Syncope    (Consider location/radiation/quality/duration/timing/severity/associated sxs/prior treatment) The history is provided by the patient.   patient was golfing he began to feel lightheaded and short of breath. He went to the bathroom. He had nausea. He then felt like his to pass out. Family states she looked pale. He felt that around 30 minutes. No headache. He was a little confused at the time. No shaking. No fevers. No cough. No chest pain. He's had previous coronary artery bypass. No abdominal pain. He states he did feel as if he had to have a bowel movement. Past Medical History  Diagnosis Date  . Hypertension   . Gastroesophageal reflux   . Prostate hyperplasia with urinary obstruction     Past Surgical History  Procedure Laterality Date  . Vein bypass surgery    . Cardiac surgery    . Back surgery      No family history on file.  History  Substance Use Topics  . Smoking status: Light Tobacco Smoker  . Smokeless tobacco: Not on file  . Alcohol Use: Yes      Review of Systems  Constitutional: Negative for activity change and appetite change.  HENT: Negative for neck stiffness.   Eyes: Negative for pain.  Respiratory: Negative for chest tightness and shortness of breath.   Cardiovascular: Negative for chest pain and leg swelling.  Gastrointestinal: Positive for nausea. Negative for vomiting, abdominal pain and diarrhea.  Genitourinary: Negative for flank pain.  Musculoskeletal: Negative for back pain.  Skin: Negative for rash.  Neurological: Positive for light-headedness. Negative for weakness, numbness and headaches.  Psychiatric/Behavioral: Negative for behavioral problems.    Allergies  Review of patient's allergies indicates no known allergies.  Home Medications   Current  Outpatient Rx  Name  Route  Sig  Dispense  Refill  . aspirin 81 MG chewable tablet   Oral   Chew 81 mg by mouth daily.         Marland Kitchen esomeprazole (NEXIUM) 40 MG capsule   Oral   Take 40 mg by mouth daily before breakfast.           BP 139/67  Pulse 82  Temp(Src) 97.7 F (36.5 C) (Oral)  Resp 18  SpO2 99%  Physical Exam  Nursing note and vitals reviewed. Constitutional: He is oriented to person, place, and time. He appears well-developed and well-nourished.  Patient has occasional chills/rigors.  HENT:  Head: Normocephalic and atraumatic.  Eyes: EOM are normal. Pupils are equal, round, and reactive to light.  Neck: Normal range of motion. Neck supple.  Cardiovascular: Normal rate, regular rhythm and normal heart sounds.   No murmur heard. Pulmonary/Chest: Effort normal and breath sounds normal.  Abdominal: Soft. Bowel sounds are normal. He exhibits no distension and no mass. There is no tenderness. There is no rebound and no guarding.  Musculoskeletal: Normal range of motion. He exhibits no edema.  Neurological: He is alert and oriented to person, place, and time. No cranial nerve deficit.  Skin: Skin is warm and dry.  Psychiatric: He has a normal mood and affect.    ED Course  Procedures (including critical care time)  Labs Reviewed  CBC WITH DIFFERENTIAL - Abnormal; Notable for the following:    HCT 38.1 (*)  All other components within normal limits  ETHANOL - Abnormal; Notable for the following:    Alcohol, Ethyl (B) 17 (*)    All other components within normal limits  POCT I-STAT, CHEM 8 - Abnormal; Notable for the following:    Sodium 134 (*)    All other components within normal limits  TROPONIN I  URINALYSIS, ROUTINE W REFLEX MICROSCOPIC   Dg Chest 2 View  02/22/2013  *RADIOLOGY REPORT*  Clinical Data: Near syncope, shortness of breath.  CHEST - 2 VIEW  Comparison: 07/14/2008  Findings: Prior median sternotomy and CABG.  Heart is normal size. Lungs are  clear.  No effusions or acute bony abnormality.  IMPRESSION: No acute cardiopulmonary disease.   Original Report Authenticated By: Charlett Nose, M.D.      1. Near syncope      Date: 02/22/2013  Rate: 75  Rhythm: normal sinus rhythm  QRS Axis: normal  Intervals: normal  ST/T Wave abnormalities: normal  Conduction Disutrbances:none  Narrative Interpretation:   Old EKG Reviewed: none available    MDM  Patient is an episode of near-syncope while out golfing. He feels better now. He is not orthostatic. There is no chest pain with it. No fevers. He was having GI symptoms at the time. This may be a vagal event. He has had previous CABG. Will followup with cardiology to rule out other arrhythmia. Monitoring reassuring.         Juliet Rude. Rubin Payor, MD 02/22/13 2016

## 2013-07-25 HISTORY — PX: NM MYOCAR PERF WALL MOTION: HXRAD629

## 2013-07-25 HISTORY — PX: TRANSTHORACIC ECHOCARDIOGRAM: SHX275

## 2013-07-31 ENCOUNTER — Telehealth (HOSPITAL_COMMUNITY): Payer: Self-pay | Admitting: Cardiovascular Disease

## 2013-07-31 NOTE — Telephone Encounter (Signed)
  [place] [facility] LOCATED AT [facility] [address] [place], [place] [address] [phone]   Date:07/31/2013  Dear Our office has attempted to contact your patient Jonathan [name]  twice by telephone and we have also sent an appointment letter to schedule the Echocardiogram and Arterial Leg  test you ordered. The patient has not responded. We will not make any further attempts to contact the patient. If any further assistance is needed for this referral, please contact our office at [phone]  Sincerely, [facility] Scheduling Team

## 2013-08-06 ENCOUNTER — Other Ambulatory Visit: Payer: Self-pay | Admitting: *Deleted

## 2013-08-11 ENCOUNTER — Encounter: Payer: Self-pay | Admitting: Cardiovascular Disease

## 2013-08-11 ENCOUNTER — Other Ambulatory Visit: Payer: Self-pay | Admitting: *Deleted

## 2013-08-11 DIAGNOSIS — R011 Cardiac murmur, unspecified: Secondary | ICD-10-CM

## 2013-08-11 DIAGNOSIS — R079 Chest pain, unspecified: Secondary | ICD-10-CM

## 2013-08-11 DIAGNOSIS — I2510 Atherosclerotic heart disease of native coronary artery without angina pectoris: Secondary | ICD-10-CM

## 2013-08-15 ENCOUNTER — Encounter: Payer: Self-pay | Admitting: Cardiovascular Disease

## 2013-08-19 ENCOUNTER — Ambulatory Visit (HOSPITAL_COMMUNITY)
Admission: RE | Admit: 2013-08-19 | Discharge: 2013-08-19 | Disposition: A | Discharge status: Home / Self Care | Payer: Medicare Other | Source: Ambulatory Visit | Attending: Cardiology | Admitting: Cardiology

## 2013-08-19 ENCOUNTER — Ambulatory Visit (HOSPITAL_BASED_OUTPATIENT_CLINIC_OR_DEPARTMENT_OTHER)
Admission: RE | Admit: 2013-08-19 | Discharge: 2013-08-19 | Disposition: A | Discharge status: Home / Self Care | Payer: Medicare Other | Source: Ambulatory Visit | Attending: Cardiology | Admitting: Cardiology

## 2013-08-19 DIAGNOSIS — I2510 Atherosclerotic heart disease of native coronary artery without angina pectoris: Secondary | ICD-10-CM | POA: Insufficient documentation

## 2013-08-19 DIAGNOSIS — R079 Chest pain, unspecified: Secondary | ICD-10-CM

## 2013-08-19 DIAGNOSIS — R011 Cardiac murmur, unspecified: Secondary | ICD-10-CM

## 2013-08-19 MED ORDER — TECHNETIUM TC 99M SESTAMIBI GENERIC - CARDIOLITE
30.0000 | Freq: Once | INTRAVENOUS | Status: AC | PRN
  Administered 2013-08-19: 30 via INTRAVENOUS

## 2013-08-19 MED ORDER — TECHNETIUM TC 99M SESTAMIBI GENERIC - CARDIOLITE
10.0000 | Freq: Once | INTRAVENOUS | Status: AC | PRN
  Administered 2013-08-19: 10 via INTRAVENOUS

## 2013-08-19 NOTE — Progress Notes (Signed)
[facility]   2D echo completed 08/19/2013.   Cindy Delos Klich, RDCS  

## 2013-08-19 NOTE — Procedures (Addendum)
[facility] [address] [place] [address] [phone]  Cardiology Nuclear Med Study  Jonathan [name] is a 62 y.o. male     MRN : [id number]     DOB: [birth year]  Procedure Date: 08/19/2013  Nuclear Med Background Indication for Stress Test:  Graft Patency, Stent Patency and Post Hospital History:  CAD;MI;11/2001;CABG--2009;STENT/PTCA--MULTIPLE Cardiac Risk Factors: Family History - CAD, History of Smoking, Hypertension, Lipids, Overweight and PVD  Symptoms:  Chest Pain, Dizziness, DOE, Fatigue, Light-Headedness, Near Syncope and Palpitations   Nuclear Pre-Procedure Caffeine/Decaff Intake:  9:00pm NPO After: 7:00am   IV Site: R Hand  IV 0.9% NS with Angio Cath:  22g  Chest Size (in):  42"  IV Started by: Emmit Pomfret, RN  Height: 5\' 8"  (1.727 m)  Cup Size: n/a  BMI:  Body mass index is 27.07 kg/(m^2). Weight:  178 lb (80.74 kg)   Tech Comments:  N/A    Nuclear Med Study 1 or 2 day study: 1 day  Stress Test Type:  Stress  Order Authorizing Provider:  Susa Griffins, MD   Resting Radionuclide: Technetium 61m Sestamibi  Resting Radionuclide Dose: 10.1 mCi   Stress Radionuclide:  Technetium 92m Sestamibi  Stress Radionuclide Dose: 30.3 mCi           Stress Protocol Rest HR: 66 Stress HR: 148  Rest BP: 139/73 Stress BP: 215/92  Exercise Time (min): 9 METS: 10.1   Predicted Max HR: 158 bpm % Max HR: 93.67 bpm Rate Pressure Product: 21308  Dose of Adenosine (mg):  n/a Dose of Lexiscan: n/a mg  Dose of Atropine (mg): n/a Dose of Dobutamine: n/a mcg/kg/min (at max HR)  Stress Test Technologist: Esperanza Sheets, CCT Nuclear Technologist: Koren Shiver, CNMT   Rest Procedure:  Myocardial perfusion imaging was performed at rest 45 minutes following the intravenous administration of Technetium 74m Sestamibi. Stress Procedure:  The patient performed treadmill exercise using a Bruce  Protocol for 9 minutes. The  patient stopped due to SOB and Leg Fatigue and denied any chest pain.  There were significant ST-T wave changes.  Technetium 32m Sestamibi was injected at peak exercise and myocardial perfusion imaging was performed after a brief delay.  Transient Ischemic Dilatation (Normal <1.22):  0.76 Lung/Heart Ratio (Normal <0.45):  0.37 QGS EDV:  82 ml QGS ESV:  28 ml LV Ejection Fraction: 66%  Rest ECG: NSR - Normal EKG  Stress ECG: Significant ST abnormalities consistent with ischemia.  QPS Raw Data Images:  There is interference from nuclear activity from structures below the diaphragm. This does not affect the ability to read the study. Stress Images:  There is decreased uptake in the inferior wall. Rest Images:  There is decreased uptake in the inferior wall. Subtraction (SDS):  There is a fixed inferior defect that is most consistent with diaphragmatic attenuation.  Impression Exercise Capacity:  Good exercise capacity. BP Response:  Hypertensive blood pressure response. Clinical Symptoms:  Dyspnea, leg discomfort ECG Impression:  3 mm horizontal ST depression in II, III, and AVF at peak exercise Comparison with Prior Nuclear Study: No significant change from previous study  Overall Impression:  Intermediate risk stress nuclear study with abnormal inferior horizontal ST depression of 3mm at peak stress, electrically diagnostic for ischemia. Nuclear images suggest a mild, fixed inferobasal defect with underlying bowel, suggesting attenuation, but no ischemia. Hypertensive response to exercise. Clinical correlation is recommended.  LV Wall Motion:  NL LV Function; NL Wall Motion  [name], MD, [facility] Board Certified in Nuclear Cardiology Attending Cardiologist [facility]  [name], MD  08/19/2013 1:28 PM

## 2013-09-17 ENCOUNTER — Other Ambulatory Visit: Payer: Self-pay | Admitting: Cardiovascular Disease

## 2013-09-17 LAB — LIPID PANEL
Cholesterol: 136 mg/dL (ref 0–200)
HDL: 30 mg/dL — ABNORMAL LOW (ref >39)
Total CHOL/HDL Ratio: 4.5 Ratio
VLDL: 66 mg/dL — ABNORMAL HIGH (ref 0–40)

## 2013-09-25 ENCOUNTER — Other Ambulatory Visit: Payer: Self-pay | Admitting: *Deleted

## 2013-09-25 MED ORDER — RAMIPRIL 10 MG PO CAPS: 10.0000 mg | ORAL_CAPSULE | Freq: Every day | ORAL | Status: DC

## 2013-09-25 NOTE — Telephone Encounter (Signed)
Rx was sent to pharmacy electronically. 

## 2014-03-25 ENCOUNTER — Encounter: Payer: Self-pay | Admitting: *Deleted

## 2014-04-01 ENCOUNTER — Other Ambulatory Visit: Payer: Self-pay | Admitting: *Deleted

## 2014-04-01 MED ORDER — ESOMEPRAZOLE MAGNESIUM 40 MG PO CPDR: 40.0000 mg | DELAYED_RELEASE_CAPSULE | Freq: Every day | ORAL | Status: DC

## 2014-04-01 NOTE — Telephone Encounter (Signed)
Rx refill sent to patient pharmacy   

## 2014-04-02 ENCOUNTER — Ambulatory Visit: Payer: Medicare Other | Admitting: Internal Medicine

## 2014-08-06 ENCOUNTER — Other Ambulatory Visit (HOSPITAL_COMMUNITY): Payer: Self-pay | Admitting: Family Medicine

## 2014-08-06 DIAGNOSIS — M545 Low back pain: Secondary | ICD-10-CM

## 2014-08-11 ENCOUNTER — Ambulatory Visit (HOSPITAL_COMMUNITY)
Admission: RE | Admit: 2014-08-11 | Discharge: 2014-08-11 | Disposition: A | Discharge status: Home / Self Care | Payer: Medicare Other | Source: Ambulatory Visit | Attending: Family Medicine | Admitting: Family Medicine

## 2014-08-11 DIAGNOSIS — M79609 Pain in unspecified limb: Secondary | ICD-10-CM | POA: Diagnosis not present

## 2014-08-11 DIAGNOSIS — M5126 Other intervertebral disc displacement, lumbar region: Secondary | ICD-10-CM | POA: Diagnosis not present

## 2014-08-11 DIAGNOSIS — M545 Low back pain, unspecified: Secondary | ICD-10-CM | POA: Insufficient documentation

## 2014-08-11 DIAGNOSIS — M48061 Spinal stenosis, lumbar region without neurogenic claudication: Secondary | ICD-10-CM | POA: Diagnosis not present

## 2014-09-18 ENCOUNTER — Other Ambulatory Visit: Payer: Self-pay | Admitting: Neurosurgery

## 2014-10-05 ENCOUNTER — Encounter (HOSPITAL_COMMUNITY)
Admission: RE | Admit: 2014-10-05 | Discharge: 2014-10-05 | Disposition: A | Discharge status: Home / Self Care | Payer: Medicare Other | Source: Ambulatory Visit | Attending: Neurosurgery | Admitting: Neurosurgery

## 2014-10-05 ENCOUNTER — Encounter (HOSPITAL_COMMUNITY)
Admission: RE | Admit: 2014-10-05 | Discharge: 2014-10-05 | Disposition: A | Discharge status: Home / Self Care | Payer: Medicare Other | Source: Ambulatory Visit | Attending: Anesthesiology | Admitting: Anesthesiology

## 2014-10-05 ENCOUNTER — Encounter (HOSPITAL_COMMUNITY): Payer: Self-pay

## 2014-10-05 ENCOUNTER — Encounter (HOSPITAL_COMMUNITY): Payer: Self-pay | Admitting: Vascular Surgery

## 2014-10-05 DIAGNOSIS — Z01818 Encounter for other preprocedural examination: Secondary | ICD-10-CM

## 2014-10-05 DIAGNOSIS — I2510 Atherosclerotic heart disease of native coronary artery without angina pectoris: Secondary | ICD-10-CM | POA: Insufficient documentation

## 2014-10-05 DIAGNOSIS — Z0000 Encounter for general adult medical examination without abnormal findings: Secondary | ICD-10-CM | POA: Diagnosis present

## 2014-10-05 DIAGNOSIS — M4712 Other spondylosis with myelopathy, cervical region: Secondary | ICD-10-CM | POA: Diagnosis not present

## 2014-10-05 DIAGNOSIS — I252 Old myocardial infarction: Secondary | ICD-10-CM | POA: Insufficient documentation

## 2014-10-05 DIAGNOSIS — E785 Hyperlipidemia, unspecified: Secondary | ICD-10-CM | POA: Diagnosis not present

## 2014-10-05 DIAGNOSIS — I10 Essential (primary) hypertension: Secondary | ICD-10-CM | POA: Diagnosis not present

## 2014-10-05 DIAGNOSIS — I340 Nonrheumatic mitral (valve) insufficiency: Secondary | ICD-10-CM | POA: Insufficient documentation

## 2014-10-05 DIAGNOSIS — Z7982 Long term (current) use of aspirin: Secondary | ICD-10-CM | POA: Diagnosis not present

## 2014-10-05 DIAGNOSIS — Z951 Presence of aortocoronary bypass graft: Secondary | ICD-10-CM | POA: Diagnosis not present

## 2014-10-05 DIAGNOSIS — F17210 Nicotine dependence, cigarettes, uncomplicated: Secondary | ICD-10-CM | POA: Diagnosis not present

## 2014-10-05 HISTORY — DX: Personal history of other diseases of the digestive system: Z87.19

## 2014-10-05 LAB — BASIC METABOLIC PANEL
ANION GAP: 12 (ref 5–15)
BUN: 12 mg/dL (ref 6–23)
CO2: 26 mEq/L (ref 19–32)
Calcium: 9.2 mg/dL (ref 8.4–10.5)
Chloride: 100 mEq/L (ref 96–112)
Creatinine, Ser: 0.91 mg/dL (ref 0.50–1.35)
GFR calc Af Amer: >90 mL/min (ref >90)
GFR calc non Af Amer: 88 mL/min — ABNORMAL LOW (ref >90)
Glucose, Bld: 101 mg/dL — ABNORMAL HIGH (ref 70–99)
Potassium: 4.4 mEq/L (ref 3.7–5.3)
Sodium: 138 mEq/L (ref 137–147)

## 2014-10-05 LAB — CBC
HCT: 39.6 % (ref 39.0–52.0)
Hemoglobin: 13.5 g/dL (ref 13.0–17.0)
MCH: 29.1 pg (ref 26.0–34.0)
MCHC: 34.1 g/dL (ref 30.0–36.0)
MCV: 85.3 fL (ref 78.0–100.0)
PLATELETS: 202 10*3/uL (ref 150–400)
RBC: 4.64 MIL/uL (ref 4.22–5.81)
RDW: 13.9 % (ref 11.5–15.5)
WBC: 7.4 10*3/uL (ref 4.0–10.5)

## 2014-10-05 LAB — TYPE AND SCREEN
ABO/RH(D): O POS
Antibody Screen: NEGATIVE

## 2014-10-05 LAB — SURGICAL PCR SCREEN
MRSA, PCR: NEGATIVE
STAPHYLOCOCCUS AUREUS: NEGATIVE

## 2014-10-05 NOTE — Pre-Procedure Instructions (Addendum)
Jonathan [name]  10/05/2014   Your procedure is scheduled on:  10/07/14  Report to [facility] short stay admitting at 37 AM.  Call this number if you have problems the morning of surgery: [phone]   Remember:   Do not eat food or drink liquids after midnight.   Take these medicines the morning of surgery with A SIP OF WATER: nexium, metoprolol, flomax    Take all meds as ordered until day of surgery except as instructed below or per dr   Bridgette Habermann all herbel meds, nsaids (aleve,naproxen,advil,ibuprofen) today including vitamins      Aspirin per dr   Lazaro Arms not wear jewelry, make-up or nail polish.  Do not wear lotions, powders, or perfumes. You may wear deodorant.  Do not shave 48 hours prior to surgery. Men may shave face and neck.  Do not bring valuables to the hospital.  [facility] is not responsible                  for any belongings or valuables.               Contacts, dentures or bridgework may not be worn into surgery.  Leave suitcase in the car. After surgery it may be brought to your room.  For patients admitted to the hospital, discharge time is determined by your                treatment team.               Patients discharged the day of surgery will not be allowed to drive  home.  Name and phone number of your driver:   Special Instructions:  Special Instructions: [place] - Preparing for Surgery  Before surgery, you can play an important role.  Because skin is not sterile, your skin needs to be as free of germs as possible.  You can reduce the number of germs on you skin by washing with CHG (chlorahexidine gluconate) soap before surgery.  CHG is an antiseptic cleaner which kills germs and bonds with the skin to continue killing germs even after washing.  Please DO NOT use if you have an allergy to CHG or antibacterial soaps.  If your skin becomes reddened/irritated stop using the CHG and inform your nurse when you arrive at Short Stay.  Do not shave (including legs and  underarms) for at least 48 hours prior to the first CHG shower.  You may shave your face.  Please follow these instructions carefully:   1.  Shower with CHG Soap the night before surgery and the morning of Surgery.  2.  If you choose to wash your hair, wash your hair first as usual with your normal shampoo.  3.  After you shampoo, rinse your hair and body thoroughly to remove the Shampoo.  4.  Use CHG as you would any other liquid soap.  You can apply chg directly  to the skin and wash gently with scrungie or a clean washcloth.  5.  Apply the CHG Soap to your body ONLY FROM THE NECK DOWN.  Do not use on open wounds or open sores.  Avoid contact with your eyes ears, mouth and genitals (private parts).  Wash genitals (private parts)       with your normal soap.  6.  Wash thoroughly, paying special attention to the area where your surgery will be performed.  7.  Thoroughly rinse your body with warm water from the neck down.  8.  DO NOT shower/wash with your normal soap after using and rinsing off the CHG Soap.  9.  Pat yourself dry with a clean towel.            10.  Wear clean pajamas.            11.  Place clean sheets on your bed the night of your first shower and do not sleep with pets.  Day of Surgery  Do not apply any lotions/deodorants the morning of surgery.  Please wear clean clothes to the hospital/surgery center.   Please read over the following fact sheets that you were given: Pain Booklet, Coughing and Deep Breathing, MRSA Information and Surgical Site Infection Prevention

## 2014-10-05 NOTE — Progress Notes (Signed)
Anesthesia Chart Review:  Pt is 63 year old male scheduled for C6-7, C7-T1, anterior cervical decompression/diskectomy/fusion on 10/07/14 with Dr. Christella Noa.   PMH: HTN, CAD, MI (2002), peripheral artery disease, s/p CABG (2009), hyperlipidemia. Current smoker - 30 pack years.   Medications include: ASA, metoprolol, ramipril, crestor, flomax  Preoperative labs reviewed.    Chest x-ray reviewed. No active cardiopulmonary disease.  EKG: NSR.   2D echo 08/19/2013:  - Left ventricle: The cavity size was normal. Wall thickness was increased in a pattern of mild LVH. There was mild concentric hypertrophy. Systolic function was normal. The estimated ejection fraction was in the range of 55% to 60%. Wall motion was normal; there were no regional wall motion abnormalities. Left ventricular diastolic function parameters were normal. - Mitral valve: Mild regurgitation. - Left atrium: The atrium was mildly dilated. - Right ventricle: Systolic pressure was increased. - Atrial septum: No defect or patent foramen ovale was identified. - Pulmonary arteries: PA peak pressure: 86mm Hg (S).  Nuclear Stress Test 08/19/2013:   Overall Impression: Intermediate risk stress nuclear study with abnormal inferior horizontal ST depression of 20mm at peak stress, electrically diagnostic for ischemia. Nuclear images suggest a mild, fixed inferobasal defect with underlying bowel, suggesting attenuation, but no ischemia. Hypertensive response to exercise. Clinical correlation is recommended. LV Wall Motion: NL LV Function; NL Wall Motion  Discussed with Dr. Conrad Robbins. Pt will need cardiac clearance.  Left message for Manuela Schwartz in Dr. Lacy Duverney office.   Willeen Cass, FNP-BC [facility]/Anesthesiology Phone: [phone] 10/05/2014 3:33 PM

## 2014-10-05 NOTE — Progress Notes (Signed)
10/05/14 1018  OBSTRUCTIVE SLEEP APNEA  Have you ever been diagnosed with sleep apnea through a sleep study? No  Do you snore loudly (loud enough to be heard through closed doors)?  1  Do you often feel tired, fatigued, or sleepy during the daytime? 0  Has anyone observed you stop breathing during your sleep? 1  Do you have, or are you being treated for high blood pressure? 1  BMI more than 35 kg/m2? 0  Age over 63 years old? 1  Neck circumference greater than 40 cm/16 inches? 0 (16)  Gender: 1  Obstructive Sleep Apnea Score 5  Score 4 or greater  Results sent to PCP

## 2014-10-06 ENCOUNTER — Other Ambulatory Visit: Payer: Self-pay | Admitting: Cardiovascular Disease

## 2014-10-07 ENCOUNTER — Encounter (HOSPITAL_COMMUNITY): Admission: RE | Payer: Self-pay | Source: Ambulatory Visit

## 2014-10-07 ENCOUNTER — Inpatient Hospital Stay (HOSPITAL_COMMUNITY): Admission: RE | Admit: 2014-10-07 | Payer: Medicare Other | Source: Ambulatory Visit | Admitting: Neurosurgery

## 2014-10-07 SURGERY — ANTERIOR CERVICAL DECOMPRESSION/DISCECTOMY FUSION 2 LEVELS
Anesthesia: General

## 2014-10-08 ENCOUNTER — Ambulatory Visit (INDEPENDENT_AMBULATORY_CARE_PROVIDER_SITE_OTHER): Payer: Medicare Other | Admitting: Internal Medicine

## 2014-10-08 ENCOUNTER — Encounter: Payer: Self-pay | Admitting: Internal Medicine

## 2014-10-08 VITALS — BP 134/70 | HR 67 | Ht 68.0 in | Wt 177.0 lb

## 2014-10-08 DIAGNOSIS — R200 Anesthesia of skin: Secondary | ICD-10-CM

## 2014-10-08 DIAGNOSIS — Z01810 Encounter for preprocedural cardiovascular examination: Secondary | ICD-10-CM

## 2014-10-08 DIAGNOSIS — I25119 Atherosclerotic heart disease of native coronary artery with unspecified angina pectoris: Secondary | ICD-10-CM | POA: Insufficient documentation

## 2014-10-08 DIAGNOSIS — N400 Enlarged prostate without lower urinary tract symptoms: Secondary | ICD-10-CM

## 2014-10-08 DIAGNOSIS — Z951 Presence of aortocoronary bypass graft: Secondary | ICD-10-CM | POA: Insufficient documentation

## 2014-10-08 DIAGNOSIS — I2583 Coronary atherosclerosis due to lipid rich plaque: Secondary | ICD-10-CM

## 2014-10-08 DIAGNOSIS — I739 Peripheral vascular disease, unspecified: Secondary | ICD-10-CM

## 2014-10-08 DIAGNOSIS — I2510 Atherosclerotic heart disease of native coronary artery without angina pectoris: Secondary | ICD-10-CM

## 2014-10-08 DIAGNOSIS — I10 Essential (primary) hypertension: Secondary | ICD-10-CM

## 2014-10-08 DIAGNOSIS — K219 Gastro-esophageal reflux disease without esophagitis: Secondary | ICD-10-CM | POA: Insufficient documentation

## 2014-10-08 NOTE — Progress Notes (Signed)
OFFICE NOTE  Chief Complaint:  Pre-operative cardiac exam  Primary Care Physician: Leonides Grills, MD  HPI:  Jonathan [name] is a pleasant 63 year old male patient who was formerly followed by Dr. Rollene Fare. He has an extensive past medical history of coronary disease. He has had numerous prior stents to the right coronary artery and LAD and underwent brachii therapy to the LAD. Eventually developed occlusion of the right coronary artery and ultimately underwent a right free radial graft to the PDA by Dr. Cyndia Bent in 2009. He also has a history of peripheral arterial disease and underwent right common iliac and femoral artery stenting in 2003, but does have bilateral disease which has not been studied since 2009. In addition he has hypertension, GERD and is here today for preoperative cardiac evaluation. He's been having numbness and tingling in his hands as well as legs and is felt to have a cord problem in the neck. His neurosurgeon is Dr. Cyndy Freeze and was planning on operating yesterday however he was noted to have an abnormal EKG and was sent for preoperative evaluation. It should be noted he had a negative nuclear stress test for ischemia in 07/2013. Currently denies any chest pain. He is able to exert himself even walking up stairs without shortness of breath but is having problems with numbness and tingling in his legs as well as cool extremities.  PMHx:  Past Medical History  Diagnosis Date  . Hypertension   . Gastroesophageal reflux   . Prostate hyperplasia with urinary obstruction   . Myocardial infarction 11/2001  . CAD (coronary artery disease)   . PAD (peripheral artery disease)     bilateral iliac artery stents   . Tobacco consumption     quit cigarettes in 2008, smokes 5-6 cigars/week  . S/P CABG x 1 2009    R radial to PDA  . Hyperlipidemia   . H/O hiatal hernia     Past Surgical History  Procedure Laterality Date  . Coronary artery bypass graft  2009    free right  radial to PDA for progressive RCA disease & multiple re-stenosis (Dr. Ellison Hughs)  . Back surgery    . Transthoracic echocardiogram  07/2013    EF 55-60%, mild LVH; mild MR; LA mildly dilated; RVSP increased; PA peak pressure 30mmHg (mod pulm HTN)  . Nm myocar perf wall motion  07/2013    bruce myoview - intermediate risk test with abnormal inferior horizontal ST depression; mild, fixed inferobasal defect, but no ischemia  . Cardiac catheterization  12/03/2001    2.5x63mm S-660 stent to RCA (MI)  . Cardiac catheterization  05/06/2002    PTCA & stenting of LAD and PTCA & stenting of mid/distal RCA with 3.0x4mm Zeta stent  . Cardiac catheterization  06/03/2002    LAD with prox 30% in-stent restenosis; ramus with 95% ostial lesion; L Cfx & 3-OMs free of disease; mild 30% RCA narrowing & 20-30% in-stent restenosis in RCA (Dr. Gerrie Nordmann)  . Cardiac catheterization  03/24/2003    overlapping tandem DES 3.0x23 Cypher stent to mid RCA for in-stent restenosis & new lesion beyond previous stent with 3.0x12 SciMed TAXUS (Dr. Marella Chimes)  . Cardiac catheterization  12/08/2003    3.0x24 TAXUS overlapping 3.0x28mm TAXUS DES in RCA and cuttin balloon atherectomy (Dr. Marella Chimes)  . Cardiac catheterization  07/10/2007    normal L main, L Cfx & 3-OMs free of disease; LAD with Zeta stent in prox region (patent); RCA with multiple stents (3.0x12  Taxus in ostium with 3.0x20 Taxus overlapping it, and series of 3.0x24 Tacus and Cypher stent overlapping this, and in distal RC 2x5x64mm Zeta(?) stent; ostium of RCA with 80% narrowing - 3x15 cutting balloon - 80% to patient w/NO obstruction (Dr. Loni Muse. Little)  . Cardiac catheterization  06/08/2008    high-grade osital & prox in-stent RCA restenosis - Dr. Marella Chimes  . Iliac artery stent Bilateral 01/30/2002    9x36mm Cordis SmartStent to LCIA, 9x3.80mm Genesis stent to RCIA (Dr. Adora Fridge)  . Lower extremity arterial doppler  2009    R CIA stent - less than 50% diameter  reduction; R CFA stent - 0-49% diameter reduction; R SFA less than 50% diameter reduction; L CFA, profunda, SFA - equal/less than 50% diameter reduction    FAMHx:  Family History  Problem Relation Age of Onset  . Heart disease Father   . CAD Brother     CABG  . CAD Brother     CABG    SOCHx:   reports that he has been smoking Cigarettes.  He has a 30 pack-year smoking history. He does not have any smokeless tobacco history on file. He reports that he drinks about 7.2 ounces of alcohol per week. He reports that he does not use illicit drugs.  ALLERGIES:  No Known Allergies  ROS: A comprehensive review of systems was negative except for: Musculoskeletal: positive for muscle weakness, myalgias and cool extremities Neurological: positive for paresthesia  HOME MEDS: Current Outpatient Prescriptions  Medication Sig Dispense Refill  . aspirin EC 81 MG tablet Take 81 mg by mouth daily.      Marland Kitchen esomeprazole (NEXIUM) 40 MG capsule Take 1 capsule (40 mg total) by mouth daily before breakfast.  90 capsule  3  . metoprolol (LOPRESSOR) 50 MG tablet Take 50 mg by mouth daily.      . Omega-3 Fatty Acids (FISH OIL) 1000 MG CAPS Take by mouth 1 day or 1 dose.      . ramipril (ALTACE) 10 MG capsule take 1 capsule by mouth once daily  90 capsule  3  . rosuvastatin (CRESTOR) 10 MG tablet Take 10 mg by mouth at bedtime.       . tamsulosin (FLOMAX) 0.4 MG CAPS capsule Take 0.4 mg by mouth daily.       No current facility-administered medications for this visit.    LABS/IMAGING: No results found for this or any previous visit (from the past 48 hour(s)). No results found.  VITALS: BP 134/70  Pulse 67  Ht 5\' 8"  (1.727 m)  Wt 177 lb (80.287 kg)  BMI 26.92 kg/m2  EXAM: General appearance: alert and no distress Neck: no carotid bruit and no JVD Lungs: clear to auscultation bilaterally Heart: regular rate and rhythm, S1, S2 normal, no murmur, click, rub or gallop Abdomen: soft, non-tender;  bowel sounds normal; no masses,  no organomegaly Extremities: cool to touch, 1+ pulses, no edema Pulses: 1+ Skin: pale, cool, dry Neurologic: Mental status: Alert, oriented, thought content appropriate Psych: Normal  EKG: Normal sinus rhythm at 67 with small inferior Q waves  ASSESSMENT: 1. Coronary artery disease status post single vessel CABG in 2009 (3 right radial to PDA) 2. Numerous prior coronary interventions to the RCA and LAD with brachii therapy 3. PAD status post right common iliac and right superficial femoral artery stents in 2003 4. Hypertension 5. Low to intermediate risk for upcoming surgery  PLAN: 1.   Jonathan [name] is at low to intermediate  risk for his upcoming neck surgery. It is possible that his upper and lower chimney symptoms are due to a cord compression, however he does have coolness of the legs and prior known peripheral arterial disease. His last peripheral Dopplers were in 2009. I would like to repeat Dopplers to evaluate for in-stent restenosis. From a cardiac standpoint, his stress test last year was negative for ischemia and is still valid. He does not ascribe to any anginal symptoms or worsening shortness of breath. I feel that he is at low to intermediate risk for upcoming surgery. We'll plan to see him back in 6 months and contact him with the results of his peripheral Dopplers once they're performed.  Thank you for the referral.  Pixie Casino, MD, [facility] Attending Cardiologist [facility]  Wenda Vanschaick C 10/08/2014, 4:23 PM

## 2014-10-08 NOTE — Patient Instructions (Signed)
Dr. Debara Pickett has cleared you for surgery   Your physician has requested that you have a lower extremity arterial duplex. This test is an ultrasound of the arteries in the legs. It looks at arterial blood flow in the legs. Allow one hour for Lower Arterial scans. There are no restrictions or special instructions  Your physician wants you to follow-up in: 6 months with Dr. Debara Pickett. You will receive a reminder letter in the mail two months in advance. If you don't receive a letter, please call our office to schedule the follow-up appointment.

## 2014-10-09 ENCOUNTER — Other Ambulatory Visit (HOSPITAL_COMMUNITY): Payer: Self-pay | Admitting: Neurosurgery

## 2014-10-15 ENCOUNTER — Telehealth (HOSPITAL_COMMUNITY): Payer: Self-pay | Admitting: *Deleted

## 2014-10-21 ENCOUNTER — Encounter (HOSPITAL_COMMUNITY): Payer: Self-pay | Admitting: *Deleted

## 2014-10-21 MED ORDER — CEFAZOLIN SODIUM-DEXTROSE 2-3 GM-% IV SOLR
2.0000 g | INTRAVENOUS | Status: AC
  Administered 2014-10-22: 2 g via INTRAVENOUS
  Filled 2014-10-21: qty 50

## 2014-10-21 NOTE — Progress Notes (Signed)
Cardiac clearance noted in EPIC by Dr. Debara Pickett on 10/08/14.

## 2014-10-22 ENCOUNTER — Inpatient Hospital Stay (HOSPITAL_COMMUNITY)
Admission: RE | Admit: 2014-10-22 | Discharge: 2014-10-23 | DRG: 473 | Disposition: A | Discharge status: Home / Self Care | Payer: Medicare Other | Source: Ambulatory Visit | Attending: Neurosurgery | Admitting: Neurosurgery

## 2014-10-22 ENCOUNTER — Encounter (HOSPITAL_COMMUNITY): Payer: Medicare Other | Admitting: Anesthesiology

## 2014-10-22 ENCOUNTER — Encounter (HOSPITAL_COMMUNITY): Payer: Self-pay | Admitting: *Deleted

## 2014-10-22 ENCOUNTER — Inpatient Hospital Stay (HOSPITAL_COMMUNITY): Payer: Medicare Other | Admitting: Anesthesiology

## 2014-10-22 ENCOUNTER — Inpatient Hospital Stay (HOSPITAL_COMMUNITY): Payer: Medicare Other

## 2014-10-22 ENCOUNTER — Encounter (HOSPITAL_COMMUNITY)
Admission: RE | Disposition: A | Discharge status: Home / Self Care | Payer: Self-pay | Source: Ambulatory Visit | Attending: Neurosurgery

## 2014-10-22 DIAGNOSIS — M4302 Spondylolysis, cervical region: Secondary | ICD-10-CM

## 2014-10-22 DIAGNOSIS — M5002 Cervical disc disorder with myelopathy, mid-cervical region: Principal | ICD-10-CM | POA: Diagnosis present

## 2014-10-22 DIAGNOSIS — M5000 Cervical disc disorder with myelopathy, unspecified cervical region: Secondary | ICD-10-CM | POA: Diagnosis present

## 2014-10-22 HISTORY — PX: ANTERIOR CERVICAL DECOMP/DISCECTOMY FUSION: SHX1161

## 2014-10-22 LAB — BASIC METABOLIC PANEL
Anion gap: 12 (ref 5–15)
BUN: 13 mg/dL (ref 6–23)
CHLORIDE: 102 meq/L (ref 96–112)
CO2: 24 meq/L (ref 19–32)
Calcium: 9.3 mg/dL (ref 8.4–10.5)
Creatinine, Ser: 0.81 mg/dL (ref 0.50–1.35)
GFR calc Af Amer: >90 mL/min (ref >90)
GLUCOSE: 96 mg/dL (ref 70–99)
POTASSIUM: 4.2 meq/L (ref 3.7–5.3)
Sodium: 138 mEq/L (ref 137–147)

## 2014-10-22 LAB — CBC
HCT: 38.7 % — ABNORMAL LOW (ref 39.0–52.0)
Hemoglobin: 13.2 g/dL (ref 13.0–17.0)
MCH: 29.3 pg (ref 26.0–34.0)
MCHC: 34.1 g/dL (ref 30.0–36.0)
MCV: 85.8 fL (ref 78.0–100.0)
PLATELETS: 194 10*3/uL (ref 150–400)
RBC: 4.51 MIL/uL (ref 4.22–5.81)
RDW: 13.8 % (ref 11.5–15.5)
WBC: 7.4 10*3/uL (ref 4.0–10.5)

## 2014-10-22 LAB — TYPE AND SCREEN
ABO/RH(D): O POS
Antibody Screen: NEGATIVE

## 2014-10-22 SURGERY — ANTERIOR CERVICAL DECOMPRESSION/DISCECTOMY FUSION 2 LEVELS
Anesthesia: General

## 2014-10-22 MED ORDER — ACETAMINOPHEN 650 MG RE SUPP: 650.0000 mg | RECTAL | Status: DC | PRN

## 2014-10-22 MED ORDER — ONDANSETRON HCL 4 MG/2ML IJ SOLN
INTRAMUSCULAR | Status: DC | PRN
  Administered 2014-10-22: 4 mg via INTRAVENOUS

## 2014-10-22 MED ORDER — FENTANYL CITRATE 0.05 MG/ML IJ SOLN
INTRAMUSCULAR | Status: AC
  Filled 2014-10-22: qty 2

## 2014-10-22 MED ORDER — MIDAZOLAM HCL 2 MG/2ML IJ SOLN
INTRAMUSCULAR | Status: AC
  Filled 2014-10-22: qty 2

## 2014-10-22 MED ORDER — SODIUM CHLORIDE 0.9 % IV SOLN: 250.0000 mL | INTRAVENOUS | Status: DC

## 2014-10-22 MED ORDER — MORPHINE SULFATE 2 MG/ML IJ SOLN: 1.0000 mg | INTRAMUSCULAR | Status: DC | PRN

## 2014-10-22 MED ORDER — DIAZEPAM 5 MG PO TABS
ORAL_TABLET | ORAL | Status: AC
  Filled 2014-10-22: qty 1

## 2014-10-22 MED ORDER — PHENOL 1.4 % MT LIQD: 1.0000 | OROMUCOSAL | Status: DC | PRN

## 2014-10-22 MED ORDER — OMEGA-3-ACID ETHYL ESTERS 1 G PO CAPS
1.0000 g | ORAL_CAPSULE | Freq: Every day | ORAL | Status: DC
  Administered 2014-10-22: 1 g via ORAL
  Filled 2014-10-22 (×2): qty 1

## 2014-10-22 MED ORDER — GLYCOPYRROLATE 0.2 MG/ML IJ SOLN
INTRAMUSCULAR | Status: DC | PRN
  Administered 2014-10-22: 0.6 mg via INTRAVENOUS

## 2014-10-22 MED ORDER — RAMIPRIL 10 MG PO CAPS
10.0000 mg | ORAL_CAPSULE | Freq: Every day | ORAL | Status: DC
  Administered 2014-10-22: 10 mg via ORAL
  Filled 2014-10-22 (×2): qty 1

## 2014-10-22 MED ORDER — MENTHOL 3 MG MT LOZG
1.0000 | LOZENGE | OROMUCOSAL | Status: DC | PRN
  Filled 2014-10-22: qty 9

## 2014-10-22 MED ORDER — POTASSIUM CHLORIDE IN NACL 20-0.9 MEQ/L-% IV SOLN
INTRAVENOUS | Status: DC
  Administered 2014-10-22: 19:00:00 via INTRAVENOUS
  Filled 2014-10-22 (×3): qty 1000

## 2014-10-22 MED ORDER — OXYCODONE HCL 5 MG PO TABS
5.0000 mg | ORAL_TABLET | Freq: Once | ORAL | Status: AC | PRN
  Administered 2014-10-22: 5 mg via ORAL

## 2014-10-22 MED ORDER — LACTATED RINGERS IV SOLN
INTRAVENOUS | Status: DC | PRN
  Administered 2014-10-22 (×2): via INTRAVENOUS

## 2014-10-22 MED ORDER — OXYCODONE HCL 5 MG/5ML PO SOLN
5.0000 mg | Freq: Once | ORAL | Status: AC | PRN
Start: 2014-10-22 — End: 2014-10-22

## 2014-10-22 MED ORDER — DEXTROSE 5 % IV SOLN
10.0000 mg | INTRAVENOUS | Status: DC | PRN
  Administered 2014-10-22: 10 ug/min via INTRAVENOUS

## 2014-10-22 MED ORDER — EPHEDRINE SULFATE 50 MG/ML IJ SOLN
INTRAMUSCULAR | Status: DC | PRN
  Administered 2014-10-22: 5 mg via INTRAVENOUS

## 2014-10-22 MED ORDER — METOPROLOL TARTRATE 50 MG PO TABS
50.0000 mg | ORAL_TABLET | Freq: Every day | ORAL | Status: DC
  Filled 2014-10-22: qty 1

## 2014-10-22 MED ORDER — LIDOCAINE-EPINEPHRINE 0.5 %-1:200000 IJ SOLN
INTRAMUSCULAR | Status: DC | PRN
Start: 2014-10-22 — End: 2014-10-22
  Administered 2014-10-22: 2.5 mL

## 2014-10-22 MED ORDER — FENTANYL CITRATE 0.05 MG/ML IJ SOLN
INTRAMUSCULAR | Status: DC | PRN
  Administered 2014-10-22 (×5): 50 ug via INTRAVENOUS

## 2014-10-22 MED ORDER — NEOSTIGMINE METHYLSULFATE 10 MG/10ML IV SOLN
INTRAVENOUS | Status: DC | PRN
  Administered 2014-10-22: 4 mg via INTRAVENOUS

## 2014-10-22 MED ORDER — ACETAMINOPHEN 325 MG PO TABS: 650.0000 mg | ORAL_TABLET | ORAL | Status: DC | PRN

## 2014-10-22 MED ORDER — DIAZEPAM 5 MG PO TABS
5.0000 mg | ORAL_TABLET | Freq: Four times a day (QID) | ORAL | Status: DC | PRN
  Administered 2014-10-22: 5 mg via ORAL

## 2014-10-22 MED ORDER — HEMOSTATIC AGENTS (NO CHARGE) OPTIME
TOPICAL | Status: DC | PRN
  Administered 2014-10-22: 1 via TOPICAL

## 2014-10-22 MED ORDER — SODIUM CHLORIDE 0.9 % IJ SOLN: 3.0000 mL | INTRAMUSCULAR | Status: DC | PRN

## 2014-10-22 MED ORDER — MIDAZOLAM HCL 5 MG/5ML IJ SOLN
INTRAMUSCULAR | Status: DC | PRN
  Administered 2014-10-22: 2 mg via INTRAVENOUS

## 2014-10-22 MED ORDER — THROMBIN 5000 UNITS EX SOLR
CUTANEOUS | Status: DC | PRN
  Administered 2014-10-22 (×2): 5000 [IU] via TOPICAL

## 2014-10-22 MED ORDER — ONDANSETRON HCL 4 MG/2ML IJ SOLN: 4.0000 mg | INTRAMUSCULAR | Status: DC | PRN

## 2014-10-22 MED ORDER — LACTATED RINGERS IV SOLN
INTRAVENOUS | Status: DC
  Administered 2014-10-22: 11:00:00 via INTRAVENOUS

## 2014-10-22 MED ORDER — DEXAMETHASONE SODIUM PHOSPHATE 10 MG/ML IJ SOLN
INTRAMUSCULAR | Status: DC | PRN
  Administered 2014-10-22: 10 mg via INTRAVENOUS

## 2014-10-22 MED ORDER — SENNA 8.6 MG PO TABS
1.0000 | ORAL_TABLET | Freq: Two times a day (BID) | ORAL | Status: DC
  Administered 2014-10-22: 8.6 mg via ORAL
  Filled 2014-10-22 (×2): qty 1

## 2014-10-22 MED ORDER — ARTIFICIAL TEARS OP OINT
TOPICAL_OINTMENT | OPHTHALMIC | Status: DC | PRN
  Administered 2014-10-22: 1 via OPHTHALMIC

## 2014-10-22 MED ORDER — PROPOFOL 10 MG/ML IV BOLUS
INTRAVENOUS | Status: AC
  Filled 2014-10-22: qty 20

## 2014-10-22 MED ORDER — OXYCODONE HCL 5 MG PO TABS
ORAL_TABLET | ORAL | Status: AC
  Filled 2014-10-22: qty 1

## 2014-10-22 MED ORDER — ROSUVASTATIN CALCIUM 10 MG PO TABS
10.0000 mg | ORAL_TABLET | Freq: Every day | ORAL | Status: DC
  Administered 2014-10-22: 10 mg via ORAL
  Filled 2014-10-22 (×2): qty 1

## 2014-10-22 MED ORDER — ROCURONIUM BROMIDE 100 MG/10ML IV SOLN
INTRAVENOUS | Status: DC | PRN
  Administered 2014-10-22: 50 mg via INTRAVENOUS

## 2014-10-22 MED ORDER — SODIUM CHLORIDE 0.9 % IJ SOLN
3.0000 mL | Freq: Two times a day (BID) | INTRAMUSCULAR | Status: DC
  Administered 2014-10-22: 3 mL via INTRAVENOUS

## 2014-10-22 MED ORDER — LIDOCAINE HCL 4 % MT SOLN
OROMUCOSAL | Status: DC | PRN
  Administered 2014-10-22: 4 mL via TOPICAL

## 2014-10-22 MED ORDER — FENTANYL CITRATE 0.05 MG/ML IJ SOLN
INTRAMUSCULAR | Status: AC
  Filled 2014-10-22: qty 5

## 2014-10-22 MED ORDER — POLYETHYLENE GLYCOL 3350 17 G PO PACK
17.0000 g | PACK | Freq: Every day | ORAL | Status: DC | PRN
  Filled 2014-10-22: qty 1

## 2014-10-22 MED ORDER — ASPIRIN EC 81 MG PO TBEC
81.0000 mg | DELAYED_RELEASE_TABLET | Freq: Every day | ORAL | Status: DC
  Administered 2014-10-22: 81 mg via ORAL
  Filled 2014-10-22 (×2): qty 1

## 2014-10-22 MED ORDER — FENTANYL CITRATE 0.05 MG/ML IJ SOLN
25.0000 ug | INTRAMUSCULAR | Status: DC | PRN
  Administered 2014-10-22: 25 ug via INTRAVENOUS
  Administered 2014-10-22: 50 ug via INTRAVENOUS

## 2014-10-22 MED ORDER — PANTOPRAZOLE SODIUM 40 MG PO TBEC: 80.0000 mg | DELAYED_RELEASE_TABLET | Freq: Every day | ORAL | Status: DC

## 2014-10-22 MED ORDER — TAMSULOSIN HCL 0.4 MG PO CAPS
0.4000 mg | ORAL_CAPSULE | Freq: Every day | ORAL | Status: DC
  Filled 2014-10-22: qty 1

## 2014-10-22 MED ORDER — HYDROCODONE-ACETAMINOPHEN 5-325 MG PO TABS
1.0000 | ORAL_TABLET | ORAL | Status: DC | PRN
  Administered 2014-10-23: 2 via ORAL
  Administered 2014-10-23: 1 via ORAL
  Filled 2014-10-22: qty 1
  Filled 2014-10-22: qty 2

## 2014-10-22 MED ORDER — 0.9 % SODIUM CHLORIDE (POUR BTL) OPTIME
TOPICAL | Status: DC | PRN
  Administered 2014-10-22: 1000 mL

## 2014-10-22 MED ORDER — PROPOFOL 10 MG/ML IV BOLUS
INTRAVENOUS | Status: DC | PRN
  Administered 2014-10-22: 200 mg via INTRAVENOUS

## 2014-10-22 MED ORDER — ONDANSETRON HCL 4 MG/2ML IJ SOLN: 4.0000 mg | Freq: Once | INTRAMUSCULAR | Status: DC | PRN

## 2014-10-22 SURGICAL SUPPLY — 71 items
ADH SKN CLS LQ APL DERMABOND (GAUZE/BANDAGES/DRESSINGS) ×1
BIT DRILL NEURO 2X3.1 SFT TUCH (MISCELLANEOUS) ×1 IMPLANT
BNDG GAUZE ELAST 4 BULKY (GAUZE/BANDAGES/DRESSINGS) IMPLANT
BUR DRUM 4.0 (BURR) IMPLANT
BUR DRUM 4.0MM (BURR)
CANISTER SUCT 3000ML (MISCELLANEOUS) ×3 IMPLANT
CONT SPEC 4OZ CLIKSEAL STRL BL (MISCELLANEOUS) ×3 IMPLANT
DECANTER SPIKE VIAL GLASS SM (MISCELLANEOUS) ×3 IMPLANT
DERMABOND ADHESIVE PROPEN (GAUZE/BANDAGES/DRESSINGS) ×2
DERMABOND ADVANCED .7 DNX6 (GAUZE/BANDAGES/DRESSINGS) ×1 IMPLANT
DRAPE LAPAROTOMY 100X72 PEDS (DRAPES) ×3 IMPLANT
DRAPE MICROSCOPE LEICA (MISCELLANEOUS) ×3 IMPLANT
DRAPE POUCH INSTRU U-SHP 10X18 (DRAPES) ×3 IMPLANT
DRAPE PROXIMA HALF (DRAPES) IMPLANT
DRILL NEURO 2X3.1 SOFT TOUCH (MISCELLANEOUS) ×3
DURAPREP 6ML APPLICATOR 50/CS (WOUND CARE) ×3 IMPLANT
ELECT COATED BLADE 2.86 ST (ELECTRODE) ×3 IMPLANT
ELECT REM PT RETURN 9FT ADLT (ELECTROSURGICAL) ×3
ELECTRODE REM PT RTRN 9FT ADLT (ELECTROSURGICAL) ×1 IMPLANT
GAUZE SPONGE 4X4 16PLY XRAY LF (GAUZE/BANDAGES/DRESSINGS) IMPLANT
GLOVE BIO SURGEON STRL SZ 6.5 (GLOVE) IMPLANT
GLOVE BIO SURGEON STRL SZ7 (GLOVE) IMPLANT
GLOVE BIO SURGEON STRL SZ7.5 (GLOVE) IMPLANT
GLOVE BIO SURGEON STRL SZ8 (GLOVE) IMPLANT
GLOVE BIO SURGEON STRL SZ8.5 (GLOVE) IMPLANT
GLOVE BIO SURGEONS STRL SZ 6.5 (GLOVE)
GLOVE BIOGEL M 8.0 STRL (GLOVE) IMPLANT
GLOVE ECLIPSE 6.5 STRL STRAW (GLOVE) ×3 IMPLANT
GLOVE ECLIPSE 7.0 STRL STRAW (GLOVE) IMPLANT
GLOVE ECLIPSE 7.5 STRL STRAW (GLOVE) IMPLANT
GLOVE ECLIPSE 8.0 STRL XLNG CF (GLOVE) IMPLANT
GLOVE ECLIPSE 8.5 STRL (GLOVE) IMPLANT
GLOVE EXAM NITRILE LRG STRL (GLOVE) IMPLANT
GLOVE EXAM NITRILE MD LF STRL (GLOVE) IMPLANT
GLOVE EXAM NITRILE XL STR (GLOVE) IMPLANT
GLOVE EXAM NITRILE XS STR PU (GLOVE) IMPLANT
GLOVE INDICATOR 6.5 STRL GRN (GLOVE) IMPLANT
GLOVE INDICATOR 7.0 STRL GRN (GLOVE) IMPLANT
GLOVE INDICATOR 7.5 STRL GRN (GLOVE) IMPLANT
GLOVE INDICATOR 8.0 STRL GRN (GLOVE) IMPLANT
GLOVE INDICATOR 8.5 STRL (GLOVE) IMPLANT
GLOVE OPTIFIT SS 8.0 STRL (GLOVE) IMPLANT
GLOVE SURG SS PI 6.5 STRL IVOR (GLOVE) IMPLANT
GOWN STRL REUS W/ TWL LRG LVL3 (GOWN DISPOSABLE) ×3 IMPLANT
GOWN STRL REUS W/ TWL XL LVL3 (GOWN DISPOSABLE) IMPLANT
GOWN STRL REUS W/TWL 2XL LVL3 (GOWN DISPOSABLE) IMPLANT
GOWN STRL REUS W/TWL LRG LVL3 (GOWN DISPOSABLE) ×9
GOWN STRL REUS W/TWL XL LVL3 (GOWN DISPOSABLE)
KIT BASIN OR (CUSTOM PROCEDURE TRAY) ×3 IMPLANT
KIT ROOM TURNOVER OR (KITS) ×3 IMPLANT
LIQUID BAND (GAUZE/BANDAGES/DRESSINGS) ×3 IMPLANT
NEEDLE HYPO 25X1 1.5 SAFETY (NEEDLE) ×3 IMPLANT
NEEDLE SPNL 22GX3.5 QUINCKE BK (NEEDLE) ×3 IMPLANT
NS IRRIG 1000ML POUR BTL (IV SOLUTION) ×3 IMPLANT
PACK LAMINECTOMY NEURO (CUSTOM PROCEDURE TRAY) ×3 IMPLANT
PAD ARMBOARD 7.5X6 YLW CONV (MISCELLANEOUS) ×9 IMPLANT
PIN DISTRACTION 14MM (PIN) ×6 IMPLANT
PLATE HELIX-R 24MM (Plate) ×3 IMPLANT
RUBBERBAND STERILE (MISCELLANEOUS) ×6 IMPLANT
SCREW 4.0X13 (Screw) ×4 IMPLANT
SCREW 4.0X13MM (Screw) ×8 IMPLANT
SPACER CC-ACF 8MM PARALLEL (Bone Implant) ×3 IMPLANT
SPONGE INTESTINAL PEANUT (DISPOSABLE) ×3 IMPLANT
SPONGE SURGIFOAM ABS GEL SZ50 (HEMOSTASIS) ×3 IMPLANT
SUT VIC AB 0 CT1 27 (SUTURE)
SUT VIC AB 0 CT1 27XBRD ANTBC (SUTURE) IMPLANT
SUT VIC AB 3-0 SH 8-18 (SUTURE) ×3 IMPLANT
SYR 20ML ECCENTRIC (SYRINGE) ×3 IMPLANT
TOWEL OR 17X24 6PK STRL BLUE (TOWEL DISPOSABLE) ×3 IMPLANT
TOWEL OR 17X26 10 PK STRL BLUE (TOWEL DISPOSABLE) ×3 IMPLANT
WATER STERILE IRR 1000ML POUR (IV SOLUTION) ×3 IMPLANT

## 2014-10-22 NOTE — Transfer of Care (Signed)
Immediate Anesthesia Transfer of Care Note  Patient: Jonathan [name]  Procedure(s) Performed: Procedure(s) with comments: CERVICAL SIX SEVEN ANTERIOR CERVICAL DECOMPRESSION/DISCECTOMY FUSION   (N/A) - C67 Possible C7T1 anterior cervical decompression with fusion plating and bonegraft  Patient Location: PACU  Anesthesia Type:General  Level of Consciousness: awake and alert   Airway & Oxygen Therapy: Patient Spontanous Breathing and Patient connected to nasal cannula oxygen  Post-op Assessment: Report given to PACU RN, Post -op Vital signs reviewed and stable and Patient moving all extremities  Post vital signs: Reviewed and stable  Complications: No apparent anesthesia complications

## 2014-10-22 NOTE — Anesthesia Postprocedure Evaluation (Signed)
  Anesthesia Post-op Note  Patient: Jonathan [name]  Procedure(s) Performed: Procedure(s) with comments: CERVICAL SIX SEVEN ANTERIOR CERVICAL DECOMPRESSION/DISCECTOMY FUSION   (N/A) - C67 Possible C7T1 anterior cervical decompression with fusion plating and bonegraft  Patient Location: PACU  Anesthesia Type:General  Level of Consciousness: awake, alert  and oriented  Airway and Oxygen Therapy: Patient Spontanous Breathing and Patient connected to nasal cannula oxygen  Post-op Pain: mild  Post-op Assessment: Post-op Vital signs reviewed, Patient's Cardiovascular Status Stable, Respiratory Function Stable, Patent Airway, No signs of Nausea or vomiting and Pain level controlled  Post-op Vital Signs: stable  Last Vitals:  Filed Vitals:   10/22/14 1630  BP: 154/62  Pulse: 80  Temp:   Resp: 14    Complications: No apparent anesthesia complications

## 2014-10-22 NOTE — Anesthesia Preprocedure Evaluation (Signed)
Anesthesia Evaluation  Patient identified by MRN, date of birth, ID band Patient awake    Reviewed: Allergy & Precautions, H&P , NPO status , Patient's Chart, lab work & pertinent test results, reviewed documented beta blocker date and time , Unable to perform ROS - Chart review only  Airway Mallampati: II  TM Distance: >3 FB Neck ROM: Full    Dental  (+) Teeth Intact, Dental Advisory Given   Pulmonary former smoker,  breath sounds clear to auscultation        Cardiovascular hypertension, Rhythm:Regular Rate:Normal     Neuro/Psych    GI/Hepatic   Endo/Other    Renal/GU      Musculoskeletal   Abdominal   Peds  Hematology   Anesthesia Other Findings   Reproductive/Obstetrics                             Anesthesia Physical Anesthesia Plan  ASA: III  Anesthesia Plan: General   Post-op Pain Management:    Induction: Intravenous  Airway Management Planned: Oral ETT  Additional Equipment:   Intra-op Plan:   Post-operative Plan: Extubation in OR  Informed Consent: I have reviewed the patients History and Physical, chart, labs and discussed the procedure including the risks, benefits and alternatives for the proposed anesthesia with the patient or authorized representative who has indicated his/her understanding and acceptance.   Dental advisory given  Plan Discussed with: CRNA and Anesthesiologist  Anesthesia Plan Comments:         Anesthesia Quick Evaluation

## 2014-10-22 NOTE — H&P (Signed)
  BP 138/79  Pulse 64  Temp(Src) 98.1 F (36.7 C) (Oral)  Resp 20  Ht 5\' 8"  (1.727 m)  Wt 80.287 kg (177 lb)  BMI 26.92 kg/m2  SpO2 98%  Mr. Hannen presents today for evaluation of problems he is having in his neck along with pain that he has had for the last three months.  He describes bilateral numbness in his fourth and fifth digits into the forearms.  It is worse on the right side.  He has arm weakness, some grip strength weakness which is worse in the right when compared to the left.  Biermann had just pain in his lower back with an MRI performed on 08/11/2014 and at that time showed some spondylitic change at L3-4 and foraminal narrowing on the right side at L4-5 which is unchanged from previous views and foraminal encroachment at L5-S1 left greater than right which also was unchanged.  Brenneman is describing numbness in the fourth and fifth digits of each hand, right worse than left and he has some decreased sensation peripherally.     REVIEW OF SYSTEMS:                                    Positive only for back pain, muscle weakness, neck pain, arm pain, leg pain, some weakness while walking.  He denies reproductive, allergic, hematologic, skin, psychiatric, neurological, endocrine, genitourinary, constitutional, ears, nose, mouth, throat, eyes, respiratory, cardiovascular problems.     PAST MEDICAL HISTORY:              Medications and Allergies:  He takes aspirin, metoprolol, Nexium, ramipril.  No known drug allergies.     PHYSICAL EXAMINATION:                                Vital signs are as follows, height 68 inches, weight 177.4 pounds, BMI is 26.97, blood pressure 137/73, pulse 64, respiratory rate 15, temperature 96.5 Fahrenheit.  Pain is 0/10.  On examination, he is alert and oriented x4 and answering all questions appropriately.  Memory, language, attention span and fund of knowledge are normal.  Well kempt and in no distress.  5/5 in the lower extremities.  He has some grip  weakness right greater than left.  He has some intrinsic weakness right greater than left.  He has some difficulty with fine motor movement.  Biceps is good.  Deltoids are fine.  Proprioception is intact in the upper and lower extremities.  Normal muscle tone, bulk, and coordination.    Mr. Hellums returns today after an MRI and EMG of the upper extremities were performed.  The MRI of the cervical spine actually nails the problem.  He has fairly severe spinal cord compression at C6-7 and C7-T1.  He has altered cord signal at this level also.  I think this is his possible entity for the weakness that he has in his hands, the problems with dexterity that he has in his hands, the problems with balance that he has in his lower extremities.  He has some numbness in his lower extremities and he has an MRI which shows a good deal of spondylitic changes and root compression, but I do not think this is the primary cause of his balance issues.

## 2014-10-22 NOTE — Op Note (Signed)
10/22/2014  3:34 PM  PATIENT:  Jonathan [name]  63 y.o. male with severe cord compression, altered signal and a herniated disc at C6/7. I recommended and he has agreed to operative decompression.   PRE-OPERATIVE DIAGNOSIS:  cervical spondylosis with cord compression C6/7  POST-OPERATIVE DIAGNOSIS:  cervical spondylosis with cord compression c6/7  PROCEDURE:  Anterior Cervical decompression C6/7 Arthrodesis C6/7 with 29mm structural allograft Anterior instrumentation(Nuvasive Helix) C6/7  SURGEON:  Surgeon(s): Ashok Pall, MD Erline Levine, MD  ASSISTANTS:Stern, Jeral Fruit  ANESTHESIA:   general  EBL:  Total I/O In: 1300 [I.V.:1300] Out: 25 [Blood:25]  BLOOD ADMINISTERED:none  CELL SAVER GIVEN:none  COUNT:per nursing  DRAINS: none   SPECIMEN:  No Specimen  DICTATION: [name] was taken to the operating room, intubated, and placed under general anesthesia without difficulty. He was positioned supine with his head in slight extension on a horseshoe headrest. The neck was prepped and draped in a sterile manner. I infiltrated 2 cc's 1/2%lidocaine/1:200,000 strength epinephrine into the planned incision starting from the midline to the medial border of the left sternocleidomastoid muscle. I opened the incision with a 10 blade and dissected sharply through soft tissue to the platysma. I dissected in the plane superior to the platysma both rostrally and caudally. I then opened the platysma in a horizontal fashion with Metzenbaum scissors, and dissected in the inferior plane rostrally and caudally. With both blunt and sharp technique I created an avascular corridor to the cervical spine. I placed a spinal needle(s) in the disc space at C6/7 . I then reflected the longus colli from C6 to C7 and placed self retaining retractors. I opened the disc space(s) at C6/7 with a 15 blade. I removed disc with curettes, Kerrison punches, and the drill. Using the drill I removed osteophytes and prepared for  the decompression.  I decompressed the spinal canal and the C7 root(s) with the drill, Kerrison punches, and the curettes. I used the microscope to aid in microdissection. I removed the posterior longitudinal ligament to fully expose and decompress the thecal sac. I exposed the roots laterally taking down the 6/7 uncovertebral joints. With the decompression complete we moved on to the arthrodesis. I used the drill to level the surfaces of C6, and C7. I removed soft tissue to prepare the disc space and the bony surfaces. I measured the space and placed an 74mm structural allograft into the disc space.  I then placed the anterior instrumentation. I placed 2 screws in each vertebral body through the plate. I locked the screws into place. Intraoperative xray showed the graft, plate, and screws to be in good position. I irrigated the wound, achieved hemostasis, and closed the wound in layers. I approximated the platysma, and the subcuticular plane with vicryl sutures. I used Dermabond for a sterile dressing.   PLAN OF CARE: Admit for overnight observation  PATIENT DISPOSITION:  PACU - hemodynamically stable.   Delay start of Pharmacological VTE agent (>24hrs) due to surgical blood loss or risk of bleeding:  yes

## 2014-10-23 ENCOUNTER — Encounter (HOSPITAL_COMMUNITY): Payer: Self-pay | Admitting: Neurosurgery

## 2014-10-23 DIAGNOSIS — M5002 Cervical disc disorder with myelopathy, mid-cervical region: Secondary | ICD-10-CM | POA: Diagnosis present

## 2014-10-23 DIAGNOSIS — M542 Cervicalgia: Secondary | ICD-10-CM | POA: Diagnosis present

## 2014-10-23 MED ORDER — ALUM & MAG HYDROXIDE-SIMETH 200-200-20 MG/5ML PO SUSP: 30.0000 mL | Freq: Four times a day (QID) | ORAL | Status: DC | PRN

## 2014-10-23 MED ORDER — CYCLOBENZAPRINE HCL 10 MG PO TABS: 10.0000 mg | ORAL_TABLET | Freq: Three times a day (TID) | ORAL | Status: DC | PRN

## 2014-10-23 MED ORDER — HYDROCODONE-ACETAMINOPHEN 5-325 MG PO TABS: 1.0000 | ORAL_TABLET | Freq: Four times a day (QID) | ORAL | Status: DC | PRN

## 2014-10-23 NOTE — Discharge Instructions (Signed)

## 2014-10-23 NOTE — Progress Notes (Signed)
Pt doing well. Pt and wife given D/C instructions with verbal understanding. Pt's incision is clean and dry with no sign of infection. Pt's IV was removed prior to D/C. Pt D/C'd home via wheelchair @ 1120 per MD order. Pt is stable @ D/C and has no other needs. Holli Humbles, RN

## 2014-10-23 NOTE — Discharge Summary (Signed)
  Physician Discharge Summary  Patient ID: Jonathan [name] MRN: [id number] DOB/AGE: [birth year] 63 y.o.  Admit date: 10/22/2014 Discharge date: 10/23/2014  Admission Diagnoses:hnp cervical C6/7 with myelopathy  Discharge Diagnoses:  Active Problems:   HNP (herniated nucleus pulposus) with myelopathy, cervical   Discharged Condition: good  Hospital Course: [name] was admitted and taken to the operating room for an uncomplicated ACDF with nuvasive hardware at C6/7. Post op he is ambulating, voiding, and tolerating a regular diet without difficulty.   Treatments: surgery: Anterior Cervical decompression C6/7 Arthrodesis C6/7 with 38mm structural allograft Anterior instrumentation(Nuvasive Helix) C6/7   Discharge Exam: Blood pressure 164/70, pulse 79, temperature 98.5 F (36.9 C), temperature source Oral, resp. rate 16, height 5\' 8"  (1.727 m), weight 80.287 kg (177 lb), SpO2 97.00%. General appearance: alert, cooperative, appears stated age and no distress Neurologic: Grossly normal Moving all extremities well Strength is 5/5 wound is clean, dry, without signs of infection.   Disposition: 01-Home or Self Care cervical spondylosis with cord compression    Medication List         aspirin EC 81 MG tablet  Take 81 mg by mouth daily.     cyclobenzaprine 10 MG tablet  Commonly known as:  FLEXERIL  Take 1 tablet (10 mg total) by mouth 3 (three) times daily as needed for muscle spasms.     esomeprazole 40 MG capsule  Commonly known as:  NEXIUM  Take 1 capsule (40 mg total) by mouth daily before breakfast.     Fish Oil 1000 MG Caps  Take 1 capsule by mouth daily.     HYDROcodone-acetaminophen 5-325 MG per tablet  Commonly known as:  NORCO/VICODIN  Take 1 tablet by mouth every 6 (six) hours as needed for moderate pain.     metoprolol 50 MG tablet  Commonly known as:  LOPRESSOR  Take 50 mg by mouth daily.     ramipril 10 MG capsule  Commonly known as:  ALTACE  take 1  capsule by mouth once daily     rosuvastatin 10 MG tablet  Commonly known as:  CRESTOR  Take 10 mg by mouth at bedtime.     tamsulosin 0.4 MG Caps capsule  Commonly known as:  FLOMAX  Take 0.4 mg by mouth daily.           Follow-up Information   Follow up with Chancey Ringel L, MD In 3 weeks. (call office to make an appointment)    Specialty:  Neurosurgery   Contact information:   [place] [address] [phone]       Signed: Pape Parson L 10/23/2014, 11:04 AM

## 2014-10-23 NOTE — Progress Notes (Signed)
Utilization review completed.  

## 2014-11-09 ENCOUNTER — Ambulatory Visit: Payer: Medicare Other | Admitting: Internal Medicine

## 2014-11-16 ENCOUNTER — Telehealth (HOSPITAL_COMMUNITY): Payer: Self-pay | Admitting: *Deleted

## 2014-11-16 ENCOUNTER — Other Ambulatory Visit: Payer: Self-pay

## 2014-11-16 ENCOUNTER — Other Ambulatory Visit (HOSPITAL_COMMUNITY): Payer: Self-pay | Admitting: Internal Medicine

## 2014-11-16 DIAGNOSIS — R200 Anesthesia of skin: Secondary | ICD-10-CM

## 2014-11-16 DIAGNOSIS — I739 Peripheral vascular disease, unspecified: Secondary | ICD-10-CM

## 2014-11-16 MED ORDER — ROSUVASTATIN CALCIUM 10 MG PO TABS: 10.0000 mg | ORAL_TABLET | Freq: Every day | ORAL | Status: DC

## 2014-11-16 NOTE — Telephone Encounter (Signed)
Rx sent to pharmacy   

## 2014-11-26 ENCOUNTER — Encounter (INDEPENDENT_AMBULATORY_CARE_PROVIDER_SITE_OTHER): Payer: Self-pay | Admitting: *Deleted

## 2014-11-26 ENCOUNTER — Ambulatory Visit (HOSPITAL_COMMUNITY)
Admission: RE | Admit: 2014-11-26 | Discharge: 2014-11-26 | Disposition: A | Discharge status: Home / Self Care | Payer: Medicare Other | Source: Ambulatory Visit | Attending: Cardiovascular Disease | Admitting: Cardiovascular Disease

## 2014-11-26 DIAGNOSIS — R200 Anesthesia of skin: Secondary | ICD-10-CM

## 2014-11-26 DIAGNOSIS — I739 Peripheral vascular disease, unspecified: Secondary | ICD-10-CM | POA: Diagnosis not present

## 2014-11-26 NOTE — Progress Notes (Signed)
Arterial Duplex Lower Ext. Completed. Dawnelle Warman, BS, RDMS, RVT  

## 2014-12-30 ENCOUNTER — Other Ambulatory Visit: Payer: Self-pay | Admitting: *Deleted

## 2014-12-30 MED ORDER — METOPROLOL TARTRATE 50 MG PO TABS: 50.0000 mg | ORAL_TABLET | Freq: Every day | ORAL | Status: DC

## 2014-12-30 NOTE — Telephone Encounter (Signed)
RX sent to patients pharmacy

## 2015-01-14 ENCOUNTER — Other Ambulatory Visit: Payer: Self-pay | Admitting: Internal Medicine

## 2015-01-14 MED ORDER — ROSUVASTATIN CALCIUM 10 MG PO TABS: 10.0000 mg | ORAL_TABLET | Freq: Every day | ORAL | Status: DC

## 2015-01-14 NOTE — Telephone Encounter (Signed)
PATIENT AWARE

## 2015-01-14 NOTE — Telephone Encounter (Signed)
Pt need a new prescription for his Crestor 10 mg #90 and refills. Please call to [facility].

## 2015-02-02 ENCOUNTER — Other Ambulatory Visit (HOSPITAL_COMMUNITY): Payer: Self-pay | Admitting: Neurosurgery

## 2015-02-02 DIAGNOSIS — M48061 Spinal stenosis, lumbar region without neurogenic claudication: Secondary | ICD-10-CM

## 2015-02-15 ENCOUNTER — Other Ambulatory Visit (HOSPITAL_COMMUNITY): Payer: Self-pay | Admitting: Neurosurgery

## 2015-02-16 ENCOUNTER — Encounter (HOSPITAL_COMMUNITY): Payer: Self-pay

## 2015-02-16 ENCOUNTER — Ambulatory Visit (HOSPITAL_COMMUNITY)
Admission: RE | Admit: 2015-02-16 | Discharge: 2015-02-16 | Disposition: A | Discharge status: Home / Self Care | Payer: Medicare Other | Source: Ambulatory Visit | Attending: Neurosurgery | Admitting: Neurosurgery

## 2015-02-16 VITALS — BP 130/66 | HR 80 | Temp 97.8°F | Resp 20 | Ht 68.0 in | Wt 180.0 lb

## 2015-02-16 DIAGNOSIS — M545 Low back pain, unspecified: Secondary | ICD-10-CM | POA: Diagnosis present

## 2015-02-16 DIAGNOSIS — M4806 Spinal stenosis, lumbar region: Secondary | ICD-10-CM | POA: Insufficient documentation

## 2015-02-16 DIAGNOSIS — M5440 Lumbago with sciatica, unspecified side: Secondary | ICD-10-CM

## 2015-02-16 DIAGNOSIS — M48061 Spinal stenosis, lumbar region without neurogenic claudication: Secondary | ICD-10-CM

## 2015-02-16 MED ORDER — OXYCODONE HCL 5 MG PO TABS: 5.0000 mg | ORAL_TABLET | ORAL | Status: DC | PRN

## 2015-02-16 MED ORDER — DIAZEPAM 5 MG PO TABS
10.0000 mg | ORAL_TABLET | Freq: Once | ORAL | Status: AC
  Administered 2015-02-16: 10 mg via ORAL

## 2015-02-16 MED ORDER — IOHEXOL 180 MG/ML  SOLN
20.0000 mL | Freq: Once | INTRAMUSCULAR | Status: AC | PRN
  Administered 2015-02-16: 16 mL via INTRATHECAL

## 2015-02-16 MED ORDER — DIAZEPAM 5 MG PO TABS
ORAL_TABLET | ORAL | Status: AC
  Filled 2015-02-16: qty 2

## 2015-02-16 MED ORDER — ONDANSETRON HCL 4 MG/2ML IJ SOLN: 4.0000 mg | Freq: Four times a day (QID) | INTRAMUSCULAR | Status: DC | PRN

## 2015-02-16 NOTE — Discharge Instructions (Signed)

## 2015-02-26 ENCOUNTER — Other Ambulatory Visit (HOSPITAL_COMMUNITY): Payer: Self-pay | Admitting: Neurosurgery

## 2015-03-09 NOTE — Pre-Procedure Instructions (Signed)
Jonathan [name]  03/09/2015   Your procedure is scheduled on: Thursday, March 24th   Report to [facility] Admitting at  11:00 AM.   Call this number if you have problems the morning of surgery: [phone]   Remember:   Do not eat food or drink liquids after midnight Wednesday.   Take these medicines the morning of surgery with A SIP OF WATER: Nexium, Hydrocodoen, Metoprolol, Flomax.        Stop taking any herbal supplements or aspirin products 4-5 days prior to surgery.   Do not wear jewelry - no rings or watches.  Do not wear lotions or colognes.  You may NOT wear deodorant the day of surgery.   Men may shave face and neck.   Do not bring valuables to the hospital.  [facility] is not responsible for any belongings or valuables.               Contacts, dentures or bridgework may not be worn into surgery.  Leave suitcase in the car. After surgery it may be brought to your room.  For patients admitted to the hospital, discharge time is determined by your treatment team.               Name and phone number of your driver:    Special Instructions: "Preparing for Surgery" instruction sheet.   Please read over the following fact sheets that you were given: Pain Booklet, Coughing and Deep Breathing and Surgical Site Infection Prevention

## 2015-03-10 ENCOUNTER — Encounter (HOSPITAL_COMMUNITY): Payer: Self-pay

## 2015-03-10 ENCOUNTER — Encounter (HOSPITAL_COMMUNITY)
Admission: RE | Admit: 2015-03-10 | Discharge: 2015-03-10 | Disposition: A | Discharge status: Home / Self Care | Payer: 59 | Source: Ambulatory Visit | Attending: Neurosurgery | Admitting: Neurosurgery

## 2015-03-10 DIAGNOSIS — M4806 Spinal stenosis, lumbar region: Secondary | ICD-10-CM | POA: Insufficient documentation

## 2015-03-10 DIAGNOSIS — F17200 Nicotine dependence, unspecified, uncomplicated: Secondary | ICD-10-CM | POA: Insufficient documentation

## 2015-03-10 DIAGNOSIS — I10 Essential (primary) hypertension: Secondary | ICD-10-CM | POA: Diagnosis not present

## 2015-03-10 DIAGNOSIS — Z01818 Encounter for other preprocedural examination: Secondary | ICD-10-CM | POA: Insufficient documentation

## 2015-03-10 DIAGNOSIS — I2510 Atherosclerotic heart disease of native coronary artery without angina pectoris: Secondary | ICD-10-CM | POA: Diagnosis not present

## 2015-03-10 DIAGNOSIS — Z951 Presence of aortocoronary bypass graft: Secondary | ICD-10-CM | POA: Insufficient documentation

## 2015-03-10 DIAGNOSIS — I252 Old myocardial infarction: Secondary | ICD-10-CM | POA: Insufficient documentation

## 2015-03-10 DIAGNOSIS — Z01812 Encounter for preprocedural laboratory examination: Secondary | ICD-10-CM | POA: Diagnosis not present

## 2015-03-10 DIAGNOSIS — Z981 Arthrodesis status: Secondary | ICD-10-CM | POA: Diagnosis not present

## 2015-03-10 DIAGNOSIS — E785 Hyperlipidemia, unspecified: Secondary | ICD-10-CM | POA: Diagnosis not present

## 2015-03-10 HISTORY — DX: Other specified postprocedural states: Z98.890

## 2015-03-10 HISTORY — DX: Nausea with vomiting, unspecified: R11.2

## 2015-03-10 HISTORY — DX: Anemia, unspecified: D64.9

## 2015-03-10 LAB — BASIC METABOLIC PANEL
ANION GAP: 7 (ref 5–15)
BUN: 9 mg/dL (ref 6–23)
CO2: 25 mmol/L (ref 19–32)
Calcium: 9.3 mg/dL (ref 8.4–10.5)
Chloride: 105 mmol/L (ref 96–112)
Creatinine, Ser: 0.91 mg/dL (ref 0.50–1.35)
GFR, EST NON AFRICAN AMERICAN: 88 mL/min — AB (ref >90)
Glucose, Bld: 106 mg/dL — ABNORMAL HIGH (ref 70–99)
POTASSIUM: 4.4 mmol/L (ref 3.5–5.1)
SODIUM: 137 mmol/L (ref 135–145)

## 2015-03-10 LAB — CBC
HEMATOCRIT: 41.9 % (ref 39.0–52.0)
HEMOGLOBIN: 14.2 g/dL (ref 13.0–17.0)
MCH: 28.5 pg (ref 26.0–34.0)
MCHC: 33.9 g/dL (ref 30.0–36.0)
MCV: 84.1 fL (ref 78.0–100.0)
PLATELETS: 205 10*3/uL (ref 150–400)
RBC: 4.98 MIL/uL (ref 4.22–5.81)
RDW: 14.1 % (ref 11.5–15.5)
WBC: 7.1 10*3/uL (ref 4.0–10.5)

## 2015-03-10 LAB — SURGICAL PCR SCREEN
MRSA, PCR: NEGATIVE
STAPHYLOCOCCUS AUREUS: NEGATIVE

## 2015-03-10 NOTE — Progress Notes (Signed)
   03/10/15 1120  OBSTRUCTIVE SLEEP APNEA  Have you ever been diagnosed with sleep apnea through a sleep study? No  Do you snore loudly (loud enough to be heard through closed doors)?  1  Do you often feel tired, fatigued, or sleepy during the daytime? 1  Has anyone observed you stop breathing during your sleep? 0  Do you have, or are you being treated for high blood pressure? 1  BMI more than 35 kg/m2? 0  Age over 64 years old? 1  Neck circumference greater than 40 cm/16 inches? 1  Gender: 1

## 2015-03-10 NOTE — Progress Notes (Signed)
Pt has extensive cardiac hx.  LOV with Dr. Debara Pickett in October.  (note inside chart). [phone].  Currently sees Dr. Hilma Favors in [place] and will be seeing him on Tuesday.   CABG 2009 by Dr. Cyndia Bent.  Denies any chest pains or discomfort.  Marland Kitchen  DA

## 2015-03-10 NOTE — Progress Notes (Signed)
Sees Dr. Alma Friendly in October.   No complaints of chest pain or pressure.  No recent heart tests have been done.  Feeling good except for his legs.

## 2015-03-11 NOTE — Progress Notes (Signed)
Anesthesia Chart Review:  Pt is 64 year old male scheduled for L3-4, L4-5 lumbar laminectomy/decompression microdiscectomy on 03/18/2015 with Dr. Christella Noa.   PMH includes: HTN, CAD (numerous prior stents to the right coronary artery and LAD and underwent brachii therapy to the LAD), MI (2002), PAD, s/p CABG (2009), hyperlipidemia. Current smoker. BMI 27. S/p C6-7 anterior cervical decompression/discectomy fusion 10/22/14.   Preoperative labs reviewed.   Chest x-ray 10/05/2014 reviewed. No active cardiopulmonary disease.  EKG 10/08/2014: NSR.   2D echo 08/19/2013:  - Left ventricle: The cavity size was normal. Wall thickness was increased in a pattern of mild LVH. There was mild concentric hypertrophy. Systolic function was normal. The estimated ejection fraction was in the range of 55% to 60%. Wall motion was normal; there were no regional wall motion abnormalities. Left ventricular diastolic function parameters were normal. - Mitral valve: Mild regurgitation. - Left atrium: The atrium was mildly dilated. - Right ventricle: Systolic pressure was increased. - Atrial septum: No defect or patent foramen ovale was identified. - Pulmonary arteries: PA peak pressure: 27mm Hg (S).  Nuclear Stress Test 08/19/2013:  Overall Impression: Intermediate risk stress nuclear study with abnormal inferior horizontal ST depression of 61mm at peak stress, electrically diagnostic for ischemia. Nuclear images suggest a mild, fixed inferobasal defect with underlying bowel, suggesting attenuation, but no ischemia. Hypertensive response to exercise. Clinical correlation is recommended. LV Wall Motion: NL LV Function; NL Wall Motion  Pt had cardiac clearance from Dr. Debara Pickett for cervical surgery 5 months ago. As pt tolerated that surgery without complications, I anticipate pt can proceed as scheduled.   Willeen Cass, FNP-BC [facility]/Anesthesiology Phone: [phone] 03/11/2015 4:27 PM

## 2015-03-17 MED ORDER — CEFAZOLIN SODIUM-DEXTROSE 2-3 GM-% IV SOLR
2.0000 g | INTRAVENOUS | Status: AC
  Administered 2015-03-18: 2 g via INTRAVENOUS
  Filled 2015-03-17: qty 50

## 2015-03-18 ENCOUNTER — Ambulatory Visit (HOSPITAL_COMMUNITY): Payer: Medicare Other

## 2015-03-18 ENCOUNTER — Ambulatory Visit (HOSPITAL_COMMUNITY)
Admission: RE | Admit: 2015-03-18 | Discharge: 2015-03-19 | Disposition: A | Discharge status: Home / Self Care | Payer: Medicare Other | Source: Ambulatory Visit | Attending: Neurosurgery | Admitting: Neurosurgery

## 2015-03-18 ENCOUNTER — Ambulatory Visit (HOSPITAL_COMMUNITY): Payer: Medicare Other | Admitting: Anesthesiology

## 2015-03-18 ENCOUNTER — Ambulatory Visit (HOSPITAL_COMMUNITY): Payer: Medicare Other | Admitting: Emergency Medicine

## 2015-03-18 ENCOUNTER — Encounter (HOSPITAL_COMMUNITY)
Admission: RE | Disposition: A | Discharge status: Home / Self Care | Payer: Self-pay | Source: Ambulatory Visit | Attending: Neurosurgery

## 2015-03-18 ENCOUNTER — Encounter (HOSPITAL_COMMUNITY): Payer: Self-pay | Admitting: *Deleted

## 2015-03-18 DIAGNOSIS — E785 Hyperlipidemia, unspecified: Secondary | ICD-10-CM | POA: Diagnosis not present

## 2015-03-18 DIAGNOSIS — D649 Anemia, unspecified: Secondary | ICD-10-CM | POA: Insufficient documentation

## 2015-03-18 DIAGNOSIS — Z7982 Long term (current) use of aspirin: Secondary | ICD-10-CM | POA: Diagnosis not present

## 2015-03-18 DIAGNOSIS — F17290 Nicotine dependence, other tobacco product, uncomplicated: Secondary | ICD-10-CM | POA: Diagnosis not present

## 2015-03-18 DIAGNOSIS — M48061 Spinal stenosis, lumbar region without neurogenic claudication: Secondary | ICD-10-CM

## 2015-03-18 DIAGNOSIS — I10 Essential (primary) hypertension: Secondary | ICD-10-CM | POA: Diagnosis not present

## 2015-03-18 DIAGNOSIS — I2510 Atherosclerotic heart disease of native coronary artery without angina pectoris: Secondary | ICD-10-CM | POA: Diagnosis not present

## 2015-03-18 DIAGNOSIS — M48062 Spinal stenosis, lumbar region with neurogenic claudication: Secondary | ICD-10-CM | POA: Diagnosis present

## 2015-03-18 DIAGNOSIS — K219 Gastro-esophageal reflux disease without esophagitis: Secondary | ICD-10-CM | POA: Insufficient documentation

## 2015-03-18 DIAGNOSIS — Z79899 Other long term (current) drug therapy: Secondary | ICD-10-CM | POA: Insufficient documentation

## 2015-03-18 DIAGNOSIS — R200 Anesthesia of skin: Secondary | ICD-10-CM | POA: Diagnosis present

## 2015-03-18 DIAGNOSIS — I252 Old myocardial infarction: Secondary | ICD-10-CM | POA: Diagnosis not present

## 2015-03-18 DIAGNOSIS — M4806 Spinal stenosis, lumbar region: Secondary | ICD-10-CM | POA: Diagnosis not present

## 2015-03-18 DIAGNOSIS — N138 Other obstructive and reflux uropathy: Secondary | ICD-10-CM | POA: Insufficient documentation

## 2015-03-18 DIAGNOSIS — Z951 Presence of aortocoronary bypass graft: Secondary | ICD-10-CM | POA: Insufficient documentation

## 2015-03-18 DIAGNOSIS — I739 Peripheral vascular disease, unspecified: Secondary | ICD-10-CM | POA: Insufficient documentation

## 2015-03-18 DIAGNOSIS — N401 Enlarged prostate with lower urinary tract symptoms: Secondary | ICD-10-CM | POA: Insufficient documentation

## 2015-03-18 HISTORY — PX: LUMBAR LAMINECTOMY/DECOMPRESSION MICRODISCECTOMY: SHX5026

## 2015-03-18 SURGERY — LUMBAR LAMINECTOMY/DECOMPRESSION MICRODISCECTOMY 2 LEVELS
Anesthesia: General | Site: Back | Laterality: Bilateral

## 2015-03-18 MED ORDER — THROMBIN 5000 UNITS EX SOLR
CUTANEOUS | Status: DC | PRN
  Administered 2015-03-18 (×2): 5000 [IU] via TOPICAL

## 2015-03-18 MED ORDER — POTASSIUM CHLORIDE IN NACL 20-0.9 MEQ/L-% IV SOLN
INTRAVENOUS | Status: DC
  Administered 2015-03-18: 22:00:00 via INTRAVENOUS
  Filled 2015-03-18 (×3): qty 1000

## 2015-03-18 MED ORDER — ONDANSETRON HCL 4 MG/2ML IJ SOLN
INTRAMUSCULAR | Status: AC
  Filled 2015-03-18: qty 2

## 2015-03-18 MED ORDER — NEOSTIGMINE METHYLSULFATE 10 MG/10ML IV SOLN
INTRAVENOUS | Status: DC | PRN
  Administered 2015-03-18: 3 mg via INTRAVENOUS

## 2015-03-18 MED ORDER — PROPOFOL 10 MG/ML IV BOLUS
INTRAVENOUS | Status: DC | PRN
  Administered 2015-03-18: 180 mg via INTRAVENOUS

## 2015-03-18 MED ORDER — HYDROMORPHONE HCL 1 MG/ML IJ SOLN
INTRAMUSCULAR | Status: AC
  Administered 2015-03-18: 0.5 mg via INTRAVENOUS
  Filled 2015-03-18: qty 1

## 2015-03-18 MED ORDER — ASPIRIN EC 81 MG PO TBEC
81.0000 mg | DELAYED_RELEASE_TABLET | Freq: Every day | ORAL | Status: DC
  Administered 2015-03-18 – 2015-03-19 (×2): 81 mg via ORAL
  Filled 2015-03-18 (×2): qty 1

## 2015-03-18 MED ORDER — TAMSULOSIN HCL 0.4 MG PO CAPS
0.4000 mg | ORAL_CAPSULE | Freq: Every day | ORAL | Status: DC
  Administered 2015-03-19: 0.4 mg via ORAL
  Filled 2015-03-18: qty 1

## 2015-03-18 MED ORDER — MENTHOL 3 MG MT LOZG: 1.0000 | LOZENGE | OROMUCOSAL | Status: DC | PRN

## 2015-03-18 MED ORDER — PROPOFOL 10 MG/ML IV BOLUS
INTRAVENOUS | Status: AC
  Filled 2015-03-18: qty 20

## 2015-03-18 MED ORDER — OXYCODONE-ACETAMINOPHEN 5-325 MG PO TABS
ORAL_TABLET | ORAL | Status: AC
  Administered 2015-03-18: 2 via ORAL
  Filled 2015-03-18: qty 2

## 2015-03-18 MED ORDER — HYDROMORPHONE HCL 1 MG/ML IJ SOLN: 0.5000 mg | INTRAMUSCULAR | Status: DC | PRN

## 2015-03-18 MED ORDER — SODIUM CHLORIDE 0.9 % IJ SOLN
3.0000 mL | Freq: Two times a day (BID) | INTRAMUSCULAR | Status: DC
  Administered 2015-03-18 – 2015-03-19 (×2): 3 mL via INTRAVENOUS

## 2015-03-18 MED ORDER — ACETAMINOPHEN 650 MG RE SUPP: 650.0000 mg | RECTAL | Status: DC | PRN

## 2015-03-18 MED ORDER — METOPROLOL TARTRATE 50 MG PO TABS
50.0000 mg | ORAL_TABLET | Freq: Every day | ORAL | Status: DC
  Administered 2015-03-19: 50 mg via ORAL
  Filled 2015-03-18: qty 1

## 2015-03-18 MED ORDER — SODIUM CHLORIDE 0.9 % IJ SOLN: 3.0000 mL | INTRAMUSCULAR | Status: DC | PRN

## 2015-03-18 MED ORDER — PHENYLEPHRINE HCL 10 MG/ML IJ SOLN
INTRAMUSCULAR | Status: DC | PRN
  Administered 2015-03-18 (×2): 40 ug via INTRAVENOUS
  Administered 2015-03-18: 80 ug via INTRAVENOUS
  Administered 2015-03-18: 40 ug via INTRAVENOUS
  Administered 2015-03-18 (×2): 80 ug via INTRAVENOUS
  Administered 2015-03-18: 40 ug via INTRAVENOUS

## 2015-03-18 MED ORDER — SODIUM CHLORIDE 0.9 % IV SOLN: 250.0000 mL | INTRAVENOUS | Status: DC

## 2015-03-18 MED ORDER — MIDAZOLAM HCL 5 MG/5ML IJ SOLN
INTRAMUSCULAR | Status: DC | PRN
  Administered 2015-03-18: 2 mg via INTRAVENOUS

## 2015-03-18 MED ORDER — PHENOL 1.4 % MT LIQD: 1.0000 | OROMUCOSAL | Status: DC | PRN

## 2015-03-18 MED ORDER — ROSUVASTATIN CALCIUM 10 MG PO TABS
10.0000 mg | ORAL_TABLET | Freq: Every day | ORAL | Status: DC
  Administered 2015-03-18: 10 mg via ORAL
  Filled 2015-03-18 (×2): qty 1

## 2015-03-18 MED ORDER — GLYCOPYRROLATE 0.2 MG/ML IJ SOLN
INTRAMUSCULAR | Status: DC | PRN
  Administered 2015-03-18: 0.4 mg via INTRAVENOUS

## 2015-03-18 MED ORDER — FENTANYL CITRATE 0.05 MG/ML IJ SOLN
INTRAMUSCULAR | Status: DC | PRN
  Administered 2015-03-18: 50 ug via INTRAVENOUS
  Administered 2015-03-18: 100 ug via INTRAVENOUS

## 2015-03-18 MED ORDER — PROMETHAZINE HCL 25 MG/ML IJ SOLN: 6.2500 mg | INTRAMUSCULAR | Status: DC | PRN

## 2015-03-18 MED ORDER — ARTIFICIAL TEARS OP OINT
TOPICAL_OINTMENT | OPHTHALMIC | Status: AC
  Filled 2015-03-18: qty 3.5

## 2015-03-18 MED ORDER — CYCLOBENZAPRINE HCL 10 MG PO TABS
ORAL_TABLET | ORAL | Status: AC
  Administered 2015-03-18: 10 mg via ORAL
  Filled 2015-03-18: qty 1

## 2015-03-18 MED ORDER — SCOPOLAMINE 1 MG/3DAYS TD PT72
1.0000 | MEDICATED_PATCH | Freq: Once | TRANSDERMAL | Status: DC
  Administered 2015-03-18: 1.5 mg via TRANSDERMAL
  Filled 2015-03-18: qty 1

## 2015-03-18 MED ORDER — OXYCODONE HCL 5 MG/5ML PO SOLN: 5.0000 mg | Freq: Once | ORAL | Status: DC | PRN

## 2015-03-18 MED ORDER — OXYCODONE HCL 5 MG PO TABS: 5.0000 mg | ORAL_TABLET | Freq: Once | ORAL | Status: DC | PRN

## 2015-03-18 MED ORDER — 0.9 % SODIUM CHLORIDE (POUR BTL) OPTIME
TOPICAL | Status: DC | PRN
  Administered 2015-03-18: 1000 mL

## 2015-03-18 MED ORDER — LIDOCAINE HCL (CARDIAC) 20 MG/ML IV SOLN
INTRAVENOUS | Status: DC | PRN
  Administered 2015-03-18: 80 mg via INTRAVENOUS

## 2015-03-18 MED ORDER — LIDOCAINE HCL (CARDIAC) 20 MG/ML IV SOLN
INTRAVENOUS | Status: AC
  Filled 2015-03-18: qty 5

## 2015-03-18 MED ORDER — ROCURONIUM BROMIDE 50 MG/5ML IV SOLN
INTRAVENOUS | Status: AC
  Filled 2015-03-18: qty 1

## 2015-03-18 MED ORDER — HYDROCODONE-ACETAMINOPHEN 5-325 MG PO TABS: 1.0000 | ORAL_TABLET | ORAL | Status: DC | PRN

## 2015-03-18 MED ORDER — LIDOCAINE-EPINEPHRINE 0.5 %-1:200000 IJ SOLN
INTRAMUSCULAR | Status: DC | PRN
  Administered 2015-03-18: 30 mL

## 2015-03-18 MED ORDER — FENTANYL CITRATE 0.05 MG/ML IJ SOLN
INTRAMUSCULAR | Status: AC
  Filled 2015-03-18: qty 5

## 2015-03-18 MED ORDER — RAMIPRIL 10 MG PO CAPS
10.0000 mg | ORAL_CAPSULE | Freq: Every day | ORAL | Status: DC
  Administered 2015-03-18 – 2015-03-19 (×2): 10 mg via ORAL
  Filled 2015-03-18 (×2): qty 1

## 2015-03-18 MED ORDER — KETOROLAC TROMETHAMINE 30 MG/ML IJ SOLN
INTRAMUSCULAR | Status: AC
  Administered 2015-03-18: 30 mg via INTRAVENOUS
  Filled 2015-03-18: qty 1

## 2015-03-18 MED ORDER — ONDANSETRON HCL 4 MG/2ML IJ SOLN: 4.0000 mg | INTRAMUSCULAR | Status: DC | PRN

## 2015-03-18 MED ORDER — GLYCOPYRROLATE 0.2 MG/ML IJ SOLN
INTRAMUSCULAR | Status: AC
  Filled 2015-03-18: qty 2

## 2015-03-18 MED ORDER — NEOSTIGMINE METHYLSULFATE 10 MG/10ML IV SOLN
INTRAVENOUS | Status: AC
  Filled 2015-03-18: qty 1

## 2015-03-18 MED ORDER — ACETAMINOPHEN 325 MG PO TABS: 650.0000 mg | ORAL_TABLET | ORAL | Status: DC | PRN

## 2015-03-18 MED ORDER — HYDROMORPHONE HCL 1 MG/ML IJ SOLN
0.2500 mg | INTRAMUSCULAR | Status: DC | PRN
  Administered 2015-03-18 (×3): 0.5 mg via INTRAVENOUS

## 2015-03-18 MED ORDER — ROCURONIUM BROMIDE 100 MG/10ML IV SOLN
INTRAVENOUS | Status: DC | PRN
  Administered 2015-03-18: 50 mg via INTRAVENOUS

## 2015-03-18 MED ORDER — HEMOSTATIC AGENTS (NO CHARGE) OPTIME
TOPICAL | Status: DC | PRN
  Administered 2015-03-18: 1 via TOPICAL

## 2015-03-18 MED ORDER — MIDAZOLAM HCL 2 MG/2ML IJ SOLN
INTRAMUSCULAR | Status: AC
  Filled 2015-03-18: qty 2

## 2015-03-18 MED ORDER — ONDANSETRON HCL 4 MG/2ML IJ SOLN
INTRAMUSCULAR | Status: DC | PRN
  Administered 2015-03-18: 4 mg via INTRAVENOUS

## 2015-03-18 MED ORDER — PANTOPRAZOLE SODIUM 40 MG PO TBEC
80.0000 mg | DELAYED_RELEASE_TABLET | Freq: Every day | ORAL | Status: DC
Start: 2015-03-19 — End: 2015-03-19
  Administered 2015-03-19: 80 mg via ORAL
  Filled 2015-03-18: qty 2

## 2015-03-18 MED ORDER — KETOROLAC TROMETHAMINE 30 MG/ML IJ SOLN
30.0000 mg | Freq: Four times a day (QID) | INTRAMUSCULAR | Status: DC
  Administered 2015-03-18 – 2015-03-19 (×3): 30 mg via INTRAVENOUS
  Filled 2015-03-18 (×7): qty 1

## 2015-03-18 MED ORDER — PHENYLEPHRINE 40 MCG/ML (10ML) SYRINGE FOR IV PUSH (FOR BLOOD PRESSURE SUPPORT)
PREFILLED_SYRINGE | INTRAVENOUS | Status: AC
  Filled 2015-03-18: qty 10

## 2015-03-18 MED ORDER — CYCLOBENZAPRINE HCL 10 MG PO TABS
10.0000 mg | ORAL_TABLET | Freq: Three times a day (TID) | ORAL | Status: DC | PRN
  Administered 2015-03-18 – 2015-03-19 (×2): 10 mg via ORAL
  Filled 2015-03-18 (×3): qty 1

## 2015-03-18 MED ORDER — OXYCODONE-ACETAMINOPHEN 5-325 MG PO TABS
1.0000 | ORAL_TABLET | ORAL | Status: DC | PRN
  Administered 2015-03-18 – 2015-03-19 (×2): 2 via ORAL
  Filled 2015-03-18 (×2): qty 2

## 2015-03-18 MED ORDER — LACTATED RINGERS IV SOLN
INTRAVENOUS | Status: DC
  Administered 2015-03-18: 16:00:00 via INTRAVENOUS
  Administered 2015-03-18: 50 mL/h via INTRAVENOUS

## 2015-03-18 SURGICAL SUPPLY — 49 items
APL SKNCLS STERI-STRIP NONHPOA (GAUZE/BANDAGES/DRESSINGS)
BAG DECANTER FOR FLEXI CONT (MISCELLANEOUS) ×3 IMPLANT
BENZOIN TINCTURE PRP APPL 2/3 (GAUZE/BANDAGES/DRESSINGS) IMPLANT
BLADE CLIPPER SURG (BLADE) IMPLANT
BUR MATCHSTICK NEURO 3.0 LAGG (BURR) ×3 IMPLANT
BUR PRECISION FLUTE 5.0 (BURR) ×3 IMPLANT
CANISTER SUCT 3000ML PPV (MISCELLANEOUS) ×3 IMPLANT
CLOSURE WOUND 1/2 X4 (GAUZE/BANDAGES/DRESSINGS)
CONT SPEC 4OZ CLIKSEAL STRL BL (MISCELLANEOUS) ×3 IMPLANT
DECANTER SPIKE VIAL GLASS SM (MISCELLANEOUS) ×3 IMPLANT
DRAPE LAPAROTOMY 100X72X124 (DRAPES) ×3 IMPLANT
DRAPE MICROSCOPE LEICA (MISCELLANEOUS) ×3 IMPLANT
DRAPE POUCH INSTRU U-SHP 10X18 (DRAPES) ×3 IMPLANT
DRAPE SURG 17X23 STRL (DRAPES) ×3 IMPLANT
DURAPREP 26ML APPLICATOR (WOUND CARE) ×3 IMPLANT
ELECT REM PT RETURN 9FT ADLT (ELECTROSURGICAL) ×3
ELECTRODE REM PT RTRN 9FT ADLT (ELECTROSURGICAL) ×1 IMPLANT
GAUZE SPONGE 4X4 12PLY STRL (GAUZE/BANDAGES/DRESSINGS) IMPLANT
GAUZE SPONGE 4X4 16PLY XRAY LF (GAUZE/BANDAGES/DRESSINGS) IMPLANT
GLOVE ECLIPSE 6.5 STRL STRAW (GLOVE) ×3 IMPLANT
GLOVE EXAM NITRILE LRG STRL (GLOVE) IMPLANT
GLOVE EXAM NITRILE MD LF STRL (GLOVE) IMPLANT
GLOVE EXAM NITRILE XL STR (GLOVE) IMPLANT
GLOVE EXAM NITRILE XS STR PU (GLOVE) IMPLANT
GOWN STRL REUS W/ TWL LRG LVL3 (GOWN DISPOSABLE) ×2 IMPLANT
GOWN STRL REUS W/ TWL XL LVL3 (GOWN DISPOSABLE) IMPLANT
GOWN STRL REUS W/TWL 2XL LVL3 (GOWN DISPOSABLE) IMPLANT
GOWN STRL REUS W/TWL LRG LVL3 (GOWN DISPOSABLE) ×6
GOWN STRL REUS W/TWL XL LVL3 (GOWN DISPOSABLE)
KIT BASIN OR (CUSTOM PROCEDURE TRAY) ×3 IMPLANT
KIT ROOM TURNOVER OR (KITS) ×3 IMPLANT
LIQUID BAND (GAUZE/BANDAGES/DRESSINGS) ×3 IMPLANT
NEEDLE HYPO 25X1 1.5 SAFETY (NEEDLE) ×3 IMPLANT
NEEDLE SPNL 18GX3.5 QUINCKE PK (NEEDLE) IMPLANT
NS IRRIG 1000ML POUR BTL (IV SOLUTION) ×3 IMPLANT
PACK LAMINECTOMY NEURO (CUSTOM PROCEDURE TRAY) ×3 IMPLANT
PAD ARMBOARD 7.5X6 YLW CONV (MISCELLANEOUS) ×9 IMPLANT
RUBBERBAND STERILE (MISCELLANEOUS) ×6 IMPLANT
SPONGE LAP 4X18 X RAY DECT (DISPOSABLE) IMPLANT
SPONGE SURGIFOAM ABS GEL SZ50 (HEMOSTASIS) ×3 IMPLANT
STRIP CLOSURE SKIN 1/2X4 (GAUZE/BANDAGES/DRESSINGS) IMPLANT
SUT VIC AB 0 CT1 18XCR BRD8 (SUTURE) ×1 IMPLANT
SUT VIC AB 0 CT1 8-18 (SUTURE) ×3
SUT VIC AB 2-0 CT1 18 (SUTURE) ×3 IMPLANT
SUT VIC AB 3-0 SH 8-18 (SUTURE) ×3 IMPLANT
SYR 20ML ECCENTRIC (SYRINGE) ×3 IMPLANT
TOWEL OR 17X24 6PK STRL BLUE (TOWEL DISPOSABLE) ×3 IMPLANT
TOWEL OR 17X26 10 PK STRL BLUE (TOWEL DISPOSABLE) ×3 IMPLANT
WATER STERILE IRR 1000ML POUR (IV SOLUTION) ×3 IMPLANT

## 2015-03-18 NOTE — Transfer of Care (Signed)
Immediate Anesthesia Transfer of Care Note  Patient: Jonathan [name]  Procedure(s) Performed: Procedure(s) with comments: LUMBAR LAMINECTOMY/DECOMPRESSION MICRODISCECTOMY 2 LEVELS (Bilateral) - Bilateral L34 L45 laminectomies  Patient Location: PACU  Anesthesia Type:General  Level of Consciousness: awake  Airway & Oxygen Therapy: Patient Spontanous Breathing and Patient connected to nasal cannula oxygen  Post-op Assessment: Report given to RN and Patient moving all extremities X 4  Post vital signs: Reviewed and stable  Last Vitals:  Filed Vitals:   03/18/15 1133  BP: 154/61  Pulse: 67  Temp: 36.9 C  Resp: 20    Complications: No apparent anesthesia complications

## 2015-03-18 NOTE — Op Note (Signed)
03/18/2015  5:01 PM  PATIENT:  Jonathan [name]  64 y.o. male  PRE-OPERATIVE DIAGNOSIS:  lumbar stenosisn L3/4, 4/5  POST-OPERATIVE DIAGNOSIS:  lumbar stenosis L3/4, 4/5  PROCEDURE:  Procedure(s): LUMBAR LAMINECTOMY/DECOMPRESSION MICRODISCECTOMY 2 LEVELS L3/4,4/5 SURGEON: Surgeon(s): Ashok Pall, MD Consuella Lose, MD  ASSISTANTS:Nundkumar, Nena Polio  ANESTHESIA:   general  EBL:  Total I/O In: 1200 [I.V.:1200] Out: -   BLOOD ADMINISTERED:none  CELL SAVER GIVEN:none  COUNT:per nursing  DRAINS: none   SPECIMEN:  No Specimen  DICTATION: WOODSON MACHA was taken to the operating room, intubated, and Placed under a general anesthetic without difficulty.Mr. Langille was positioned supine on a wilson frame with all pressure points properly padded. He was prepped and draped in a sterile manner. I opened the skin with a 10 blade and dissected sharply to the thoracolumbar fascia. I used monopolar cautery to coagulate bleeding points in the subcutaneous tissue. I exposed the lamina of L2,3,4 and 5 bilaterally. I performed hemilaminectomies of L3, and L4, and L5. I used the drill, and Kerrison punches to remove the ligamentum flavum and bone in order to decompress the L3, and L4 nerve roots, and L5 nerve roots. I removed portions of the L3, and L4 facets to effect a satisfactory decompression of the spinal canal. Dr. Kathyrn Sheriff assisted with the decompression. We inspected the nerve roots and were once more satisfied with the decompression.   PLAN OF CARE: Admit for overnight observation  PATIENT DISPOSITION:  PACU - hemodynamically stable.   Delay start of Pharmacological VTE agent (>24hrs) due to surgical blood loss or risk of bleeding:  yes

## 2015-03-18 NOTE — H&P (Signed)
BP 154/61 mmHg  Pulse 67  Temp(Src) 98.4 F (36.9 C) (Oral)  Resp 20  Ht 5\' 8"  (1.727 m)  Wt 81.647 kg (180 lb)  BMI 27.38 kg/m2  SpO2 98%  Jonathan [name] comes in today for evaluation of the MRI of his cervical spine. What it shows is that he has a good decompression at T6-T7 where we did the operation. He had some areas above this where he is still stenotic at 4-5, at 5-6, some at 3-4, but I was not willing at his initial operation since he had a focal area of cord changes at 6-7. He said he is still having some numbness in his hands. He almost lost control of his golf club the other day. He just could not feel it in the right way. Jonathan [name], however, is actually more concerned about his lower extremities. He is having a lot of pain and numbness which he says will radiate into the lower extremities when he has to walk for any distance. He says it comes around to his belly, his belly button, and just goes down his legs. We did do an EMG of the lower extremities, which did not show [place] any signs of a peripheral neuropathy. He still complains of numbness in his feet, numbness in his legs. He is not diabetic, but said he would be willing to undergo the workup again.  We went over the readouts of the myelogram and postmyelogram CT. What it shows is that he is stenotic at 3-4 and 4-5. This certainly is there.  MEDICATIONS: Aspirin, metoprolol, Nexium, and Ramipril.  ALLERGIES: No known drug allergies.  PHYSICAL EXAMINATION: Vital signs: Height 68 inches, weight 181 pounds, BMI is 27.5, blood pressure 113/69, pulse 70, respiratory rate 14, temperature 97.4 F.  IMPRESSION/PLAN: He is certainly numb in his lower extremities. He says this numbness is something which is bothering him a great deal. It gets worse when he walks. It gets worse if he stands. The longer he walks, the worse it is. This sounds like neurogenic claudication. I do believe that the stenosis present at 3-4 and 4-5 is the most likely reason  for it. He would like to proceed with a decompression. The risks of benefits were explained; bleeding, infection, no relief, need for further surgery, CSF leak, and other risks. He understands and wishes to proceed.  Past Medical History  Diagnosis Date  . Hypertension   . Gastroesophageal reflux   . Prostate hyperplasia with urinary obstruction   . Myocardial infarction 11/2001  . CAD (coronary artery disease)   . PAD (peripheral artery disease)     bilateral iliac artery stents   . Tobacco consumption     quit cigarettes in 2008, smokes 5-6 cigars/week  . S/P CABG x 1 2009    R radial to PDA  . Hyperlipidemia   . H/O hiatal hernia   . PONV (postoperative nausea and vomiting)     most of the time he gets nauseated  . Anemia    Past Surgical History  Procedure Laterality Date  . Coronary artery bypass graft  2009    free right radial to PDA for progressive RCA disease & multiple re-stenosis (Dr. Ellison Hughs)  . Back surgery    . Transthoracic echocardiogram  07/2013    EF 55-60%, mild LVH; mild MR; LA mildly dilated; RVSP increased; PA peak pressure 45mmHg (mod pulm HTN)  . Nm myocar perf wall motion  07/2013    bruce myoview - intermediate  risk test with abnormal inferior horizontal ST depression; mild, fixed inferobasal defect, but no ischemia  . Iliac artery stent Bilateral 01/30/2002    9x63mm Cordis SmartStent to LCIA, 9x3.64mm Genesis stent to RCIA (Dr. Adora Fridge)  . Lower extremity arterial doppler  2009    R CIA stent - less than 50% diameter reduction; R CFA stent - 0-49% diameter reduction; R SFA less than 50% diameter reduction; L CFA, profunda, SFA - equal/less than 50% diameter reduction  . Anterior cervical decomp/discectomy fusion N/A 10/22/2014    Procedure: CERVICAL SIX SEVEN ANTERIOR CERVICAL DECOMPRESSION/DISCECTOMY FUSION  ;  Surgeon: Ashok Pall, MD;  Location: [facility];  Service: Neurosurgery;  Laterality: N/A;  C67 Possible C7T1 anterior cervical decompression  with fusion plating and bonegraft  . Cardiac catheterization  12/03/2001    2.5x51mm S-660 stent to RCA (MI)  . Cardiac catheterization  05/06/2002    PTCA & stenting of LAD and PTCA & stenting of mid/distal RCA with 3.0x6mm Zeta stent  . Cardiac catheterization  06/03/2002    LAD with prox 30% in-stent restenosis; ramus with 95% ostial lesion; L Cfx & 3-OMs free of disease; mild 30% RCA narrowing & 20-30% in-stent restenosis in RCA (Dr. Gerrie Nordmann)  . Cardiac catheterization  03/24/2003    overlapping tandem DES 3.0x23 Cypher stent to mid RCA for in-stent restenosis & new lesion beyond previous stent with 3.0x12 SciMed TAXUS (Dr. Marella Chimes)  . Cardiac catheterization  12/08/2003    3.0x24 TAXUS overlapping 3.0x18mm TAXUS DES in RCA and cuttin balloon atherectomy (Dr. Marella Chimes)  . Cardiac catheterization  07/10/2007    normal L main, L Cfx & 3-OMs free of disease; LAD with Zeta stent in prox region (patent); RCA with multiple stents (3.0x12 Taxus in ostium with 3.0x20 Taxus overlapping it, and series of 3.0x24 Tacus and Cypher stent overlapping this, and in distal RC 2x5x56mm Zeta(?) stent; ostium of RCA with 80% narrowing - 3x15 cutting balloon - 80% to patient w/NO obstruction (Dr. Loni Muse. Little)  . Cardiac catheterization  06/08/2008    high-grade osital & prox in-stent RCA restenosis - Dr. Marella Chimes   Family History  Problem Relation Age of Onset  . Heart disease Father   . CAD Brother     CABG  . CAD Brother     CABG   History   Social History  . Marital Status: Married    Spouse Name: N/A  . Number of Children: N/A  . Years of Education: N/A   Occupational History  . Not on file.   Social History Main Topics  . Smoking status: Former Smoker -- 1.00 packs/day for 30 years    Types: Cigarettes    Quit date: 10/05/2014  . Smokeless tobacco: Never Used     Comment: 1 pk per week - cigars  . Alcohol Use: 7.2 oz/week    12 Cans of beer per week  . Drug Use: No  . Sexual  Activity: Not on file   Other Topics Concern  . Not on file   Social History Narrative

## 2015-03-18 NOTE — Anesthesia Preprocedure Evaluation (Addendum)
Anesthesia Evaluation  Patient identified by MRN, date of birth, ID band Patient awake    Reviewed: Allergy & Precautions, NPO status , Patient's Chart, lab work & pertinent test results, reviewed documented beta blocker date and time   History of Anesthesia Complications (+) PONV and history of anesthetic complications  Airway Mallampati: II  TM Distance: >3 FB Neck ROM: Full    Dental  (+) Teeth Intact, Dental Advisory Given, Chipped,    Pulmonary Current Smoker, former smoker,    Pulmonary exam normal       Cardiovascular hypertension, Pt. on home beta blockers + CAD, + Past MI and + Peripheral Vascular Disease Rhythm:Regular  08/19/2013 Overall Impression: Intermediate risk stress nuclear study with abnormal inferior horizontal ST depression of 44mm at peak stress, electrically diagnostic for ischemia. Nuclear images suggest a mild, fixed inferobasal defect with underlying bowel, suggesting  attenuation, but no ischemia. Hypertensive response to exercise. Clinical correlation is recommended. Echo 08/19/2013: Study Conclusions  - Left ventricle: The cavity size was normal. Wall thicknesswas increased in a pattern of mild LVH. There was mildconcentric hypertrophy. Systolic function was normal. Theestimated ejection fraction was in the range of 55% to60%. Wall motion was normal; there were no regional wallmotion abnormalities.     Neuro/Psych negative psych ROS   GI/Hepatic Neg liver ROS, hiatal hernia, GERD-  Medicated and Controlled,  Endo/Other  negative endocrine ROS  Renal/GU negative Renal ROS     Musculoskeletal   Abdominal   Peds  Hematology   Anesthesia Other Findings   Reproductive/Obstetrics                       Anesthesia Physical Anesthesia Plan  ASA: III  Anesthesia Plan: General   Post-op Pain Management:    Induction: Intravenous  Airway Management Planned: Oral  ETT  Additional Equipment:   Intra-op Plan:   Post-operative Plan: Extubation in OR  Informed Consent: I have reviewed the patients History and Physical, chart, labs and discussed the procedure including the risks, benefits and alternatives for the proposed anesthesia with the patient or authorized representative who has indicated his/her understanding and acceptance.   Dental advisory given  Plan Discussed with: CRNA, Anesthesiologist and Surgeon  Anesthesia Plan Comments:        Anesthesia Quick Evaluation

## 2015-03-19 DIAGNOSIS — M4806 Spinal stenosis, lumbar region: Secondary | ICD-10-CM | POA: Diagnosis not present

## 2015-03-19 MED ORDER — HYDROCODONE-ACETAMINOPHEN 5-325 MG PO TABS: 1.0000 | ORAL_TABLET | Freq: Four times a day (QID) | ORAL | Status: DC | PRN

## 2015-03-19 MED ORDER — TIZANIDINE HCL 4 MG PO TABS: 4.0000 mg | ORAL_TABLET | Freq: Four times a day (QID) | ORAL | Status: DC | PRN

## 2015-03-19 NOTE — Discharge Summary (Signed)
  Physician Discharge Summary  Patient ID: Jonathan [name] MRN: [id number] DOB/AGE: Aug 29, 1951 64 y.o.  Admit date: 03/18/2015 Discharge date: 03/19/2015  Admission Diagnoses:Lumbar stenosis, neurogenic claudication  Discharge Diagnoses:  Active Problems:   Lumbar stenosis with neurogenic claudication   Discharged Condition: good  Hospital Course: Jonathan [name] was admitted to the hospital and taken to the operating room where he underwent a lumbar laminectomy for decompression. His wound is clean, dry, and without signs of infection. He has had some problems with voiding, but is taking his flomax.   Treatments: surgery: Lumbar semihemilaminectomy and discetomy bilaterally L4,5, and L3  Discharge Exam: Blood pressure 128/63, pulse 72, temperature 99.3 F (37.4 C), temperature source Oral, resp. rate 18, height 5\' 8"  (1.727 m), weight 81.647 kg (180 lb), SpO2 95 %. General appearance: alert, cooperative, appears stated age and no distress Neurologic: Alert and oriented X 3, normal strength and tone. Normal symmetric reflexes. Normal coordination and gait  Disposition: 01-Home or Self Care lumbar stenosis    Medication List    ASK your doctor about these medications        aspirin EC 81 MG tablet  Take 81 mg by mouth daily.     cyclobenzaprine 10 MG tablet  Commonly known as:  FLEXERIL  Take 1 tablet (10 mg total) by mouth 3 (three) times daily as needed for muscle spasms.     esomeprazole 40 MG capsule  Commonly known as:  NEXIUM  Take 1 capsule (40 mg total) by mouth daily before breakfast.     Fish Oil 1000 MG Caps  Take 1 capsule by mouth daily.     HYDROcodone-acetaminophen 5-325 MG per tablet  Commonly known as:  NORCO/VICODIN  Take 1 tablet by mouth every 6 (six) hours as needed for moderate pain.     metoprolol 50 MG tablet  Commonly known as:  LOPRESSOR  Take 1 tablet (50 mg total) by mouth daily.     ramipril 10 MG capsule  Commonly known as:  ALTACE  take 1  capsule by mouth once daily     rosuvastatin 10 MG tablet  Commonly known as:  CRESTOR  Take 1 tablet (10 mg total) by mouth at bedtime.     tamsulosin 0.4 MG Caps capsule  Commonly known as:  FLOMAX  Take 0.4 mg by mouth daily.           Follow-up Information    Follow up with Marilee Ditommaso L, MD In 3 weeks.   Specialty:  Neurosurgery   Why:  call office to make an appointment   Contact information:   [address] [place] [address] [phone]       Signed: Winfield Cunas 03/19/2015, 12:06 PM

## 2015-03-19 NOTE — Discharge Instructions (Signed)
Lumbar Discectomy °Care After °A discectomy involves removal of discmaterial (the cartilage-like structures located between the bones of the back). It is done to relieve pressure on nerve roots. It can be used as a treatment for a back problem. The time in surgery depends on the findings in surgery and what is necessary to correct the problems. °HOME CARE INSTRUCTIONS  °· Check the cut (incision) made by the surgeon twice a day for signs of infection. Some signs of infection may include:  °· A foul smelling, greenish or yellowish discharge from the wound.  °· Increased pain.  °· Increased redness over the incision (operative) site.  °· The skin edges may separate.  °· Flu-like symptoms (problems).  °· A temperature above 101.5° F (38.6° C).  °· Change your bandages in about 24 to 36 hours following surgery or as directed.  °· You may shower tomrrow.  Avoid bathtubs, swimming pools and hot tubs for three weeks or until your incision has healed completely. °· Follow your doctor's instructions as to safe activities, exercises, and physical therapy.  °· Weight reduction may be beneficial if you are overweight.  °· Daily exercise is helpful to prevent the return of problems. Walking is permitted. You may use a treadmill without an incline. Cut down on activities and exercise if you have discomfort. You may also go up and down stairs as much as you can tolerate.  °· DO NOT lift anything heavier than 10 to 15 lbs. Avoid bending or twisting at the waist. Always bend your knees when lifting.  °· Maintain strength and range of motion as instructed.  °· Do not drive for 10 days, or as directed by your doctors. You may be a passenger . Lying back in the passenger seat may be more comfortable for you. Always wear a seatbelt.  °· Limit your sitting in a regular chair to 20 to 30 minutes at a time. There are no limitations for sitting in a recliner. You should lie down or walk in between sitting periods.  °· Only take  over-the-counter or prescription medicines for pain, discomfort, or fever as directed by your caregiver.  °SEEK MEDICAL CARE IF:  °· There is increased bleeding (more than a small spot) from the wound.  °· You notice redness, swelling, or increasing pain in the wound.  °· Pus is coming from wound.  °· You develop an unexplained oral temperature above 102° F (38.9° C) develops.  °· You notice a foul smell coming from the wound or dressing.  °· You have increasing pain in your wound.  °SEEK IMMEDIATE MEDICAL CARE IF:  °· You develop a rash.  °· You have difficulty breathing.  °· You develop any allergic problems to medicines given.  °Document Released: 11/15/2004 Document Revised: 11/30/2011 Document Reviewed: 03/05/2008 °[employer]® Patient Information °

## 2015-03-19 NOTE — Progress Notes (Signed)
Patient alert and oriented, mae's well, voiding adequate amount of urine, swallowing without difficulty, no c/o pain. Patient discharged home with family. Script and discharged instructions given to patient. Patient and family stated understanding of d/c instructions given and has an appointment with MD. Aisha Alysah Carton RN 

## 2015-03-19 NOTE — Progress Notes (Signed)
PT. HAS VOIDED TWO TIMES DURING THE NIGHT BUT UNABLE TO VOID THIS AM.  BLADDER SCAN AT [facility] SHOWED 614.  IN & OUT CATH AT 0700 WAS DONE AND 500 CC OF AMBER URINE WAS OBTAINED.  PT TOLERATED PROCEDURE WELL.  [place] RN

## 2015-03-21 ENCOUNTER — Emergency Department (HOSPITAL_COMMUNITY)
Admission: EM | Admit: 2015-03-21 | Discharge: 2015-03-22 | Disposition: A | Discharge status: Home / Self Care | Payer: Medicare Other | Attending: Emergency Medicine | Admitting: Emergency Medicine

## 2015-03-21 ENCOUNTER — Encounter (HOSPITAL_COMMUNITY): Payer: Self-pay | Admitting: Emergency Medicine

## 2015-03-21 DIAGNOSIS — I10 Essential (primary) hypertension: Secondary | ICD-10-CM | POA: Insufficient documentation

## 2015-03-21 DIAGNOSIS — R34 Anuria and oliguria: Secondary | ICD-10-CM | POA: Insufficient documentation

## 2015-03-21 DIAGNOSIS — R339 Retention of urine, unspecified: Secondary | ICD-10-CM | POA: Insufficient documentation

## 2015-03-21 DIAGNOSIS — Z9861 Coronary angioplasty status: Secondary | ICD-10-CM | POA: Insufficient documentation

## 2015-03-21 DIAGNOSIS — Z951 Presence of aortocoronary bypass graft: Secondary | ICD-10-CM | POA: Diagnosis not present

## 2015-03-21 DIAGNOSIS — R3915 Urgency of urination: Secondary | ICD-10-CM | POA: Insufficient documentation

## 2015-03-21 DIAGNOSIS — Z9889 Other specified postprocedural states: Secondary | ICD-10-CM | POA: Diagnosis not present

## 2015-03-21 DIAGNOSIS — K219 Gastro-esophageal reflux disease without esophagitis: Secondary | ICD-10-CM | POA: Diagnosis not present

## 2015-03-21 DIAGNOSIS — Z862 Personal history of diseases of the blood and blood-forming organs and certain disorders involving the immune mechanism: Secondary | ICD-10-CM | POA: Diagnosis not present

## 2015-03-21 DIAGNOSIS — I2510 Atherosclerotic heart disease of native coronary artery without angina pectoris: Secondary | ICD-10-CM | POA: Diagnosis not present

## 2015-03-21 DIAGNOSIS — I252 Old myocardial infarction: Secondary | ICD-10-CM | POA: Diagnosis not present

## 2015-03-21 DIAGNOSIS — E785 Hyperlipidemia, unspecified: Secondary | ICD-10-CM | POA: Diagnosis not present

## 2015-03-21 DIAGNOSIS — Z87891 Personal history of nicotine dependence: Secondary | ICD-10-CM | POA: Diagnosis not present

## 2015-03-21 DIAGNOSIS — R109 Unspecified abdominal pain: Secondary | ICD-10-CM

## 2015-03-21 DIAGNOSIS — N401 Enlarged prostate with lower urinary tract symptoms: Secondary | ICD-10-CM | POA: Insufficient documentation

## 2015-03-21 DIAGNOSIS — Z7982 Long term (current) use of aspirin: Secondary | ICD-10-CM | POA: Insufficient documentation

## 2015-03-21 DIAGNOSIS — Z79899 Other long term (current) drug therapy: Secondary | ICD-10-CM | POA: Insufficient documentation

## 2015-03-21 LAB — URINALYSIS, ROUTINE W REFLEX MICROSCOPIC
BILIRUBIN URINE: NEGATIVE
Glucose, UA: NEGATIVE mg/dL
HGB URINE DIPSTICK: NEGATIVE
Ketones, ur: NEGATIVE mg/dL
Leukocytes, UA: NEGATIVE
NITRITE: NEGATIVE
PH: 6 (ref 5.0–8.0)
PROTEIN: NEGATIVE mg/dL
SPECIFIC GRAVITY, URINE: 1.013 (ref 1.005–1.030)
UROBILINOGEN UA: 1 mg/dL (ref 0.0–1.0)

## 2015-03-21 LAB — I-STAT CG4 LACTIC ACID, ED: LACTIC ACID, VENOUS: 0.69 mmol/L (ref 0.5–2.0)

## 2015-03-21 MED ORDER — FENTANYL CITRATE 0.05 MG/ML IJ SOLN
100.0000 ug | Freq: Once | INTRAMUSCULAR | Status: AC
Start: 2015-03-21 — End: 2015-03-22
  Administered 2015-03-22: 100 ug via INTRAVENOUS
  Filled 2015-03-21: qty 2

## 2015-03-21 NOTE — ED Provider Notes (Signed)
CSN: [id number]     Arrival date & time 03/21/15  2139 History   First MD Initiated Contact with Patient 03/21/15 2149     Chief Complaint  Patient presents with  . Urinary Retention     (Consider location/radiation/quality/duration/timing/severity/associated sxs/prior Treatment) HPI   Jonathan [name] is a 65 year old male with history significant for a recent back surgery. A posterior laminectomy for decompression. Coming in today with complaint of feelings of urinary retention. Says after the surgery on Thursday and difficulty urinating. On Friday he was cathed couple times because of urinary retention. On Saturday he was okay but today he started again feeling sensation of urinary retention. No dysuria, no abdominal pain, no nausea vomiting, no diarrhea, no fever.  Past Medical History  Diagnosis Date  . Hypertension   . Gastroesophageal reflux   . Prostate hyperplasia with urinary obstruction   . Myocardial infarction 11/2001  . CAD (coronary artery disease)   . PAD (peripheral artery disease)     bilateral iliac artery stents   . Tobacco consumption     quit cigarettes in 2008, smokes 5-6 cigars/week  . S/P CABG x 1 2009    R radial to PDA  . Hyperlipidemia   . H/O hiatal hernia   . PONV (postoperative nausea and vomiting)     most of the time he gets nauseated  . Anemia    Past Surgical History  Procedure Laterality Date  . Coronary artery bypass graft  2009    free right radial to PDA for progressive RCA disease & multiple re-stenosis (Dr. Ellison Hughs)  . Back surgery    . Transthoracic echocardiogram  07/2013    EF 55-60%, mild LVH; mild MR; LA mildly dilated; RVSP increased; PA peak pressure 73mmHg (mod pulm HTN)  . Nm myocar perf wall motion  07/2013    bruce myoview - intermediate risk test with abnormal inferior horizontal ST depression; mild, fixed inferobasal defect, but no ischemia  . Iliac artery stent Bilateral 01/30/2002    9x57mm Cordis SmartStent to LCIA,  9x3.27mm Genesis stent to RCIA (Dr. Adora Fridge)  . Lower extremity arterial doppler  2009    R CIA stent - less than 50% diameter reduction; R CFA stent - 0-49% diameter reduction; R SFA less than 50% diameter reduction; L CFA, profunda, SFA - equal/less than 50% diameter reduction  . Anterior cervical decomp/discectomy fusion N/A 10/22/2014    Procedure: CERVICAL SIX SEVEN ANTERIOR CERVICAL DECOMPRESSION/DISCECTOMY FUSION  ;  Surgeon: Ashok Pall, MD;  Location: [facility];  Service: Neurosurgery;  Laterality: N/A;  C67 Possible C7T1 anterior cervical decompression with fusion plating and bonegraft  . Cardiac catheterization  12/03/2001    2.5x55mm S-660 stent to RCA (MI)  . Cardiac catheterization  05/06/2002    PTCA & stenting of LAD and PTCA & stenting of mid/distal RCA with 3.0x40mm Zeta stent  . Cardiac catheterization  06/03/2002    LAD with prox 30% in-stent restenosis; ramus with 95% ostial lesion; L Cfx & 3-OMs free of disease; mild 30% RCA narrowing & 20-30% in-stent restenosis in RCA (Dr. Gerrie Nordmann)  . Cardiac catheterization  03/24/2003    overlapping tandem DES 3.0x23 Cypher stent to mid RCA for in-stent restenosis & new lesion beyond previous stent with 3.0x12 SciMed TAXUS (Dr. Marella Chimes)  . Cardiac catheterization  12/08/2003    3.0x24 TAXUS overlapping 3.0x9mm TAXUS DES in RCA and cuttin balloon atherectomy (Dr. Marella Chimes)  . Cardiac catheterization  07/10/2007  normal L main, L Cfx & 3-OMs free of disease; LAD with Zeta stent in prox region (patent); RCA with multiple stents (3.0x12 Taxus in ostium with 3.0x20 Taxus overlapping it, and series of 3.0x24 Tacus and Cypher stent overlapping this, and in distal RC 2x5x42mm Zeta(?) stent; ostium of RCA with 80% narrowing - 3x15 cutting balloon - 80% to patient w/NO obstruction (Dr. Loni Muse. Little)  . Cardiac catheterization  06/08/2008    high-grade osital & prox in-stent RCA restenosis - Dr. Marella Chimes   Family History  Problem  Relation Age of Onset  . Heart disease Father   . CAD Brother     CABG  . CAD Brother     CABG   History  Substance Use Topics  . Smoking status: Former Smoker -- 1.00 packs/day for 30 years    Types: Cigarettes    Quit date: 10/05/2014  . Smokeless tobacco: Never Used     Comment: 1 pk per week - cigars  . Alcohol Use: 7.2 oz/week    12 Cans of beer per week    Review of Systems  Constitutional: Negative for fever and chills.  Eyes: Negative for redness.  Respiratory: Negative for cough and shortness of breath.   Cardiovascular: Negative for chest pain.  Gastrointestinal: Negative for nausea, vomiting, abdominal pain and diarrhea.  Genitourinary: Positive for urgency and decreased urine volume. Negative for dysuria.  Skin: Negative for rash.  Neurological: Negative for headaches.  All other systems reviewed and are negative.     Allergies  Review of patient's allergies indicates no known allergies.  Home Medications   Prior to Admission medications   Medication Sig Start Date End Date Taking? Authorizing Provider  aspirin EC 81 MG tablet Take 81 mg by mouth daily.    Historical Provider, MD  cyclobenzaprine (FLEXERIL) 10 MG tablet Take 1 tablet (10 mg total) by mouth 3 (three) times daily as needed for muscle spasms. Patient not taking: Reported on 03/10/2015 10/23/14   Ashok Pall, MD  esomeprazole (NEXIUM) 40 MG capsule Take 1 capsule (40 mg total) by mouth daily before breakfast. 04/01/14   Pixie Casino, MD  HYDROcodone-acetaminophen (NORCO/VICODIN) 5-325 MG per tablet Take 1 tablet by mouth every 6 (six) hours as needed for moderate pain. Patient not taking: Reported on 02/12/2015 10/23/14   Ashok Pall, MD  HYDROcodone-acetaminophen (NORCO/VICODIN) 5-325 MG per tablet Take 1 tablet by mouth every 6 (six) hours as needed for moderate pain. 03/19/15   Ashok Pall, MD  metoprolol (LOPRESSOR) 50 MG tablet Take 1 tablet (50 mg total) by mouth daily. 12/30/14   Pixie Casino, MD  Omega-3 Fatty Acids (FISH OIL) 1000 MG CAPS Take 1 capsule by mouth daily.     Historical Provider, MD  ramipril (ALTACE) 10 MG capsule take 1 capsule by mouth once daily 10/06/14   Pixie Casino, MD  rosuvastatin (CRESTOR) 10 MG tablet Take 1 tablet (10 mg total) by mouth at bedtime. 01/14/15   Pixie Casino, MD  tamsulosin (FLOMAX) 0.4 MG CAPS capsule Take 0.4 mg by mouth daily.    Historical Provider, MD  tiZANidine (ZANAFLEX) 4 MG tablet Take 1 tablet (4 mg total) by mouth every 6 (six) hours as needed for muscle spasms. 03/19/15   Ashok Pall, MD   BP 166/57 mmHg  Pulse 100  Temp(Src) 98.5 F (36.9 C) (Oral)  Resp 16  Ht 5\' 8"  (1.727 m)  Wt 180 lb (81.647 kg)  BMI 27.38 kg/m2  SpO2 94% Physical Exam  Constitutional: He is oriented to person, place, and time. No distress.  HENT:  Head: Normocephalic and atraumatic.  Eyes: EOM are normal. Pupils are equal, round, and reactive to light.  Neck: Normal range of motion. Neck supple.  Cardiovascular: Normal rate.   Pulmonary/Chest: Effort normal. No respiratory distress.  Abdominal: Soft. There is no tenderness. There is no rebound and no guarding.  Genitourinary:  Normal rectal tone, no saddle anesthesia.  Musculoskeletal: Normal range of motion.  Normal strength in the bilateral lower extremities.  Neurological: He is alert and oriented to person, place, and time. No cranial nerve deficit.  Skin: No rash noted. He is not diaphoretic.  Psychiatric: He has a normal mood and affect.    ED Course  Procedures (including critical care time) Labs Review Labs Reviewed  URINALYSIS, ROUTINE W REFLEX MICROSCOPIC - Abnormal; Notable for the following:    Color, Urine AMBER (*)    All other components within normal limits    Imaging Review No results found.   EKG Interpretation None      MDM   Final diagnoses:  None    Jonathan [name] is a 64 year old male with history significant for a recent back surgery. A  posterior laminectomy for decompression. Coming in today with complaint of feelings of urinary retention  Exam as above.  Afebrile, HDS.  No ttp.  Prostate non-tender. Post-void residual done at bedside was 84ml.  No saddle anesthesia on exam.  Normal strength in the bilateral lower extremities.  2+ left patellar reflex, 1+ right patellar reflex.  No concern for cauda equina.  UA obtained and appears non-infected.    While in the department patient developed lower abdominal pain which was suprapubic, colicky.  He feels like it is gas pain.  On repeat exam he is ttp in his lower belly with some voluntary guarding.  Will obtain basic labs and CT to eval for possible obstructive process or diverticulitis.  Patient currently awaiting CMP and CT abdomen/pelvis.  Patient signed out to Dr. Mingo Amber at [facility], MD 03/22/15 2683  Varney Biles, MD 03/22/15 2242

## 2015-03-21 NOTE — ED Notes (Addendum)
C/o urinary retention today.  Pt states he had back surgery on Thursday.  Previous hx of same s/p surgery.

## 2015-03-21 NOTE — ED Notes (Signed)
Dr. Nanavati at bedside 

## 2015-03-22 ENCOUNTER — Encounter (HOSPITAL_COMMUNITY): Payer: Self-pay | Admitting: Neurosurgery

## 2015-03-22 ENCOUNTER — Emergency Department (HOSPITAL_COMMUNITY): Payer: Medicare Other

## 2015-03-22 LAB — COMPREHENSIVE METABOLIC PANEL
ALBUMIN: 3 g/dL — AB (ref 3.5–5.2)
ALT: 31 U/L (ref 0–53)
ANION GAP: 10 (ref 5–15)
AST: 34 U/L (ref 0–37)
Alkaline Phosphatase: 105 U/L (ref 39–117)
BUN: 12 mg/dL (ref 6–23)
CHLORIDE: 104 mmol/L (ref 96–112)
CO2: 23 mmol/L (ref 19–32)
Calcium: 8.8 mg/dL (ref 8.4–10.5)
Creatinine, Ser: 0.96 mg/dL (ref 0.50–1.35)
GFR, EST NON AFRICAN AMERICAN: 86 mL/min — AB (ref >90)
Glucose, Bld: 113 mg/dL — ABNORMAL HIGH (ref 70–99)
POTASSIUM: 3.9 mmol/L (ref 3.5–5.1)
Sodium: 137 mmol/L (ref 135–145)
Total Bilirubin: 1.1 mg/dL (ref 0.3–1.2)
Total Protein: 6.3 g/dL (ref 6.0–8.3)

## 2015-03-22 LAB — CBC WITH DIFFERENTIAL/PLATELET
BASOS ABS: 0 10*3/uL (ref 0.0–0.1)
Basophils Relative: 0 % (ref 0–1)
Eosinophils Absolute: 0.3 10*3/uL (ref 0.0–0.7)
Eosinophils Relative: 4 % (ref 0–5)
HEMATOCRIT: 33.7 % — AB (ref 39.0–52.0)
HEMOGLOBIN: 11.4 g/dL — AB (ref 13.0–17.0)
LYMPHS ABS: 2.1 10*3/uL (ref 0.7–4.0)
LYMPHS PCT: 26 % (ref 12–46)
MCH: 28.6 pg (ref 26.0–34.0)
MCHC: 33.8 g/dL (ref 30.0–36.0)
MCV: 84.5 fL (ref 78.0–100.0)
MONO ABS: 0.7 10*3/uL (ref 0.1–1.0)
Monocytes Relative: 9 % (ref 3–12)
NEUTROS ABS: 5 10*3/uL (ref 1.7–7.7)
Neutrophils Relative %: 61 % (ref 43–77)
Platelets: 171 10*3/uL (ref 150–400)
RBC: 3.99 MIL/uL — AB (ref 4.22–5.81)
RDW: 13.6 % (ref 11.5–15.5)
WBC: 8.1 10*3/uL (ref 4.0–10.5)

## 2015-03-22 LAB — I-STAT CG4 LACTIC ACID, ED: Lactic Acid, Venous: 0.58 mmol/L (ref 0.5–2.0)

## 2015-03-22 MED ORDER — GADOBENATE DIMEGLUMINE 529 MG/ML IV SOLN
15.0000 mL | Freq: Once | INTRAVENOUS | Status: AC | PRN
  Administered 2015-03-22: 15 mL via INTRAVENOUS

## 2015-03-22 MED ORDER — POLYETHYLENE GLYCOL 3350 17 G PO PACK: 17.0000 g | PACK | Freq: Two times a day (BID) | ORAL | Status: DC

## 2015-03-22 MED ORDER — OXYCODONE-ACETAMINOPHEN 5-325 MG PO TABS
2.0000 | ORAL_TABLET | Freq: Once | ORAL | Status: AC
  Administered 2015-03-22: 2 via ORAL
  Filled 2015-03-22: qty 2

## 2015-03-22 NOTE — ED Notes (Signed)
Patient transported to MRI 

## 2015-03-22 NOTE — ED Notes (Signed)
Patient appeared to be in significantly more pain than he was initially.  Rates pain 10/10 in lower abdomen/bladder region.  Informed Dr. Bess Harvest of this change in status.  Dr. Bess Harvest to see patient, place orders.

## 2015-03-22 NOTE — ED Notes (Signed)
Patient transported to CT 

## 2015-03-22 NOTE — ED Notes (Signed)
The pt appears comfortable.  Wife at the bedside

## 2015-03-22 NOTE — ED Notes (Signed)
The pts pain is almost gone

## 2015-03-22 NOTE — ED Notes (Signed)
EDP at bedside  

## 2015-03-22 NOTE — ED Provider Notes (Signed)
0100 - Care from Dr. Kathrynn Humble. Awaiting CT of his abdomen since patient had some abdominal pain while here. Patient is 3 days post a L3-L4 laminectomy. He is also having some urinary retention. CT shows engorged urinary bladder. After 30 minutes of struggling to urinate, he is only able to urinate 180 mL. An in and out cath revealed a neck returned 20 mL. Plan on MRI to rule out urinary retention. MRI shows some canal stenosis but no acute cord compression. I spell with Dr. Ellene Route concerning the MRI, he does not think the urinary retention is secondary to cord compression or cauda equina syndrome. He will speak with the patient's primary neurosurgeon. Patient had Foley placed for his urinary retention. Given follow-up instructions. Stable for discharge.  1. Abdominal pain, unspecified abdominal location   2. Urinary retention      Evelina Bucy, MD 03/22/15 [phone]

## 2015-03-22 NOTE — Discharge Instructions (Signed)
Thank you for allowing [place] to take care of you today.  Please follow up with your primary care doctor and at your previously scheduled neurosurgery appointment.  Abdominal Pain Many things can cause abdominal pain. Usually, abdominal pain is not caused by a disease and will improve without treatment. It can often be observed and treated at home. Your health care provider will do a physical exam and possibly order blood tests and X-rays to help determine the seriousness of your pain. However, in many cases, more time must pass before a clear cause of the pain can be found. Before that point, your health care provider may not know if you need more testing or further treatment. HOME CARE INSTRUCTIONS  Monitor your abdominal pain for any changes. The following actions may help to alleviate any discomfort you are experiencing:  Only take over-the-counter or prescription medicines as directed by your health care provider.  Do not take laxatives unless directed to do so by your health care provider.  Try a clear liquid diet (broth, tea, or water) as directed by your health care provider. Slowly move to a bland diet as tolerated. SEEK MEDICAL CARE IF:  You have unexplained abdominal pain.  You have abdominal pain associated with nausea or diarrhea.  You have pain when you urinate or have a bowel movement.  You experience abdominal pain that wakes you in the night.  You have abdominal pain that is worsened or improved by eating food.  You have abdominal pain that is worsened with eating fatty foods.  You have a fever. SEEK IMMEDIATE MEDICAL CARE IF:   Your pain does not go away within 2 hours.  You keep throwing up (vomiting).  Your pain is felt only in portions of the abdomen, such as the right side or the left lower portion of the abdomen.  You pass bloody or black tarry stools. MAKE SURE YOU:  Understand these instructions.   Will watch your condition.   Will get help right away  if you are not doing well or get worse.  Document Released: 09/20/2005 Document Revised: 12/16/2013 Document Reviewed: 08/20/2013 [facility] Patient Information 2015 [place], [place]. This information is not intended to replace advice given to you by your health care provider. Make sure you discuss any questions you have with your health care provider.  Acute Urinary Retention Acute urinary retention is the temporary inability to urinate. This is a common problem in older men. As men age their prostates become larger and block the flow of urine from the bladder. This is usually a problem that has come on gradually.  HOME CARE INSTRUCTIONS If you are sent home with a Foley catheter and a drainage system, you will need to discuss the best course of action with your health care provider. While the catheter is in, maintain a good intake of fluids. Keep the drainage bag emptied and lower than your catheter. This is so that contaminated urine will not flow back into your bladder, which could lead to a urinary tract infection. There are two main types of drainage bags. One is a large bag that usually is used at night. It has a good capacity that will allow you to sleep through the night without having to empty it. The second type is called a leg bag. It has a smaller capacity, so it needs to be emptied more frequently. However, the main advantage is that it can be attached by a leg strap and can go underneath your clothing, allowing you  the freedom to move about or leave your home. Only take over-the-counter or prescription medicines for pain, discomfort, or fever as directed by your health care provider.  SEEK MEDICAL CARE IF:  You develop a low-grade fever.  You experience spasms or leakage of urine with the spasms. SEEK IMMEDIATE MEDICAL CARE IF:   You develop chills or fever.  Your catheter stops draining urine.  Your catheter falls out.  You start to develop increased bleeding that does not  respond to rest and increased fluid intake. MAKE SURE YOU:  Understand these instructions.  Will watch your condition.  Will get help right away if you are not doing well or get worse. Document Released: 03/19/2001 Document Revised: 12/16/2013 Document Reviewed: 05/22/2013 [facility] Patient Information 2015 [place], [place]. This information is not intended to replace advice given to you by your health care provider. Make sure you discuss any questions you have with your health care provider.

## 2015-03-28 NOTE — Anesthesia Postprocedure Evaluation (Signed)
Anesthesia Post Note  Patient: Jonathan [name]  Procedure(s) Performed: Procedure(s) (LRB): LUMBAR LAMINECTOMY/DECOMPRESSION MICRODISCECTOMY 2 LEVELS (Bilateral)  Anesthesia type: general  Patient location: PACU  Post pain: Pain level controlled  Post assessment: Patient's Cardiovascular Status Stable  Post vital signs: Reviewed and stable  Level of consciousness: sedated  Complications: No apparent anesthesia complications

## 2015-04-12 ENCOUNTER — Other Ambulatory Visit: Payer: Self-pay

## 2015-04-12 ENCOUNTER — Telehealth: Payer: Self-pay | Admitting: Internal Medicine

## 2015-04-12 MED ORDER — METOPROLOL TARTRATE 50 MG PO TABS: 50.0000 mg | ORAL_TABLET | Freq: Every day | ORAL | Status: DC

## 2015-04-12 NOTE — Telephone Encounter (Signed)
Medication Detail      Disp Refills Start End     metoprolol (LOPRESSOR) 50 MG tablet 90 tablet 2 12/30/2014     Sig - Route: Take 1 tablet (50 mg total) by mouth daily. - Oral    E-Prescribing Status: Receipt confirmed by pharmacy (12/30/2014 4:11 PM EST)      Spoke with patient and verified medications, dose, frequency.  Informed him Rx was sent in in Jan.  Spoke with pharmacy staff and metoprolol (and ramipril) has been ready for pick up since 4/14.  Patient was notified.

## 2015-04-12 NOTE — Telephone Encounter (Signed)
°  1. Which medications need to be refilled? Metoprolol-please call it in today,if possible  2. Which pharmacy is medication to be sent to?[facility] [phone]  3. Do they need a 30 day or 90 day supply? 90 and refills  4. Would they like a call back once the medication has been sent to the pharmacy? yes

## 2015-04-12 NOTE — Telephone Encounter (Signed)
Rx(s) sent to pharmacy electronically.  

## 2015-09-08 IMAGING — CR DG LUMBAR SPINE 2-3V
2 series · 2 of 2 positions shown · non-contrast
Comparison: CT lumbar spine dated 02/16/2014

CLINICAL DATA: L3-5 laminectomy

EXAM:
LUMBAR SPINE - 2-3 VIEW

[lat (1 of 2)]
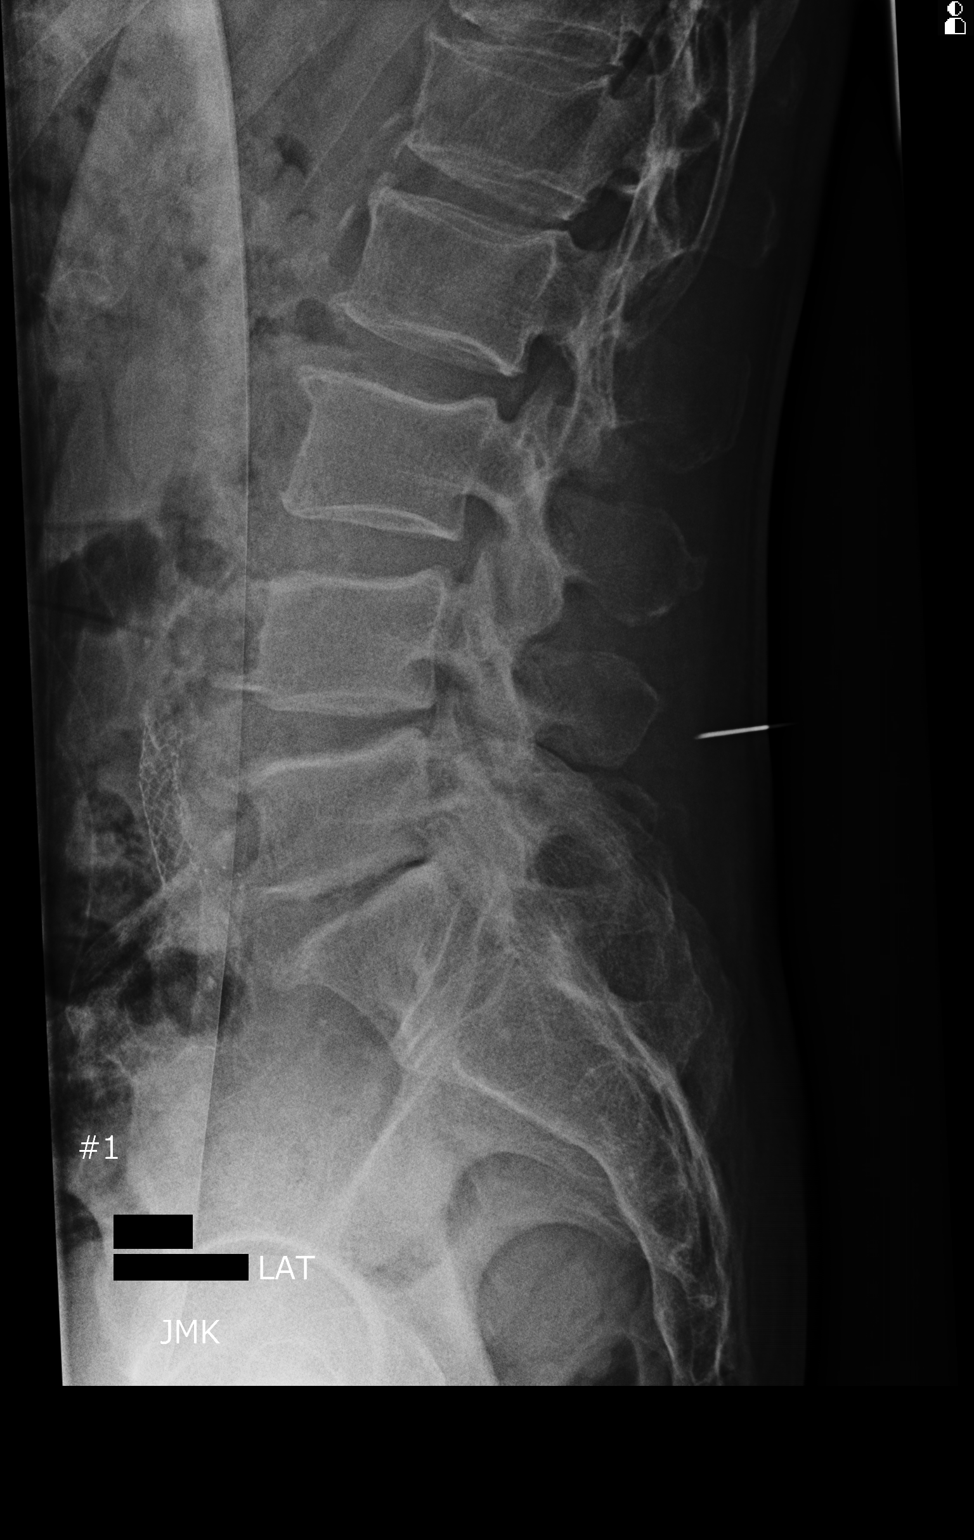

[lat (2 of 2)]
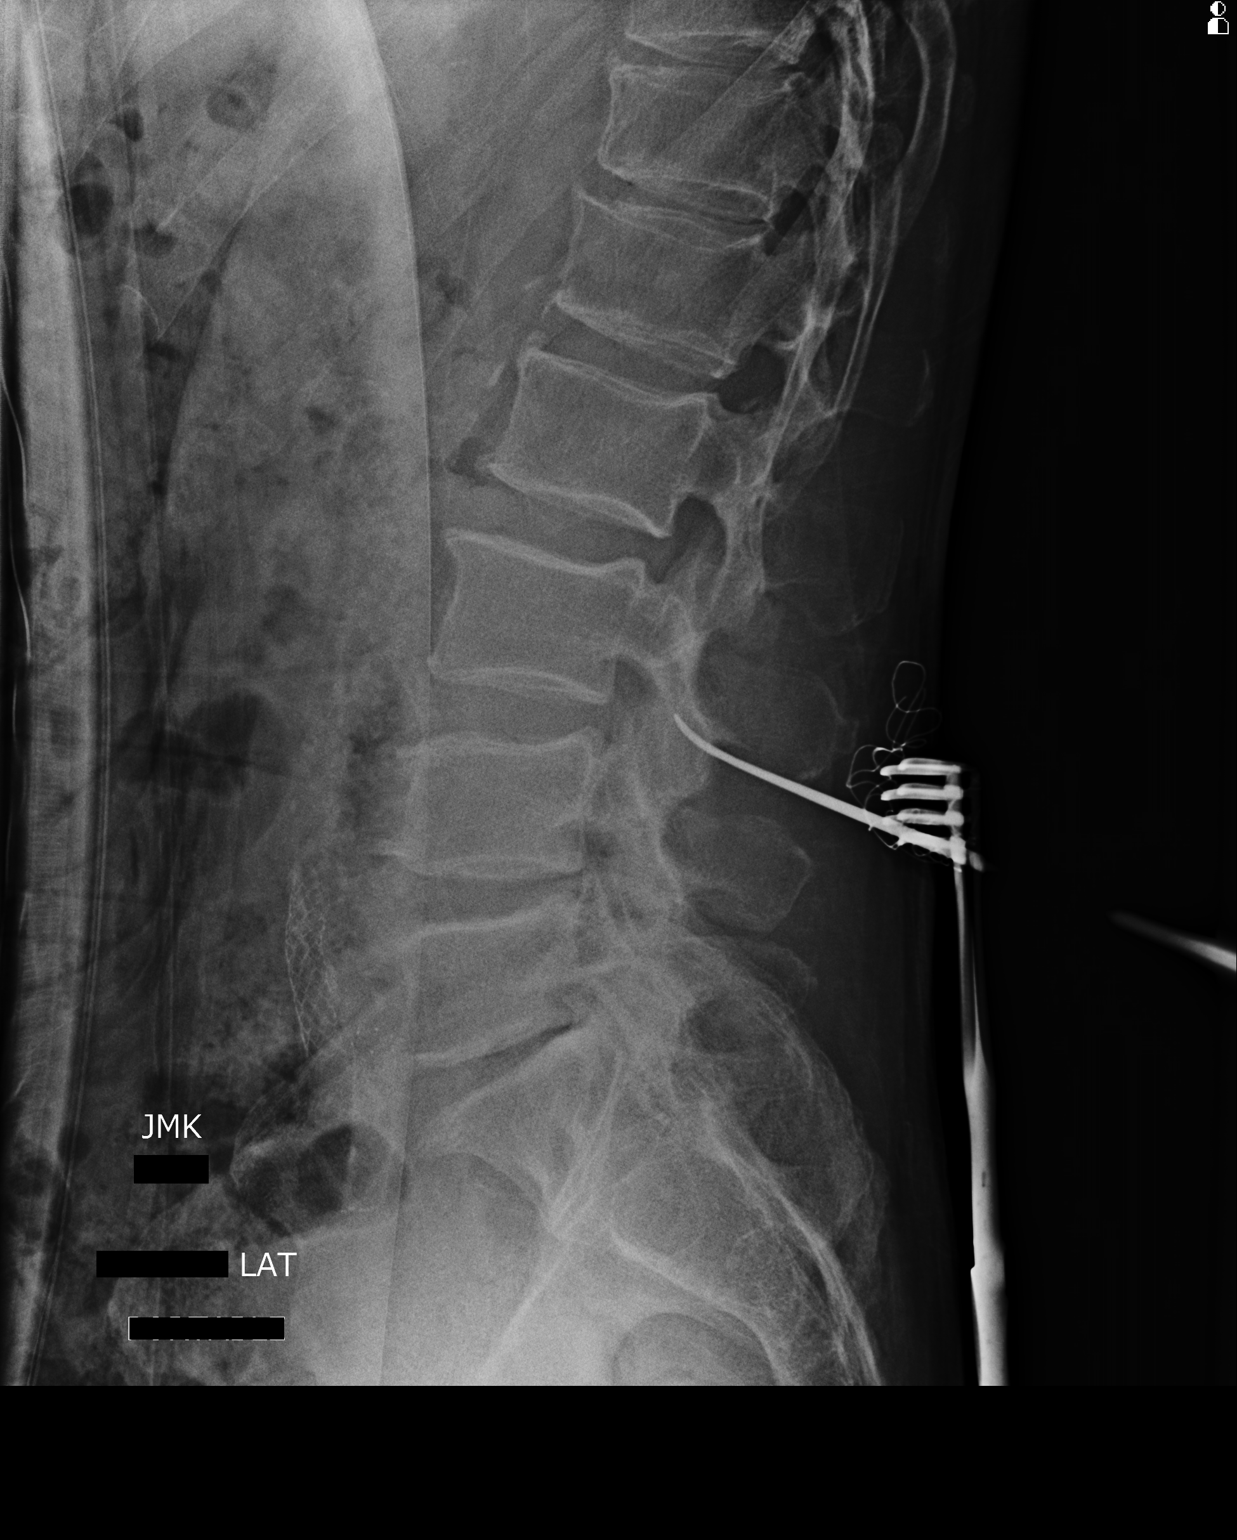

[2 of 2 positions shown; findings below may reference images not displayed]

FINDINGS: Initial lumbar spine radiograph demonstrates a surgical probe at
L4-5.

Second lumbar spine radiograph demonstrates a surgical probe at
L3-4.
IMPRESSION: Lumbar localization as above.

## 2015-09-21 ENCOUNTER — Encounter: Payer: Self-pay | Admitting: Internal Medicine

## 2015-09-30 ENCOUNTER — Telehealth: Payer: Self-pay

## 2015-09-30 NOTE — Telephone Encounter (Signed)
[phone] PATIENT RECEIVED LETTER TO SCHEDULE TCS   PLEASE LEAVE A MESSAGE IF HE IS NOT THERE

## 2015-10-01 NOTE — Telephone Encounter (Signed)
Called and pt not available at the moment.

## 2015-10-05 NOTE — Telephone Encounter (Signed)
I called and pt is not available. Wife will have him call tomorrow between 1:30 and 4:30.

## 2015-10-14 NOTE — Telephone Encounter (Signed)
LMOM to call and mailing a letter reminder also.

## 2015-10-27 ENCOUNTER — Telehealth: Payer: Self-pay

## 2015-10-27 NOTE — Telephone Encounter (Signed)
Pt received a triage letter from DS. Please call him back at [phone]

## 2015-10-28 NOTE — Telephone Encounter (Signed)
I called pt. He has been having some lower abdominal pain for awhile. OV with Neil Crouch, PA on 11/15/2015 at 11:00 AM.

## 2015-11-15 ENCOUNTER — Encounter: Payer: Self-pay | Admitting: Gastroenterology

## 2015-11-15 ENCOUNTER — Ambulatory Visit (INDEPENDENT_AMBULATORY_CARE_PROVIDER_SITE_OTHER): Payer: Medicare Other | Admitting: Gastroenterology

## 2015-11-15 ENCOUNTER — Other Ambulatory Visit: Payer: Self-pay

## 2015-11-15 VITALS — BP 137/68 | HR 81 | Temp 97.4°F | Ht 68.0 in | Wt 178.2 lb

## 2015-11-15 DIAGNOSIS — K219 Gastro-esophageal reflux disease without esophagitis: Secondary | ICD-10-CM

## 2015-11-15 DIAGNOSIS — R109 Unspecified abdominal pain: Secondary | ICD-10-CM | POA: Insufficient documentation

## 2015-11-15 DIAGNOSIS — R1033 Periumbilical pain: Secondary | ICD-10-CM

## 2015-11-15 DIAGNOSIS — IMO0001 Reserved for inherently not codable concepts without codable children: Secondary | ICD-10-CM

## 2015-11-15 DIAGNOSIS — Z1211 Encounter for screening for malignant neoplasm of colon: Secondary | ICD-10-CM

## 2015-11-15 DIAGNOSIS — R911 Solitary pulmonary nodule: Secondary | ICD-10-CM | POA: Diagnosis not present

## 2015-11-15 MED ORDER — PEG 3350-KCL-NA BICARB-NACL 420 G PO SOLR
4000.0000 mL | ORAL | Status: DC
Start: 2015-11-15 — End: 2016-01-21

## 2015-11-15 NOTE — Assessment & Plan Note (Signed)
Couple year history of abdominal pain across the midline like a tight belt. Not associated with eating, bowel habits. Seems to be worse with urinary retention. Cannot exclude referred back pain. Proceed with screening colonoscopy as scheduled. Phenergan 25 mg IV 30 minutes before the procedure given his history of alcohol consumption.  I have discussed the risks, alternatives, benefits with regards to but not limited to the risk of reaction to medication, bleeding, infection, perforation and the patient is agreeable to proceed. Written consent to be obtained.  If he becomes irregular prior to his procedure, he will take Dulcolax 15 mg by mouth daily for 3 days prior to bowel prep.  Based on colonoscopy findings, patient may require urological evaluation and/or CT imaging with contrast.

## 2015-11-15 NOTE — Progress Notes (Signed)
Primary Care Physician:  Purvis Kilts, MD  Primary Gastroenterologist:  Garfield Cornea, MD   Chief Complaint  Patient presents with  . set up TCS    HPI:  Jonathan [name] is a 64 y.o. male here to schedule a colonoscopy. He has chronic GERD and two year history of mid abdominal pain.   No prior TCS. Feels like tight belt around the waist for two years. Pressure into the groin. Pain was worse postoperatively after both his back surgeries. Notes he had trouble with urinary retention after anesthesia. Pain is not brought on by meals. Does not improved with bowel movement. Pain is constant. 2 out of 10 on a pain scale. Somewhat better if he walks. Worse of the past several months. Notes that he has difficulty emptying his bladder. Gets a good stream only if he takes his Flomax and has a couple of beers. Bowel movements are regular. No blood in the stool or melena. Denies weight change.  Reports remote upper endoscopy. Typical heartburn well-controlled on over-the-counter Nexium. He wonders if he should continue taking it chronically. Denies esophageal dysphagia, vomiting. Appetite is good.  Back surgery in March 2016. 4 days post surgery he had difficulty with urinary retention. CT of the abdomen and pelvis without contrast showed subpleural 5 mm nodule right middle lobe. Nonspecific bilateral perinephric stranding. Both ureters are decompressed. Moderate volume of colonic stool. Iliac stents seen bilaterally. Prostate appeared normal. MRI lumbar spine was also done postoperatively which showed partial decompressive laminectomies at L3 and L4. Slight worsened canal stenosis at L4-L5 with thecal sac measuring 5 mm in transverse diameter. Improved canal stenosis at L3-L4.  Current Outpatient Prescriptions  Medication Sig Dispense Refill  . aspirin EC 81 MG tablet Take 81 mg by mouth daily.    Marland Kitchen esomeprazole (NEXIUM) 20 MG capsule Take 20 mg by mouth daily at 12 noon.    . metoprolol (LOPRESSOR)  50 MG tablet Take 1 tablet (50 mg total) by mouth daily. 90 tablet 1  . Omega-3 Fatty Acids (FISH OIL) 1000 MG CAPS Take 1 capsule by mouth daily.     . ramipril (ALTACE) 10 MG capsule take 1 capsule by mouth once daily 90 capsule 3  . rosuvastatin (CRESTOR) 10 MG tablet Take 1 tablet (10 mg total) by mouth at bedtime. 90 tablet 3  . tamsulosin (FLOMAX) 0.4 MG CAPS capsule Take 0.4 mg by mouth daily.     No current facility-administered medications for this visit.    Allergies as of 11/15/2015  . (No Known Allergies)    Past Medical History  Diagnosis Date  . Hypertension   . Gastroesophageal reflux   . Prostate hyperplasia with urinary obstruction   . Myocardial infarction ([place]) 11/2001  . CAD (coronary artery disease)   . PAD (peripheral artery disease) ([facility])     bilateral iliac artery stents   . Tobacco consumption     quit cigarettes in 2008, smokes 5-6 cigars/week  . S/P CABG x 1 2009    R radial to PDA  . Hyperlipidemia   . H/O hiatal hernia   . PONV (postoperative nausea and vomiting)     most of the time he gets nauseated  . Anemia   . Femoral artery stenosis [facility])     Past Surgical History  Procedure Laterality Date  . Coronary artery bypass graft  2009    free right radial to PDA for progressive RCA disease & multiple re-stenosis (Dr. Ellison Hughs)  . Back  surgery    . Transthoracic echocardiogram  07/2013    EF 55-60%, mild LVH; mild MR; LA mildly dilated; RVSP increased; PA peak pressure 33mmHg (mod pulm HTN)  . Nm myocar perf wall motion  07/2013    bruce myoview - intermediate risk test with abnormal inferior horizontal ST depression; mild, fixed inferobasal defect, but no ischemia  . Iliac artery stent Bilateral 01/30/2002    9x53mm Cordis SmartStent to LCIA, 9x3.29mm Genesis stent to RCIA (Dr. Adora Fridge)  . Lower extremity arterial doppler  2009    R CIA stent - less than 50% diameter reduction; R CFA stent - 0-49% diameter reduction; R SFA less than 50% diameter  reduction; L CFA, profunda, SFA - equal/less than 50% diameter reduction  . Anterior cervical decomp/discectomy fusion N/A 10/22/2014    Procedure: CERVICAL SIX SEVEN ANTERIOR CERVICAL DECOMPRESSION/DISCECTOMY FUSION  ;  Surgeon: Ashok Pall, MD;  Location: [place] [facility];  Service: Neurosurgery;  Laterality: N/A;  C67 Possible C7T1 anterior cervical decompression with fusion plating and bonegraft  . Cardiac catheterization  12/03/2001    2.5x105mm S-660 stent to RCA (MI)  . Cardiac catheterization  05/06/2002    PTCA & stenting of LAD and PTCA & stenting of mid/distal RCA with 3.0x38mm Zeta stent  . Cardiac catheterization  06/03/2002    LAD with prox 30% in-stent restenosis; ramus with 95% ostial lesion; L Cfx & 3-OMs free of disease; mild 30% RCA narrowing & 20-30% in-stent restenosis in RCA (Dr. Gerrie Nordmann)  . Cardiac catheterization  03/24/2003    overlapping tandem DES 3.0x23 Cypher stent to mid RCA for in-stent restenosis & new lesion beyond previous stent with 3.0x12 SciMed TAXUS (Dr. Marella Chimes)  . Cardiac catheterization  12/08/2003    3.0x24 TAXUS overlapping 3.0x16mm TAXUS DES in RCA and cuttin balloon atherectomy (Dr. Marella Chimes)  . Cardiac catheterization  07/10/2007    normal L main, L Cfx & 3-OMs free of disease; LAD with Zeta stent in prox region (patent); RCA with multiple stents (3.0x12 Taxus in ostium with 3.0x20 Taxus overlapping it, and series of 3.0x24 Tacus and Cypher stent overlapping this, and in distal RC 2x5x13mm Zeta(?) stent; ostium of RCA with 80% narrowing - 3x15 cutting balloon - 80% to patient w/NO obstruction (Dr. Loni Muse. Little)  . Cardiac catheterization  06/08/2008    high-grade osital & prox in-stent RCA restenosis - Dr. Marella Chimes  . Lumbar laminectomy/decompression microdiscectomy Bilateral 03/18/2015    Procedure: LUMBAR LAMINECTOMY/DECOMPRESSION MICRODISCECTOMY 2 LEVELS;  Surgeon: Ashok Pall, MD;  Location: [place] [facility];  Service: Neurosurgery;  Laterality:  Bilateral;  Bilateral L34 L45 laminectomies    Family History  Problem Relation Age of Onset  . Heart disease Father   . CAD Brother     CABG  . CAD Brother     CABG  . Colon cancer Neg Hx     Social History   Social History  . Marital Status: Married    Spouse Name: N/A  . Number of Children: 2  . Years of Education: N/A   Occupational History  . disability    Social History Main Topics  . Smoking status: Current Some Day Smoker -- 1.00 packs/day for 30 years    Types: Cigarettes    Last Attempt to Quit: 10/05/2014  . Smokeless tobacco: Never Used     Comment: 1 pk per week - cigars  . Alcohol Use: 7.2 oz/week    12 Cans of beer per week  Comment: beer 2-3 times per week  . Drug Use: No  . Sexual Activity: Not on file   Other Topics Concern  . Not on file   Social History Narrative      ROS:  General: Negative for anorexia, weight loss, fever, chills, fatigue, weakness. Eyes: Negative for vision changes.  ENT: Negative for hoarseness, difficulty swallowing , nasal congestion. CV: Negative for chest pain, angina, palpitations, dyspnea on exertion, peripheral edema.  Respiratory: Negative for dyspnea at rest, dyspnea on exertion, cough, sputum, wheezing.  GI: See history of present illness. GU:  Negative for dysuria, hematuria, urinary incontinence. Positive urinary frequency, positive nocturnal urination.  MS: Negative for joint pain, low back pain.  Derm: Negative for rash or itching.  Neuro: Negative for weakness, abnormal sensation, seizure, frequent headaches, memory loss, confusion.  Psych: Negative for anxiety, depression, suicidal ideation, hallucinations.  Endo: Negative for unusual weight change.  Heme: Negative for bruising or bleeding. Allergy: Negative for rash or hives.    Physical Examination:  BP 137/68 mmHg  Pulse 81  Temp(Src) 97.4 F (36.3 C)  Ht 5\' 8"  (1.727 m)  Wt 178 lb 3.2 oz (80.831 kg)  BMI 27.10 kg/m2   General:  Well-nourished, well-developed in no acute distress.  Head: Normocephalic, atraumatic.   Eyes: Conjunctiva pink, no icterus. Mouth: Oropharyngeal mucosa moist and pink , no lesions erythema or exudate. Neck: Supple without thyromegaly, masses, or lymphadenopathy.  Lungs: Clear to auscultation bilaterally.  Heart: Regular rate and rhythm, no murmurs rubs or gallops.  Abdomen: Bowel sounds are normal, mild suprapubic tenderness, nondistended, no hepatosplenomegaly or masses, no abdominal bruits or    hernia , no rebound or guarding.   Rectal: Deferred Extremities: No lower extremity edema. No clubbing or deformities.  Neuro: Alert and oriented x 4 , grossly normal neurologically.  Skin: Warm and dry, no rash or jaundice.   Psych: Alert and cooperative, normal mood and affect.  Labs: Postop labs Lab Results  Component Value Date   WBC 8.1 03/21/2015   HGB 11.4* 03/21/2015   HCT 33.7* 03/21/2015   MCV 84.5 03/21/2015   PLT 171 03/21/2015   Lab Results  Component Value Date   CREATININE 0.96 03/21/2015   BUN 12 03/21/2015   NA 137 03/21/2015   K 3.9 03/21/2015   CL 104 03/21/2015   CO2 23 03/21/2015   Lab Results  Component Value Date   ALT 31 03/21/2015   AST 34 03/21/2015   ALKPHOS 105 03/21/2015   BILITOT 1.1 03/21/2015     Imaging Studies: No results found.

## 2015-11-15 NOTE — Assessment & Plan Note (Signed)
Plan on follow-up chest CT after colonoscopy. Based on colonoscopy findings he may or may not need further abdominal pelvic imaging therefore will await procedure findings before scheduling chest CT

## 2015-11-15 NOTE — Assessment & Plan Note (Signed)
Heartburn well-controlled with over-the-counter Nexium. Describes remote upper endoscopy, we have requested records for review. He wants to try to come off of medication. Advised that he can stop medication but if he has recurrent heartburn/GERD symptoms more than 3 times per week he should resume medication. Voiced understanding.

## 2015-11-15 NOTE — Progress Notes (Signed)
cc'ed to pcp °

## 2015-11-15 NOTE — Patient Instructions (Signed)
1. Colonoscopy as scheduled. See separate instructions. If you are not having regular daily BMs the week of your procedure, you need to take Dulcolax 15mg  (three tablets) daily for few days to get things started.  2. After your colonoscopy, we will plan on Chest CT to follow up lung nodule and whatever else is need for further evaluation of your abdominal pain.  3. You can try coming off Nexium but if you have recurrent heartburn/indigestion more than 3 times per week, you should stay on it.

## 2015-11-22 ENCOUNTER — Encounter (HOSPITAL_COMMUNITY)
Admission: RE | Disposition: A | Discharge status: Home / Self Care | Payer: Self-pay | Source: Ambulatory Visit | Attending: Internal Medicine

## 2015-11-22 ENCOUNTER — Ambulatory Visit (HOSPITAL_COMMUNITY)
Admission: RE | Admit: 2015-11-22 | Discharge: 2015-11-22 | Disposition: A | Discharge status: Home / Self Care | Payer: Medicare Other | Source: Ambulatory Visit | Attending: Internal Medicine | Admitting: Internal Medicine

## 2015-11-22 ENCOUNTER — Encounter (HOSPITAL_COMMUNITY): Payer: Self-pay | Admitting: *Deleted

## 2015-11-22 DIAGNOSIS — D125 Benign neoplasm of sigmoid colon: Secondary | ICD-10-CM | POA: Diagnosis not present

## 2015-11-22 DIAGNOSIS — K621 Rectal polyp: Secondary | ICD-10-CM

## 2015-11-22 DIAGNOSIS — I10 Essential (primary) hypertension: Secondary | ICD-10-CM | POA: Insufficient documentation

## 2015-11-22 DIAGNOSIS — Z951 Presence of aortocoronary bypass graft: Secondary | ICD-10-CM | POA: Diagnosis not present

## 2015-11-22 DIAGNOSIS — Z79899 Other long term (current) drug therapy: Secondary | ICD-10-CM | POA: Insufficient documentation

## 2015-11-22 DIAGNOSIS — I70208 Unspecified atherosclerosis of native arteries of extremities, other extremity: Secondary | ICD-10-CM | POA: Diagnosis not present

## 2015-11-22 DIAGNOSIS — Z1211 Encounter for screening for malignant neoplasm of colon: Secondary | ICD-10-CM | POA: Insufficient documentation

## 2015-11-22 DIAGNOSIS — D128 Benign neoplasm of rectum: Secondary | ICD-10-CM | POA: Insufficient documentation

## 2015-11-22 DIAGNOSIS — I2510 Atherosclerotic heart disease of native coronary artery without angina pectoris: Secondary | ICD-10-CM | POA: Insufficient documentation

## 2015-11-22 DIAGNOSIS — D124 Benign neoplasm of descending colon: Secondary | ICD-10-CM

## 2015-11-22 DIAGNOSIS — F17210 Nicotine dependence, cigarettes, uncomplicated: Secondary | ICD-10-CM | POA: Diagnosis not present

## 2015-11-22 DIAGNOSIS — Z7982 Long term (current) use of aspirin: Secondary | ICD-10-CM | POA: Insufficient documentation

## 2015-11-22 DIAGNOSIS — K219 Gastro-esophageal reflux disease without esophagitis: Secondary | ICD-10-CM | POA: Insufficient documentation

## 2015-11-22 DIAGNOSIS — E785 Hyperlipidemia, unspecified: Secondary | ICD-10-CM | POA: Diagnosis not present

## 2015-11-22 DIAGNOSIS — Z86010 Personal history of colonic polyps: Secondary | ICD-10-CM | POA: Insufficient documentation

## 2015-11-22 HISTORY — PX: COLONOSCOPY: SHX5424

## 2015-11-22 SURGERY — COLONOSCOPY
Anesthesia: Moderate Sedation

## 2015-11-22 MED ORDER — MIDAZOLAM HCL 5 MG/5ML IJ SOLN
INTRAMUSCULAR | Status: DC | PRN
  Administered 2015-11-22: 1 mg via INTRAVENOUS
  Administered 2015-11-22: 2 mg via INTRAVENOUS
  Administered 2015-11-22: 1 mg via INTRAVENOUS

## 2015-11-22 MED ORDER — SODIUM CHLORIDE 0.9 % IJ SOLN
INTRAMUSCULAR | Status: AC
  Filled 2015-11-22: qty 3

## 2015-11-22 MED ORDER — MEPERIDINE HCL 100 MG/ML IJ SOLN
INTRAMUSCULAR | Status: DC | PRN
  Administered 2015-11-22: 50 mg via INTRAVENOUS

## 2015-11-22 MED ORDER — PROMETHAZINE HCL 25 MG/ML IJ SOLN
INTRAMUSCULAR | Status: AC
  Filled 2015-11-22: qty 1

## 2015-11-22 MED ORDER — MEPERIDINE HCL 100 MG/ML IJ SOLN
INTRAMUSCULAR | Status: AC
  Filled 2015-11-22: qty 2

## 2015-11-22 MED ORDER — PROMETHAZINE HCL 25 MG/ML IJ SOLN
25.0000 mg | Freq: Once | INTRAMUSCULAR | Status: AC
  Administered 2015-11-22: 25 mg via INTRAVENOUS

## 2015-11-22 MED ORDER — STERILE WATER FOR IRRIGATION IR SOLN
Status: DC | PRN
  Administered 2015-11-22: 13:00:00

## 2015-11-22 MED ORDER — ONDANSETRON HCL 4 MG/2ML IJ SOLN
INTRAMUSCULAR | Status: DC | PRN
  Administered 2015-11-22: 4 mg via INTRAVENOUS

## 2015-11-22 MED ORDER — SODIUM CHLORIDE 0.9 % IV SOLN
INTRAVENOUS | Status: DC
  Administered 2015-11-22: 1000 mL via INTRAVENOUS

## 2015-11-22 MED ORDER — ONDANSETRON HCL 4 MG/2ML IJ SOLN
INTRAMUSCULAR | Status: AC
  Filled 2015-11-22: qty 2

## 2015-11-22 MED ORDER — MIDAZOLAM HCL 5 MG/5ML IJ SOLN
INTRAMUSCULAR | Status: AC
  Filled 2015-11-22: qty 10

## 2015-11-22 NOTE — Discharge Instructions (Signed)
Colonoscopy Discharge Instructions  Read the instructions outlined below and refer to this sheet in the next few weeks. These discharge instructions provide you with general information on caring for yourself after you leave the hospital. Your doctor may also give you specific instructions. While your treatment has been planned according to the most current medical practices available, unavoidable complications occasionally occur. If you have any problems or questions after discharge, call Dr. Gala Romney at [phone]. ACTIVITY  You may resume your regular activity, but move at a slower pace for the next 24 hours.   Take frequent rest periods for the next 24 hours.   Walking will help get rid of the air and reduce the bloated feeling in your belly (abdomen).   No driving for 24 hours (because of the medicine (anesthesia) used during the test).    Do not sign any important legal documents or operate any machinery for 24 hours (because of the anesthesia used during the test).  NUTRITION  Drink plenty of fluids.   You may resume your normal diet as instructed by your doctor.   Begin with a light meal and progress to your normal diet. Heavy or fried foods are harder to digest and may make you feel sick to your stomach (nauseated).   Avoid alcoholic beverages for 24 hours or as instructed.  MEDICATIONS  You may resume your normal medications unless your doctor tells you otherwise.  WHAT YOU CAN EXPECT TODAY  Some feelings of bloating in the abdomen.   Passage of more gas than usual.   Spotting of blood in your stool or on the toilet paper.  IF YOU HAD POLYPS REMOVED DURING THE COLONOSCOPY:  No aspirin products for 7 days or as instructed.   No alcohol for 7 days or as instructed.   Eat a soft diet for the next 24 hours.  FINDING OUT THE RESULTS OF YOUR TEST Not all test results are available during your visit. If your test results are not back during the visit, make an appointment  with your caregiver to find out the results. Do not assume everything is normal if you have not heard from your caregiver or the medical facility. It is important for you to follow up on all of your test results.  SEEK IMMEDIATE MEDICAL ATTENTION IF:  You have more than a spotting of blood in your stool.   Your belly is swollen (abdominal distention).   You are nauseated or vomiting.   You have a temperature over 101.   You have abdominal pain or discomfort that is severe or gets worse throughout the day.    Colon polyp information provided  Abdominal and pelvic CT with and without contrast along with a chest CT to further evaluate abdominal pain and history of pulmonary nodule (spoke with Candace with order information; she to schedule & call Mr Kentner.  L. Gwenlyn Perking, rn)  Further recommendations to follow pending review of pathology report  Colon Polyps Polyps are lumps of extra tissue growing inside the body. Polyps can grow in the large intestine (colon). Most colon polyps are noncancerous (benign). However, some colon polyps can become cancerous over time. Polyps that are larger than a pea may be harmful. To be safe, caregivers remove and test all polyps. CAUSES  Polyps form when mutations in the genes cause your cells to grow and divide even though no more tissue is needed. RISK FACTORS There are a number of risk factors that can increase your chances of getting colon  polyps. They include:  Being older than 50 years.  Family history of colon polyps or colon cancer.  Long-term colon diseases, such as colitis or Crohn disease.  Being overweight.  Smoking.  Being inactive.  Drinking too much alcohol. SYMPTOMS  Most small polyps do not cause symptoms. If symptoms are present, they may include:  Blood in the stool. The stool may look dark red or black.  Constipation or diarrhea that lasts longer than 1 week. DIAGNOSIS People often do not know they have polyps until their  caregiver finds them during a regular checkup. Your caregiver can use 4 tests to check for polyps:  Digital rectal exam. The caregiver wears gloves and feels inside the rectum. This test would find polyps only in the rectum.  Barium enema. The caregiver puts a liquid called barium into your rectum before taking X-rays of your colon. Barium makes your colon look white. Polyps are dark, so they are easy to see in the X-ray pictures.  Sigmoidoscopy. A thin, flexible tube (sigmoidoscope) is placed into your rectum. The sigmoidoscope has a light and tiny camera in it. The caregiver uses the sigmoidoscope to look at the last third of your colon.  Colonoscopy. This test is like sigmoidoscopy, but the caregiver looks at the entire colon. This is the most common method for finding and removing polyps. TREATMENT  Any polyps will be removed during a sigmoidoscopy or colonoscopy. The polyps are then tested for cancer. PREVENTION  To help lower your risk of getting more colon polyps:  Eat plenty of fruits and vegetables. Avoid eating fatty foods.  Do not smoke.  Avoid drinking alcohol.  Exercise every day.  Lose weight if recommended by your caregiver.  Eat plenty of calcium and folate. Foods that are rich in calcium include milk, cheese, and broccoli. Foods that are rich in folate include chickpeas, kidney beans, and spinach. HOME CARE INSTRUCTIONS Keep all follow-up appointments as directed by your caregiver. You may need periodic exams to check for polyps. SEEK MEDICAL CARE IF: You notice bleeding during a bowel movement.   This information is not intended to replace advice given to you by your health care provider. Make sure you discuss any questions you have with your health care provider.   Document Released: 09/06/2004 Document Revised: 01/01/2015 Document Reviewed: 02/20/2012 Elsevier Interactive Patient Education [employer].

## 2015-11-22 NOTE — H&P (View-Only) (Signed)
Primary Care Physician:  Purvis Kilts, MD  Primary Gastroenterologist:  Garfield Cornea, MD   Chief Complaint  Patient presents with  . set up TCS    HPI:  Jonathan [name] is a 64 y.o. male here to schedule a colonoscopy. He has chronic GERD and two year history of mid abdominal pain.   No prior TCS. Feels like tight belt around the waist for two years. Pressure into the groin. Pain was worse postoperatively after both his back surgeries. Notes he had trouble with urinary retention after anesthesia. Pain is not brought on by meals. Does not improved with bowel movement. Pain is constant. 2 out of 10 on a pain scale. Somewhat better if he walks. Worse of the past several months. Notes that he has difficulty emptying his bladder. Gets a good stream only if he takes his Flomax and has a couple of beers. Bowel movements are regular. No blood in the stool or melena. Denies weight change.  Reports remote upper endoscopy. Typical heartburn well-controlled on over-the-counter Nexium. He wonders if he should continue taking it chronically. Denies esophageal dysphagia, vomiting. Appetite is good.  Back surgery in March 2016. 4 days post surgery he had difficulty with urinary retention. CT of the abdomen and pelvis without contrast showed subpleural 5 mm nodule right middle lobe. Nonspecific bilateral perinephric stranding. Both ureters are decompressed. Moderate volume of colonic stool. Iliac stents seen bilaterally. Prostate appeared normal. MRI lumbar spine was also done postoperatively which showed partial decompressive laminectomies at L3 and L4. Slight worsened canal stenosis at L4-L5 with thecal sac measuring 5 mm in transverse diameter. Improved canal stenosis at L3-L4.  Current Outpatient Prescriptions  Medication Sig Dispense Refill  . aspirin EC 81 MG tablet Take 81 mg by mouth daily.    Marland Kitchen esomeprazole (NEXIUM) 20 MG capsule Take 20 mg by mouth daily at 12 noon.    . metoprolol (LOPRESSOR)  50 MG tablet Take 1 tablet (50 mg total) by mouth daily. 90 tablet 1  . Omega-3 Fatty Acids (FISH OIL) 1000 MG CAPS Take 1 capsule by mouth daily.     . ramipril (ALTACE) 10 MG capsule take 1 capsule by mouth once daily 90 capsule 3  . rosuvastatin (CRESTOR) 10 MG tablet Take 1 tablet (10 mg total) by mouth at bedtime. 90 tablet 3  . tamsulosin (FLOMAX) 0.4 MG CAPS capsule Take 0.4 mg by mouth daily.     No current facility-administered medications for this visit.    Allergies as of 11/15/2015  . (No Known Allergies)    Past Medical History  Diagnosis Date  . Hypertension   . Gastroesophageal reflux   . Prostate hyperplasia with urinary obstruction   . Myocardial infarction ([place]) 11/2001  . CAD (coronary artery disease)   . PAD (peripheral artery disease) (HCC)     bilateral iliac artery stents   . Tobacco consumption     quit cigarettes in 2008, smokes 5-6 cigars/week  . S/P CABG x 1 2009    R radial to PDA  . Hyperlipidemia   . H/O hiatal hernia   . PONV (postoperative nausea and vomiting)     most of the time he gets nauseated  . Anemia   . Femoral artery stenosis [facility])     Past Surgical History  Procedure Laterality Date  . Coronary artery bypass graft  2009    free right radial to PDA for progressive RCA disease & multiple re-stenosis (Dr. Ellison Hughs)  . Back  surgery    . Transthoracic echocardiogram  07/2013    EF 55-60%, mild LVH; mild MR; LA mildly dilated; RVSP increased; PA peak pressure 24mmHg (mod pulm HTN)  . Nm myocar perf wall motion  07/2013    bruce myoview - intermediate risk test with abnormal inferior horizontal ST depression; mild, fixed inferobasal defect, but no ischemia  . Iliac artery stent Bilateral 01/30/2002    9x39mm Cordis SmartStent to LCIA, 9x3.50mm Genesis stent to RCIA (Dr. Adora Fridge)  . Lower extremity arterial doppler  2009    R CIA stent - less than 50% diameter reduction; R CFA stent - 0-49% diameter reduction; R SFA less than 50% diameter  reduction; L CFA, profunda, SFA - equal/less than 50% diameter reduction  . Anterior cervical decomp/discectomy fusion N/A 10/22/2014    Procedure: CERVICAL SIX SEVEN ANTERIOR CERVICAL DECOMPRESSION/DISCECTOMY FUSION  ;  Surgeon: Ashok Pall, MD;  Location: [place] [facility] OR[facility];  Service: Neurosurgery;  Laterality: N/A;  C67 Possible C7T1 anterior cervical decompression with fusion plating and bonegraft  . Cardiac catheterization  12/03/2001    2.5x56mm S-660 stent to RCA (MI)  . Cardiac catheterization  05/06/2002    PTCA & stenting of LAD and PTCA & stenting of mid/distal RCA with 3.0x53mm Zeta stent  . Cardiac catheterization  06/03/2002    LAD with prox 30% in-stent restenosis; ramus with 95% ostial lesion; L Cfx & 3-OMs free of disease; mild 30% RCA narrowing & 20-30% in-stent restenosis in RCA (Dr. Gerrie Nordmann)  . Cardiac catheterization  03/24/2003    overlapping tandem DES 3.0x23 Cypher stent to mid RCA for in-stent restenosis & new lesion beyond previous stent with 3.0x12 SciMed TAXUS (Dr. Marella Chimes)  . Cardiac catheterization  12/08/2003    3.0x24 TAXUS overlapping 3.0x70mm TAXUS DES in RCA and cuttin balloon atherectomy (Dr. Marella Chimes)  . Cardiac catheterization  07/10/2007    normal L main, L Cfx & 3-OMs free of disease; LAD with Zeta stent in prox region (patent); RCA with multiple stents (3.0x12 Taxus in ostium with 3.0x20 Taxus overlapping it, and series of 3.0x24 Tacus and Cypher stent overlapping this, and in distal RC 2x5x90mm Zeta(?) stent; ostium of RCA with 80% narrowing - 3x15 cutting balloon - 80% to patient w/NO obstruction (Dr. Loni Muse. Little)  . Cardiac catheterization  06/08/2008    high-grade osital & prox in-stent RCA restenosis - Dr. Marella Chimes  . Lumbar laminectomy/decompression microdiscectomy Bilateral 03/18/2015    Procedure: LUMBAR LAMINECTOMY/DECOMPRESSION MICRODISCECTOMY 2 LEVELS;  Surgeon: Ashok Pall, MD;  Location: [place] [facility] ORS;  Service: Neurosurgery;  Laterality:  Bilateral;  Bilateral L34 L45 laminectomies    Family History  Problem Relation Age of Onset  . Heart disease Father   . CAD Brother     CABG  . CAD Brother     CABG  . Colon cancer Neg Hx     Social History   Social History  . Marital Status: Married    Spouse Name: N/A  . Number of Children: 2  . Years of Education: N/A   Occupational History  . disability    Social History Main Topics  . Smoking status: Current Some Day Smoker -- 1.00 packs/day for 30 years    Types: Cigarettes    Last Attempt to Quit: 10/05/2014  . Smokeless tobacco: Never Used     Comment: 1 pk per week - cigars  . Alcohol Use: 7.2 oz/week    12 Cans of beer per week  Comment: beer 2-3 times per week  . Drug Use: No  . Sexual Activity: Not on file   Other Topics Concern  . Not on file   Social History Narrative      ROS:  General: Negative for anorexia, weight loss, fever, chills, fatigue, weakness. Eyes: Negative for vision changes.  ENT: Negative for hoarseness, difficulty swallowing , nasal congestion. CV: Negative for chest pain, angina, palpitations, dyspnea on exertion, peripheral edema.  Respiratory: Negative for dyspnea at rest, dyspnea on exertion, cough, sputum, wheezing.  GI: See history of present illness. GU:  Negative for dysuria, hematuria, urinary incontinence. Positive urinary frequency, positive nocturnal urination.  MS: Negative for joint pain, low back pain.  Derm: Negative for rash or itching.  Neuro: Negative for weakness, abnormal sensation, seizure, frequent headaches, memory loss, confusion.  Psych: Negative for anxiety, depression, suicidal ideation, hallucinations.  Endo: Negative for unusual weight change.  Heme: Negative for bruising or bleeding. Allergy: Negative for rash or hives.    Physical Examination:  BP 137/68 mmHg  Pulse 81  Temp(Src) 97.4 F (36.3 C)  Ht 5\' 8"  (1.727 m)  Wt 178 lb 3.2 oz (80.831 kg)  BMI 27.10 kg/m2   General:  Well-nourished, well-developed in no acute distress.  Head: Normocephalic, atraumatic.   Eyes: Conjunctiva pink, no icterus. Mouth: Oropharyngeal mucosa moist and pink , no lesions erythema or exudate. Neck: Supple without thyromegaly, masses, or lymphadenopathy.  Lungs: Clear to auscultation bilaterally.  Heart: Regular rate and rhythm, no murmurs rubs or gallops.  Abdomen: Bowel sounds are normal, mild suprapubic tenderness, nondistended, no hepatosplenomegaly or masses, no abdominal bruits or    hernia , no rebound or guarding.   Rectal: Deferred Extremities: No lower extremity edema. No clubbing or deformities.  Neuro: Alert and oriented x 4 , grossly normal neurologically.  Skin: Warm and dry, no rash or jaundice.   Psych: Alert and cooperative, normal mood and affect.  Labs: Postop labs Lab Results  Component Value Date   WBC 8.1 03/21/2015   HGB 11.4* 03/21/2015   HCT 33.7* 03/21/2015   MCV 84.5 03/21/2015   PLT 171 03/21/2015   Lab Results  Component Value Date   CREATININE 0.96 03/21/2015   BUN 12 03/21/2015   NA 137 03/21/2015   K 3.9 03/21/2015   CL 104 03/21/2015   CO2 23 03/21/2015   Lab Results  Component Value Date   ALT 31 03/21/2015   AST 34 03/21/2015   ALKPHOS 105 03/21/2015   BILITOT 1.1 03/21/2015     Imaging Studies: No results found.

## 2015-11-22 NOTE — Interval H&P Note (Signed)
History and Physical Interval Note:  11/22/2015 1:08 PM  Jonathan [name]  has presented today for surgery, with the diagnosis of SCREENING  The various methods of treatment have been discussed with the patient and family. After consideration of risks, benefits and other options for treatment, the patient has consented to  Procedure(s) with comments: COLONOSCOPY (N/A) - 100 as a surgical intervention .  The patient's history has been reviewed, patient examined, no change in status, stable for surgery.  I have reviewed the patient's chart and labs.  Questions were answered to the patient's satisfaction.     Carols Clemence   No change. Screening colonoscopy per plan. The risks, benefits, limitations, alternatives and imponderables have been reviewed with the patient. Questions have been answered. All parties are agreeable.

## 2015-11-22 NOTE — Op Note (Signed)
[facility] [address] [place], [address]   COLONOSCOPY PROCEDURE REPORT  PATIENT: Jonathan [name], Jonathan [name]  MR#: [id number] BIRTHDATE: [birth year] , 28  yrs. old GENDER: male ENDOSCOPIST: R.  Garfield Cornea, MD FACP [facility] REFERRED [id number] Hilma Favors, M.D. PROCEDURE DATE:  12/05/15 PROCEDURE:   Colonoscopy with biopsy and snare polypectomy INDICATIONS:First ever average risk colorectal cancer screening examination. MEDICATIONS: Versed 4 mg IV and Demerol 50 mg IV in divided doses. Phenergan 25 mg IV.  Zofran 4 mg IV. ASA CLASS:       Class II  CONSENT: The risks, benefits, alternatives and imponderables including but not limited to bleeding, perforation as well as the possibility of a missed lesion have been reviewed.  The potential for biopsy, lesion removal, etc. have also been discussed. Questions have been answered.  All parties agreeable.  Please see the history and physical in the medical record for more information.  DESCRIPTION OF PROCEDURE:   After the risks benefits and alternatives of the procedure were thoroughly explained, informed consent was obtained.  The digital rectal exam revealed no abnormalities of the rectum.   The [id number]  endoscope was introduced through the anus and advanced to the cecum, which was identified by both the appendix and ileocecal valve. No adverse events experienced.   The quality of the prep was adequate  The instrument was then slowly withdrawn as the colon was fully examined. Estimated blood loss is zero unless otherwise noted in this procedure report.      COLON FINDINGS: Couple of distal diminutive rectal polyps; otherwise, the remainder of the rectal mucosa appeared normal.  (1) 5 mm polyp in the mid descending segment; otherwise, the remainder of the colonic mucosa appeared normal.  The above-mentioned polyps were cold biopsy and cold snare removed, respectively.  Retroflexion was performed.  .  Withdrawal time=13 minutes 0 seconds.  The scope was withdrawn and the procedure completed. COMPLICATIONS: There were no immediate complications. EBL 3 mL ENDOSCOPIC IMPRESSION: Rectal and colonic polyps removed as described above. No explanation for lower abdominal discomfort. History of pulmonary nodule.  RECOMMENDATIONS: Follow-up on pathology. Proceed with abdominal and pelvic CT scan with IV and oral contrast. Also, will do a chest CT at the same time to follow-up on pulmonary nodule as well as to further evaluate abdominal pain. Given history of urinary retention symptoms, he may benefit ultimately from seeing the urologist.  eSigned:  R. Garfield Cornea, MD [facility] 2015-12-05 1:39 PM   cc:  CPT CODES: ICD CODES:  The ICD and CPT codes recommended by this software are interpretations from the data that the clinical staff has captured with the software.  The verification of the translation of this report to the ICD and CPT codes and modifiers is the sole responsibility of the health care institution and practicing physician where this report was generated.  [employer]. will not be held responsible for the validity of the ICD and CPT codes included on this report.  AMA assumes no liability for data contained or not contained herein. CPT is a Designer, television/film set of the [employer].

## 2015-11-23 ENCOUNTER — Other Ambulatory Visit: Payer: Self-pay

## 2015-11-23 ENCOUNTER — Telehealth: Payer: Self-pay

## 2015-11-23 DIAGNOSIS — R911 Solitary pulmonary nodule: Secondary | ICD-10-CM

## 2015-11-23 DIAGNOSIS — R109 Unspecified abdominal pain: Secondary | ICD-10-CM

## 2015-11-23 NOTE — Telephone Encounter (Signed)
Called pt and made pt him aware of his appointments for this CT chest and CT abd/pelvis.  He is aware to go pick up contrast.  Per [facility] PA # for CT chest is [id number]  PA # for CT abd/pelvis is  [id number]

## 2015-11-24 ENCOUNTER — Encounter: Payer: Self-pay | Admitting: Internal Medicine

## 2015-11-25 ENCOUNTER — Encounter (HOSPITAL_COMMUNITY): Payer: Self-pay | Admitting: Internal Medicine

## 2015-11-26 ENCOUNTER — Ambulatory Visit (HOSPITAL_COMMUNITY)
Admission: RE | Admit: 2015-11-26 | Discharge: 2015-11-26 | Disposition: A | Discharge status: Home / Self Care | Payer: Medicare Other | Source: Ambulatory Visit | Attending: Internal Medicine | Admitting: Internal Medicine

## 2015-11-26 DIAGNOSIS — R911 Solitary pulmonary nodule: Secondary | ICD-10-CM | POA: Diagnosis not present

## 2015-11-26 DIAGNOSIS — R109 Unspecified abdominal pain: Secondary | ICD-10-CM | POA: Insufficient documentation

## 2015-11-26 DIAGNOSIS — R9349 Abnormal radiologic findings on diagnostic imaging of other urinary organs: Secondary | ICD-10-CM | POA: Diagnosis not present

## 2015-11-26 LAB — POCT I-STAT CREATININE: CREATININE: 0.9 mg/dL (ref 0.61–1.24)

## 2015-11-26 MED ORDER — IOHEXOL 300 MG/ML  SOLN
100.0000 mL | Freq: Once | INTRAMUSCULAR | Status: AC | PRN
  Administered 2015-11-26: 100 mL via INTRAVENOUS

## 2015-11-26 MED ORDER — SODIUM CHLORIDE 0.9 % IJ SOLN
INTRAMUSCULAR | Status: AC
  Filled 2015-11-26: qty 30

## 2015-12-02 ENCOUNTER — Telehealth: Payer: Self-pay | Admitting: Gastroenterology

## 2015-12-02 NOTE — Telephone Encounter (Signed)
Please let patient know that we reviewed his old EGD report and path by Dr. Laural Golden in 2003.  He had a small patch of Barrett's esophagus. He should have had follow EGD, he is a increased risk of esophageal cancer although esophageal cancer is rare.   At any time in the near future, he should consider follow up ov to schedule EGD for h/o Barrett's. Please offer.

## 2015-12-07 NOTE — Telephone Encounter (Signed)
Tried to call- NA 

## 2015-12-08 NOTE — Telephone Encounter (Signed)
Patient called back and I made him aware of LL's recommendations listed below, however he was not interested in scheduling an EGD right now.  He stated he will "think about it and call us back".  Almyra Free, do you think we should mail him a letter about this?

## 2015-12-08 NOTE — Telephone Encounter (Signed)
Tried to call pt- NA 

## 2015-12-09 ENCOUNTER — Telehealth: Payer: Self-pay | Admitting: Internal Medicine

## 2015-12-09 NOTE — Telephone Encounter (Signed)
PATIENT CALLED WITH A QUESTION REGARDING WHAT HE WAS TOLD ABOUT HIS GALLBLADDER AFTER HIS PROCEDURE  PLEASE ADVISE

## 2015-12-09 NOTE — Telephone Encounter (Signed)
Letter and barretts information mailed to the pt.

## 2015-12-10 NOTE — Telephone Encounter (Signed)
Tried to call- NA 

## 2015-12-14 NOTE — Telephone Encounter (Signed)
Spoke with the pt again and explained his ct results.

## 2015-12-26 ENCOUNTER — Other Ambulatory Visit: Payer: Self-pay | Admitting: Internal Medicine

## 2016-01-20 ENCOUNTER — Other Ambulatory Visit: Payer: Self-pay | Admitting: Internal Medicine

## 2016-01-20 MED ORDER — RAMIPRIL 10 MG PO CAPS: 10.0000 mg | ORAL_CAPSULE | Freq: Every day | ORAL | Status: DC

## 2016-01-21 ENCOUNTER — Encounter: Payer: Self-pay | Admitting: Internal Medicine

## 2016-01-21 ENCOUNTER — Ambulatory Visit (INDEPENDENT_AMBULATORY_CARE_PROVIDER_SITE_OTHER): Payer: 59 | Admitting: Internal Medicine

## 2016-01-21 VITALS — BP 148/76 | HR 70 | Ht 68.0 in | Wt 180.6 lb

## 2016-01-21 DIAGNOSIS — I739 Peripheral vascular disease, unspecified: Secondary | ICD-10-CM | POA: Diagnosis not present

## 2016-01-21 DIAGNOSIS — N400 Enlarged prostate without lower urinary tract symptoms: Secondary | ICD-10-CM | POA: Diagnosis not present

## 2016-01-21 DIAGNOSIS — I10 Essential (primary) hypertension: Secondary | ICD-10-CM

## 2016-01-21 DIAGNOSIS — Z951 Presence of aortocoronary bypass graft: Secondary | ICD-10-CM

## 2016-01-21 NOTE — Patient Instructions (Signed)
Dr Debara Pickett has made no changes to your current medications or treatment plan.  Dr Debara Pickett recommends that you schedule a follow-up appointment in 1 year. You will receive a reminder letter in the mail two months in advance. If you don't receive a letter, please call our office to schedule the follow-up appointment.  If you need a refill on your cardiac medications before your next appointment, please call your pharmacy.

## 2016-01-23 NOTE — Progress Notes (Signed)
OFFICE NOTE  Chief Complaint:  No complaints  Primary Care Physician: Purvis Kilts, MD  HPI:  Jonathan [name] is a pleasant 65 year old male patient who was formerly followed by Dr. Rollene Fare. He has an extensive past medical history of coronary disease. He has had numerous prior stents to the right coronary artery and LAD and underwent brachii therapy to the LAD. Eventually developed occlusion of the right coronary artery and ultimately underwent a right free radial graft to the PDA by Dr. Cyndia Bent in 2009. He also has a history of peripheral arterial disease and underwent right common iliac and femoral artery stenting in 2003, but does have bilateral disease which has not been studied since 2009. In addition he has hypertension, GERD and is here today for preoperative cardiac evaluation. He's been having numbness and tingling in his hands as well as legs and is felt to have a cord problem in the neck. His neurosurgeon is Dr. Cyndy Freeze and was planning on operating yesterday however he was noted to have an abnormal EKG and was sent for preoperative evaluation. It should be noted he had a negative nuclear stress test for ischemia in 07/2013. Currently denies any chest pain. He is able to exert himself even walking up stairs without shortness of breath but is having problems with numbness and tingling in his legs as well as cool extremities.  Jonathan [name] returns today for follow-up. He underwent lumbar surgery by Dr. Cyndy Freeze and he reports he still has problems with leg pain and cramping. He denies any chest pain or worsening shortness of breath. Blood pressure is well-controlled today.  PMHx:  Past Medical History  Diagnosis Date  . Hypertension   . Gastroesophageal reflux   . Prostate hyperplasia with urinary obstruction   . Myocardial infarction ([place]) 11/2001  . CAD (coronary artery disease)   . PAD (peripheral artery disease) (HCC)     bilateral iliac artery stents   . Tobacco  consumption     quit cigarettes in 2008, smokes 5-6 cigars/week  . S/P CABG x 1 2009    R radial to PDA  . Hyperlipidemia   . H/O hiatal hernia   . PONV (postoperative nausea and vomiting)     most of the time he gets nauseated  . Anemia   . Femoral artery stenosis [facility])     Past Surgical History  Procedure Laterality Date  . Coronary artery bypass graft  2009    free right radial to PDA for progressive RCA disease & multiple re-stenosis (Dr. Ellison Hughs)  . Back surgery    . Transthoracic echocardiogram  07/2013    EF 55-60%, mild LVH; mild MR; LA mildly dilated; RVSP increased; PA peak pressure 33mmHg (mod pulm HTN)  . Nm myocar perf wall motion  07/2013    bruce myoview - intermediate risk test with abnormal inferior horizontal ST depression; mild, fixed inferobasal defect, but no ischemia  . Iliac artery stent Bilateral 01/30/2002    9x94mm Cordis SmartStent to LCIA, 9x3.14mm Genesis stent to RCIA (Dr. Adora Fridge)  . Lower extremity arterial doppler  2009    R CIA stent - less than 50% diameter reduction; R CFA stent - 0-49% diameter reduction; R SFA less than 50% diameter reduction; L CFA, profunda, SFA - equal/less than 50% diameter reduction  . Anterior cervical decomp/discectomy fusion N/A 10/22/2014    Procedure: CERVICAL SIX SEVEN ANTERIOR CERVICAL DECOMPRESSION/DISCECTOMY FUSION  ;  Surgeon: Ashok Pall, MD;  Location: [place] [facility];  Service: Neurosurgery;  Laterality: N/A;  C67 Possible C7T1 anterior cervical decompression with fusion plating and bonegraft  . Cardiac catheterization  12/03/2001    2.5x39mm S-660 stent to RCA (MI)  . Cardiac catheterization  05/06/2002    PTCA & stenting of LAD and PTCA & stenting of mid/distal RCA with 3.0x52mm Zeta stent  . Cardiac catheterization  06/03/2002    LAD with prox 30% in-stent restenosis; ramus with 95% ostial lesion; L Cfx & 3-OMs free of disease; mild 30% RCA narrowing & 20-30% in-stent restenosis in RCA (Dr. Gerrie Nordmann)  . Cardiac  catheterization  03/24/2003    overlapping tandem DES 3.0x23 Cypher stent to mid RCA for in-stent restenosis & new lesion beyond previous stent with 3.0x12 SciMed TAXUS (Dr. Marella Chimes)  . Cardiac catheterization  12/08/2003    3.0x24 TAXUS overlapping 3.0x37mm TAXUS DES in RCA and cuttin balloon atherectomy (Dr. Marella Chimes)  . Cardiac catheterization  07/10/2007    normal L main, L Cfx & 3-OMs free of disease; LAD with Zeta stent in prox region (patent); RCA with multiple stents (3.0x12 Taxus in ostium with 3.0x20 Taxus overlapping it, and series of 3.0x24 Tacus and Cypher stent overlapping this, and in distal RC 2x5x46mm Zeta(?) stent; ostium of RCA with 80% narrowing - 3x15 cutting balloon - 80% to patient w/NO obstruction (Dr. Loni Muse. Little)  . Cardiac catheterization  06/08/2008    high-grade osital & prox in-stent RCA restenosis - Dr. Marella Chimes  . Lumbar laminectomy/decompression microdiscectomy Bilateral 03/18/2015    Procedure: LUMBAR LAMINECTOMY/DECOMPRESSION MICRODISCECTOMY 2 LEVELS;  Surgeon: Ashok Pall, MD;  Location: [place] NE[facility]O ORS;  Service: Neurosurgery;  Laterality: Bilateral;  Bilateral L34 L45 laminectomies  . Colonoscopy N/A 11/22/2015    Procedure: COLONOSCOPY;  Surgeon: Daneil Dolin, MD;  [facility]: [facility];  Service: Endoscopy;  Laterality: N/A;  100    FAMHx:  Family History  Problem Relation Age of Onset  . Heart disease Father   . CAD Brother     CABG  . CAD Brother     CABG  . Colon cancer Neg Hx     SOCHx:   reports that he has been smoking Cigarettes.  He has a 6 pack-year smoking history. He has never used smokeless tobacco. He reports that he drinks about 7.2 oz of alcohol per week. He reports that he does not use illicit drugs.  ALLERGIES:  No Known Allergies  ROS: A comprehensive review of systems was negative except for: Musculoskeletal: positive for muscle weakness, myalgias and cool extremities Neurological: positive for paresthesia  HOME  MEDS: Current Outpatient Prescriptions  Medication Sig Dispense Refill  . aspirin EC 81 MG tablet Take 81 mg by mouth daily.    Marland Kitchen esomeprazole (NEXIUM) 20 MG capsule Take 20 mg by mouth daily at 12 noon.    . metoprolol (LOPRESSOR) 50 MG tablet Take 1 tablet (50 mg total) by mouth daily. 90 tablet 1  . Multiple Vitamin (MULTIVITAMIN) capsule Take 1 capsule by mouth daily.    . Omega-3 Fatty Acids (FISH OIL) 1000 MG CAPS Take 1 capsule by mouth daily.     . ramipril (ALTACE) 10 MG capsule Take 1 capsule (10 mg total) by mouth daily. 90 capsule 0  . rosuvastatin (CRESTOR) 10 MG tablet Take 1 tablet (10 mg total) by mouth at bedtime. 90 tablet 3  . tamsulosin (FLOMAX) 0.4 MG CAPS capsule Take 0.4 mg by mouth daily.     No current facility-administered medications for this  visit.    LABS/IMAGING: No results found for this or any previous visit (from the past 48 hour(s)). No results found.  VITALS: BP 148/76 mmHg  Pulse 70  Ht 5\' 8"  (1.727 m)  Wt 180 lb 9 oz (81.903 kg)  BMI 27.46 kg/m2  EXAM: General appearance: alert and no distress Neck: no carotid bruit and no JVD Lungs: clear to auscultation bilaterally Heart: regular rate and rhythm, S1, S2 normal, no murmur, click, rub or gallop Abdomen: soft, non-tender; bowel sounds normal; no masses,  no organomegaly Extremities: cool to touch, 1+ pulses, no edema Pulses: 1+ Skin: pale, cool, dry Neurologic: Mental status: Alert, oriented, thought content appropriate Psych: Normal  EKG: Normal sinus rhythm at 70  ASSESSMENT: 1. Coronary artery disease status post single vessel CABG in 2009 (free right radial to PDA) 2. Numerous prior coronary interventions to the RCA and LAD with brachii therapy 3. PAD status post right common iliac and right superficial femoral artery stents in 2003 4. Hypertension 5. Low to intermediate risk for upcoming surgery  PLAN: 1.   Mr. Duchow Successfully underwent back surgery without complications  however feels like it has not helped him. His blood pressure appears to be well-controlled. He denies any chest pain or signs of coronary ischemia. He's currently on adequate medications including aspirin, Lopressor, Altace and Crestor. He also started taking multivitamin recently. Cholesterol is followed by his primary care provider. I'll request those records to review. Otherwise plan to see him back annually or sooner as necessary.  Pixie Casino, MD, [facility] Attending Cardiologist [place] C Lacresia Darwish 01/23/2016, 4:15 PM

## 2016-03-07 ENCOUNTER — Other Ambulatory Visit: Payer: Self-pay | Admitting: Internal Medicine

## 2016-03-08 NOTE — Telephone Encounter (Signed)
REFILL 

## 2016-04-14 ENCOUNTER — Other Ambulatory Visit: Payer: Self-pay | Admitting: Internal Medicine

## 2016-04-14 NOTE — Telephone Encounter (Signed)
Rx(s) sent to pharmacy electronically.  

## 2016-11-06 ENCOUNTER — Telehealth: Payer: Self-pay | Admitting: Internal Medicine

## 2016-11-06 NOTE — Telephone Encounter (Signed)
Letter mailed

## 2016-11-06 NOTE — Telephone Encounter (Signed)
Recall for ct chest °

## 2016-11-21 ENCOUNTER — Other Ambulatory Visit: Payer: Self-pay | Admitting: *Deleted

## 2016-11-21 DIAGNOSIS — Z79899 Other long term (current) drug therapy: Secondary | ICD-10-CM

## 2016-11-21 DIAGNOSIS — E7849 Other hyperlipidemia: Secondary | ICD-10-CM

## 2016-11-21 LAB — HEPATIC FUNCTION PANEL
ALBUMIN: 4 g/dL (ref 3.6–5.1)
ALT: 22 U/L (ref 9–46)
AST: 17 U/L (ref 10–35)
Alkaline Phosphatase: 72 U/L (ref 40–115)
BILIRUBIN DIRECT: 0.1 mg/dL (ref <=0.2)
BILIRUBIN TOTAL: 0.7 mg/dL (ref 0.2–1.2)
Indirect Bilirubin: 0.6 mg/dL (ref 0.2–1.2)
Total Protein: 6.6 g/dL (ref 6.1–8.1)

## 2016-11-21 LAB — LIPID PANEL
CHOL/HDL RATIO: 4.9 ratio (ref <5.0)
Cholesterol: 148 mg/dL (ref <200)
HDL: 30 mg/dL — AB (ref >40)
LDL CALC: 57 mg/dL (ref <100)
Triglycerides: 306 mg/dL — ABNORMAL HIGH (ref <150)
VLDL: 61 mg/dL — ABNORMAL HIGH (ref <30)

## 2016-11-21 NOTE — Progress Notes (Unsigned)
Patient presented to [facility] for fasting labwork. No recent encounter notes - does have upcoming scheduled appt. Lipid and hep fxn ordered, patient and lab aware.

## 2016-11-23 ENCOUNTER — Ambulatory Visit (INDEPENDENT_AMBULATORY_CARE_PROVIDER_SITE_OTHER): Payer: Medicare Other | Admitting: Internal Medicine

## 2016-11-23 ENCOUNTER — Encounter: Payer: Self-pay | Admitting: Internal Medicine

## 2016-11-23 VITALS — BP 142/52 | HR 75 | Ht 68.0 in | Wt 179.2 lb

## 2016-11-23 DIAGNOSIS — E782 Mixed hyperlipidemia: Secondary | ICD-10-CM | POA: Diagnosis not present

## 2016-11-23 DIAGNOSIS — Z951 Presence of aortocoronary bypass graft: Secondary | ICD-10-CM

## 2016-11-23 DIAGNOSIS — I2510 Atherosclerotic heart disease of native coronary artery without angina pectoris: Secondary | ICD-10-CM | POA: Diagnosis not present

## 2016-11-23 DIAGNOSIS — I739 Peripheral vascular disease, unspecified: Secondary | ICD-10-CM | POA: Diagnosis not present

## 2016-11-23 MED ORDER — ROSUVASTATIN CALCIUM 5 MG PO TABS: 5.0000 mg | ORAL_TABLET | Freq: Every day | ORAL | 3 refills | Status: DC

## 2016-11-23 MED ORDER — EZETIMIBE 10 MG PO TABS: 10.0000 mg | ORAL_TABLET | Freq: Every day | ORAL | 3 refills | Status: DC

## 2016-11-23 NOTE — Patient Instructions (Signed)
Medication Instructions:   DECREASE CRESTOR TO 5 MG ONCE DAILY- 1/2 OF THE 10 MG TABLET ONCE DAILY  START ZETIA 10 MG ONCE DAILY  Labwork:  Your physician recommends that you return for lab work in: 3 MONTHS  Follow-Up:  Your physician wants you to follow-up in: [place] will receive a reminder letter in the mail two months in advance. If you don't receive a letter, please call our office to schedule the follow-up appointment.   If you need a refill on your cardiac medications before your next appointment, please call your pharmacy.

## 2016-11-24 DIAGNOSIS — E782 Mixed hyperlipidemia: Secondary | ICD-10-CM | POA: Insufficient documentation

## 2016-11-24 NOTE — Progress Notes (Signed)
OFFICE NOTE  Chief Complaint:  No complaints  Primary Care Physician: Purvis Kilts, MD  HPI:  Jonathan [name] is a pleasant 65 year old male patient who was formerly followed by Dr. Rollene Fare. He has an extensive past medical history of coronary disease. He has had numerous prior stents to the right coronary artery and LAD and underwent brachii therapy to the LAD. Eventually developed occlusion of the right coronary artery and ultimately underwent a right free radial graft to the PDA by Dr. Cyndia Bent in 2009. He also has a history of peripheral arterial disease and underwent right common iliac and femoral artery stenting in 2003, but does have bilateral disease which has not been studied since 2009. In addition he has hypertension, GERD and is here today for preoperative cardiac evaluation. He's been having numbness and tingling in his hands as well as legs and is felt to have a cord problem in the neck. His neurosurgeon is Dr. Cyndy Freeze and was planning on operating yesterday however he was noted to have an abnormal EKG and was sent for preoperative evaluation. It should be noted he had a negative nuclear stress test for ischemia in 07/2013. Currently denies any chest pain. He is able to exert himself even walking up stairs without shortness of breath but is having problems with numbness and tingling in his legs as well as cool extremities.  Mr. Gelwicks returns today for follow-up. He underwent lumbar surgery by Dr. Cyndy Freeze and he reports he still has problems with leg pain and cramping. He denies any chest pain or worsening shortness of breath. Blood pressure is well-controlled today.  11/23/2016  Mr. Waugh returns today for follow-up. He is generally without complaints. He reports that he is taking 10 mg of Crestor every other day due to some leg pains. He says this is improved his symptoms somewhat. Cholesterol however is not at goal. EKG shows normal sinus rhythm. He has no chest pain or  shortness of breath. He continues to say that he's had no significant improvement in his back pain with back surgery.  PMHx:  Past Medical History:  Diagnosis Date  . Anemia   . CAD (coronary artery disease)   . Femoral artery stenosis ([place])   . Gastroesophageal reflux   . H/O hiatal hernia   . Hyperlipidemia   . Hypertension   . Myocardial infarction 11/2001  . PAD (peripheral artery disease) (HCC)    bilateral iliac artery stents   . PONV (postoperative nausea and vomiting)    most of the time he gets nauseated  . Prostate hyperplasia with urinary obstruction   . S/P CABG x 1 2009   R radial to PDA  . Tobacco consumption    quit cigarettes in 2008, smokes 5-6 cigars/week    Past Surgical History:  Procedure Laterality Date  . ANTERIOR CERVICAL DECOMP/DISCECTOMY FUSION N/A 10/22/2014   Procedure: CERVICAL SIX SEVEN ANTERIOR CERVICAL DECOMPRESSION/DISCECTOMY FUSION  ;  Surgeon: Ashok Pall, MD;  Location: [place] [facility];  Service: Neurosurgery;  Laterality: N/A;  C67 Possible C7T1 anterior cervical decompression with fusion plating and bonegraft  . BACK SURGERY    . CARDIAC CATHETERIZATION  12/03/2001   2.5x36mm S-660 stent to RCA (MI)  . CARDIAC CATHETERIZATION  05/06/2002   PTCA & stenting of LAD and PTCA & stenting of mid/distal RCA with 3.0x35mm Zeta stent  . CARDIAC CATHETERIZATION  06/03/2002   LAD with prox 30% in-stent restenosis; ramus with 95% ostial lesion; L Cfx & 3-OMs free  of disease; mild 30% RCA narrowing & 20-30% in-stent restenosis in RCA (Dr. Gerrie Nordmann)  . CARDIAC CATHETERIZATION  03/24/2003   overlapping tandem DES 3.0x23 Cypher stent to mid RCA for in-stent restenosis & new lesion beyond previous stent with 3.0x12 SciMed TAXUS (Dr. Marella Chimes)  . CARDIAC CATHETERIZATION  12/08/2003   3.0x24 TAXUS overlapping 3.0x61mm TAXUS DES in RCA and cuttin balloon atherectomy (Dr. Marella Chimes)  . CARDIAC CATHETERIZATION  07/10/2007   normal L main, L Cfx & 3-OMs free  of disease; LAD with Zeta stent in prox region (patent); RCA with multiple stents (3.0x12 Taxus in ostium with 3.0x20 Taxus overlapping it, and series of 3.0x24 Tacus and Cypher stent overlapping this, and in distal RC 2x5x73mm Zeta(?) stent; ostium of RCA with 80% narrowing - 3x15 cutting balloon - 80% to patient w/NO obstruction (Dr. Loni Muse. Little)  . CARDIAC CATHETERIZATION  06/08/2008   high-grade osital & prox in-stent RCA restenosis - Dr. Marella Chimes  . COLONOSCOPY N/A 11/22/2015   Procedure: COLONOSCOPY;  Surgeon: Daneil Dolin, MD;  [facility];  Service: Endoscopy;  Laterality: N/A;  100  . CORONARY ARTERY BYPASS GRAFT  2009   free right radial to PDA for progressive RCA disease & multiple re-stenosis (Dr. Ellison Hughs)  . ILIAC ARTERY STENT Bilateral 01/30/2002   9x24mm Cordis SmartStent to LCIA, 9x3.63mm Genesis stent to RCIA (Dr. Adora Fridge)  . Lower Extremity Arterial Doppler  2009   R CIA stent - less than 50% diameter reduction; R CFA stent - 0-49% diameter reduction; R SFA less than 50% diameter reduction; L CFA, profunda, SFA - equal/less than 50% diameter reduction  . LUMBAR LAMINECTOMY/DECOMPRESSION MICRODISCECTOMY Bilateral 03/18/2015   Procedure: LUMBAR LAMINECTOMY/DECOMPRESSION MICRODISCECTOMY 2 LEVELS;  Surgeon: Ashok Pall, MD;  Location: [place] [facility];  Service: Neurosurgery;  Laterality: Bilateral;  Bilateral L34 L45 laminectomies  . NM MYOCAR PERF WALL MOTION  07/2013   bruce myoview - intermediate risk test with abnormal inferior horizontal ST depression; mild, fixed inferobasal defect, but no ischemia  . TRANSTHORACIC ECHOCARDIOGRAM  07/2013   EF 55-60%, mild LVH; mild MR; LA mildly dilated; RVSP increased; PA peak pressure 84mmHg (mod pulm HTN)    FAMHx:  Family History  Problem Relation Age of Onset  . Heart disease Father   . CAD Brother     CABG  . CAD Brother     CABG  . Colon cancer Neg Hx     SOCHx:   reports that he has been smoking Cigarettes.  He  has a 6.00 pack-year smoking history. He has never used smokeless tobacco. He reports that he drinks about 7.2 oz of alcohol per week . He reports that he does not use drugs.  ALLERGIES:  No Known Allergies  ROS: Pertinent items noted in HPI and remainder of comprehensive ROS otherwise negative.  HOME MEDS: Current Outpatient Prescriptions  Medication Sig Dispense Refill  . aspirin EC 81 MG tablet Take 81 mg by mouth daily.    Marland Kitchen esomeprazole (NEXIUM) 20 MG capsule Take 20 mg by mouth daily at 12 noon.    . metoprolol (LOPRESSOR) 50 MG tablet take 1 tablet by mouth once daily 90 tablet 3  . Multiple Vitamin (MULTIVITAMIN) capsule Take 1 capsule by mouth daily.    . Omega-3 Fatty Acids (FISH OIL) 1000 MG CAPS Take 1 capsule by mouth daily.     . ramipril (ALTACE) 10 MG capsule take 1 capsule once daily 90 capsule 3  .  rosuvastatin (CRESTOR) 5 MG tablet Take 1 tablet (5 mg total) by mouth at bedtime. 90 tablet 3  . tamsulosin (FLOMAX) 0.4 MG CAPS capsule Take 0.4 mg by mouth daily.    . vitamin B-12 (CYANOCOBALAMIN) 1000 MCG tablet Take 1,000 mcg by mouth daily.    Marland Kitchen ezetimibe (ZETIA) 10 MG tablet Take 1 tablet (10 mg total) by mouth daily. 90 tablet 3   No current facility-administered medications for this visit.     LABS/IMAGING: No results found for this or any previous visit (from the past 48 hour(s)). No results found.  VITALS: BP (!) 142/52   Pulse 75   Ht 5\' 8"  (1.727 m)   Wt 179 lb 3.2 oz (81.3 kg)   BMI 27.25 kg/m   EXAM: General appearance: alert and no distress Neck: no carotid bruit and no JVD Lungs: clear to auscultation bilaterally Heart: regular rate and rhythm, S1, S2 normal, no murmur, click, rub or gallop Abdomen: soft, non-tender; bowel sounds normal; no masses,  no organomegaly Extremities: cool to touch, 1+ pulses, no edema Pulses: 1+ Skin: pale, cool, dry Neurologic: Mental status: Alert, oriented, thought content appropriate Psych:  Normal  EKG: Normal sinus rhythm at 75  ASSESSMENT: 1. Coronary artery disease status post single vessel CABG in 2009 (free right radial to PDA) 2. Numerous prior coronary interventions to the RCA and LAD with brachii therapy 3. PAD status post right common iliac and right superficial femoral artery stents in 2003 4. Hypertension 5. Dyslipidemia  PLAN: 1.   Mr. Cavness is had some possible side effects related to Crestor. Plan to decrease the dose to 5 mg daily and start Zetia 10 mg daily for additional benefit. Will repeat a lipid profile in 3 months. Follow-up with me annually or sooner as necessary.  Pixie Casino, MD, [facility] Attending Cardiologist [place] 11/24/2016, 4:32 PM

## 2016-12-11 ENCOUNTER — Other Ambulatory Visit: Payer: Self-pay

## 2016-12-11 DIAGNOSIS — R911 Solitary pulmonary nodule: Secondary | ICD-10-CM

## 2016-12-13 ENCOUNTER — Ambulatory Visit (HOSPITAL_COMMUNITY)
Admission: RE | Admit: 2016-12-13 | Discharge: 2016-12-13 | Disposition: A | Discharge status: Home / Self Care | Payer: Medicare Other | Source: Ambulatory Visit | Attending: Internal Medicine | Admitting: Internal Medicine

## 2016-12-13 ENCOUNTER — Encounter (HOSPITAL_COMMUNITY): Payer: Self-pay

## 2016-12-13 DIAGNOSIS — R911 Solitary pulmonary nodule: Secondary | ICD-10-CM

## 2016-12-19 ENCOUNTER — Ambulatory Visit (HOSPITAL_COMMUNITY)
Admission: RE | Admit: 2016-12-19 | Discharge: 2016-12-19 | Disposition: A | Discharge status: Home / Self Care | Payer: Medicare Other | Source: Ambulatory Visit | Attending: Internal Medicine | Admitting: Internal Medicine

## 2016-12-19 DIAGNOSIS — I700 Atherosclerosis of aorta: Secondary | ICD-10-CM | POA: Diagnosis not present

## 2016-12-19 DIAGNOSIS — R918 Other nonspecific abnormal finding of lung field: Secondary | ICD-10-CM | POA: Diagnosis not present

## 2016-12-19 DIAGNOSIS — R911 Solitary pulmonary nodule: Secondary | ICD-10-CM | POA: Diagnosis present

## 2016-12-19 LAB — POCT I-STAT CREATININE: CREATININE: 1 mg/dL (ref 0.61–1.24)

## 2016-12-19 MED ORDER — IOPAMIDOL (ISOVUE-300) INJECTION 61%
75.0000 mL | Freq: Once | INTRAVENOUS | Status: AC | PRN
  Administered 2016-12-19: 75 mL via INTRAVENOUS

## 2017-02-08 ENCOUNTER — Other Ambulatory Visit: Payer: Self-pay | Admitting: Internal Medicine

## 2017-02-08 NOTE — Telephone Encounter (Signed)
Rx(s) sent to pharmacy electronically.  

## 2017-02-16 ENCOUNTER — Other Ambulatory Visit: Payer: Self-pay | Admitting: Internal Medicine

## 2017-02-16 NOTE — Telephone Encounter (Signed)
Rx(s) sent to pharmacy electronically.  

## 2017-07-31 ENCOUNTER — Telehealth: Payer: Self-pay | Admitting: Internal Medicine

## 2017-07-31 DIAGNOSIS — I2510 Atherosclerotic heart disease of native coronary artery without angina pectoris: Secondary | ICD-10-CM

## 2017-07-31 MED ORDER — EZETIMIBE 10 MG PO TABS: 10.0000 mg | ORAL_TABLET | Freq: Every day | ORAL | 2 refills | Status: DC

## 2017-07-31 MED ORDER — METOPROLOL TARTRATE 50 MG PO TABS: 50.0000 mg | ORAL_TABLET | Freq: Every day | ORAL | 2 refills | Status: DC

## 2017-07-31 NOTE — Telephone Encounter (Signed)
New Message      *STAT* If patient is at the pharmacy, call can be transferred to refill team.   1. Which medications need to be refilled? (please list name of each medication and dose if known)  His cholesterol medication does not know name -he has the rosuvastatin, needs the other   2. Which pharmacy/location (including street and city if local pharmacy) is medication to be sent to? [facility] [place]   3. Do they need a 30 day or 90 day supply?  [place]

## 2017-07-31 NOTE — Telephone Encounter (Signed)
Left message on patients voicemail informing him that I sent rx over to pharmacy; advise pt to call back if he had any concerns.

## 2017-07-31 NOTE — Telephone Encounter (Signed)
New message     *STAT* If patient is at the pharmacy, call can be transferred to refill team.   1. Which medications need to be refilled? (please list name of each medication and dose if known) metoprolol   2. Which pharmacy/location (including street and city if local pharmacy) is medication to be sent to? [facility] in [place]  3. Do they need a 30 day or 90 day supply? 30 day

## 2017-11-23 ENCOUNTER — Ambulatory Visit (INDEPENDENT_AMBULATORY_CARE_PROVIDER_SITE_OTHER): Payer: Medicare Other | Admitting: Internal Medicine

## 2017-11-23 ENCOUNTER — Encounter: Payer: Self-pay | Admitting: Internal Medicine

## 2017-11-23 VITALS — BP 130/60 | HR 67 | Ht 68.0 in | Wt 174.4 lb

## 2017-11-23 DIAGNOSIS — E782 Mixed hyperlipidemia: Secondary | ICD-10-CM

## 2017-11-23 DIAGNOSIS — I739 Peripheral vascular disease, unspecified: Secondary | ICD-10-CM | POA: Diagnosis not present

## 2017-11-23 DIAGNOSIS — I10 Essential (primary) hypertension: Secondary | ICD-10-CM

## 2017-11-23 DIAGNOSIS — I2510 Atherosclerotic heart disease of native coronary artery without angina pectoris: Secondary | ICD-10-CM

## 2017-11-23 DIAGNOSIS — Z951 Presence of aortocoronary bypass graft: Secondary | ICD-10-CM

## 2017-11-23 NOTE — Patient Instructions (Signed)
Your physician recommends that you return for lab work FASTING to check cholesterol. You can have this done at [facility] in [place].   Your physician wants you to follow-up in: ONE YEAR with Dr. Debara Pickett. You will receive a reminder letter in the mail two months in advance. If you don't receive a letter, please call our office to schedule the follow-up appointment.

## 2017-11-26 ENCOUNTER — Encounter: Payer: Self-pay | Admitting: Internal Medicine

## 2017-11-26 NOTE — Addendum Note (Signed)
Addended by: Pixie Casino on: 11/26/2017 02:16 PM   Modules accepted: Level of Service

## 2017-11-26 NOTE — Progress Notes (Addendum)
OFFICE NOTE  Chief Complaint:  Annual follow-up, occasional lightheadedness  Primary Care Physician: Sharilyn Sites, MD  HPI:  Jonathan [name] is a pleasant 66 year old male patient who was formerly followed by Dr. Rollene Fare. He has an extensive past medical history of coronary disease. He has had numerous prior stents to the right coronary artery and LAD and underwent brachii therapy to the LAD. Eventually developed occlusion of the right coronary artery and ultimately underwent a right free radial graft to the PDA by Dr. Cyndia Bent in 2009. He also has a history of peripheral arterial disease and underwent right common iliac and femoral artery stenting in 2003, but does have bilateral disease which has not been studied since 2009. In addition he has hypertension, GERD and is here today for preoperative cardiac evaluation. He's been having numbness and tingling in his hands as well as legs and is felt to have a cord problem in the neck. His neurosurgeon is Dr. Cyndy Freeze and was planning on operating yesterday however he was noted to have an abnormal EKG and was sent for preoperative evaluation. It should be noted he had a negative nuclear stress test for ischemia in 07/2013. Currently denies any chest pain. He is able to exert himself even walking up stairs without shortness of breath but is having problems with numbness and tingling in his legs as well as cool extremities.  Jonathan [name] returns today for follow-up. He underwent lumbar surgery by Dr. Cyndy Freeze and he reports he still has problems with leg pain and cramping. He denies any chest pain or worsening shortness of breath. Blood pressure is well-controlled today.  11/23/2016  Jonathan [name] returns today for follow-up. He is generally without complaints. He reports that he is taking 10 mg of Crestor every other day due to some leg pains. He says this is improved his symptoms somewhat. Cholesterol however is not at goal. EKG shows normal sinus rhythm. He  has no chest pain or shortness of breath. He continues to say that he's had no significant improvement in his back pain with back surgery.  11/23/2017  Jonathan [name] was seen today in follow-up.  Overall he seems to be doing well.  He reports some occasional lightheadedness but no chest pain or worsening shortness of breath.  He reports some fatigue in his legs but no significant claudication.  He did have some mild PAD in 2015 which was unchanged since 2009.  He is also overdue for a lipid profile.  He has not followed up with his primary care provider except for when he gets sick.  PMHx:  Past Medical History:  Diagnosis Date  . Anemia   . CAD (coronary artery disease)   . Femoral artery stenosis ([place])   . Gastroesophageal reflux   . H/O hiatal hernia   . Hyperlipidemia   . Hypertension   . Myocardial infarction ([place]) 11/2001  . PAD (peripheral artery disease) (HCC)    bilateral iliac artery stents   . PONV (postoperative nausea and vomiting)    most of the time he gets nauseated  . Prostate hyperplasia with urinary obstruction   . S/P CABG x 1 2009   R radial to PDA  . Tobacco consumption    quit cigarettes in 2008, smokes 5-6 cigars/week    Past Surgical History:  Procedure Laterality Date  . ANTERIOR CERVICAL DECOMP/DISCECTOMY FUSION N/A 10/22/2014   Procedure: CERVICAL SIX SEVEN ANTERIOR CERVICAL DECOMPRESSION/DISCECTOMY FUSION  ;  Surgeon: Ashok Pall, MD;  Location: [place]  ORS;  Service: Neurosurgery;  Laterality: N/A;  C67 Possible C7T1 anterior cervical decompression with fusion plating and bonegraft  . BACK SURGERY    . CARDIAC CATHETERIZATION  12/03/2001   2.5x69mm S-660 stent to RCA (MI)  . CARDIAC CATHETERIZATION  05/06/2002   PTCA & stenting of LAD and PTCA & stenting of mid/distal RCA with 3.0x48mm Zeta stent  . CARDIAC CATHETERIZATION  06/03/2002   LAD with prox 30% in-stent restenosis; ramus with 95% ostial lesion; L Cfx & 3-OMs free of disease; mild 30% RCA  narrowing & 20-30% in-stent restenosis in RCA (Dr. Gerrie Nordmann)  . CARDIAC CATHETERIZATION  03/24/2003   overlapping tandem DES 3.0x23 Cypher stent to mid RCA for in-stent restenosis & new lesion beyond previous stent with 3.0x12 SciMed TAXUS (Dr. Marella Chimes)  . CARDIAC CATHETERIZATION  12/08/2003   3.0x24 TAXUS overlapping 3.0x74mm TAXUS DES in RCA and cuttin balloon atherectomy (Dr. Marella Chimes)  . CARDIAC CATHETERIZATION  07/10/2007   normal L main, L Cfx & 3-OMs free of disease; LAD with Zeta stent in prox region (patent); RCA with multiple stents (3.0x12 Taxus in ostium with 3.0x20 Taxus overlapping it, and series of 3.0x24 Tacus and Cypher stent overlapping this, and in distal RC 2x5x1mm Zeta(?) stent; ostium of RCA with 80% narrowing - 3x15 cutting balloon - 80% to patient w/NO obstruction (Dr. Loni Muse. Little)  . CARDIAC CATHETERIZATION  06/08/2008   high-grade osital & prox in-stent RCA restenosis - Dr. Marella Chimes  . COLONOSCOPY N/A 11/22/2015   Procedure: COLONOSCOPY;  Surgeon: Daneil Dolin, MD;  [facility];  Service: Endoscopy;  Laterality: N/A;  100  . CORONARY ARTERY BYPASS GRAFT  2009   free right radial to PDA for progressive RCA disease & multiple re-stenosis (Dr. Ellison Hughs)  . ILIAC ARTERY STENT Bilateral 01/30/2002   9x21mm Cordis SmartStent to LCIA, 9x3.65mm Genesis stent to RCIA (Dr. Adora Fridge)  . Lower Extremity Arterial Doppler  2009   R CIA stent - less than 50% diameter reduction; R CFA stent - 0-49% diameter reduction; R SFA less than 50% diameter reduction; L CFA, profunda, SFA - equal/less than 50% diameter reduction  . LUMBAR LAMINECTOMY/DECOMPRESSION MICRODISCECTOMY Bilateral 03/18/2015   Procedure: LUMBAR LAMINECTOMY/DECOMPRESSION MICRODISCECTOMY 2 LEVELS;  Surgeon: Ashok Pall, MD;  Location: [place] [facility];  Service: Neurosurgery;  Laterality: Bilateral;  Bilateral L34 L45 laminectomies  . NM MYOCAR PERF WALL MOTION  07/2013   bruce myoview - intermediate risk  test with abnormal inferior horizontal ST depression; mild, fixed inferobasal defect, but no ischemia  . TRANSTHORACIC ECHOCARDIOGRAM  07/2013   EF 55-60%, mild LVH; mild MR; LA mildly dilated; RVSP increased; PA peak pressure 30mmHg (mod pulm HTN)    FAMHx:  Family History  Problem Relation Age of Onset  . Heart disease Father   . CAD Brother        CABG  . CAD Brother        CABG  . Colon cancer Neg Hx     SOCHx:   reports that he has been smoking cigarettes.  He has a 6.00 pack-year smoking history. he has never used smokeless tobacco. He reports that he drinks about 7.2 oz of alcohol per week. He reports that he does not use drugs.  ALLERGIES:  No Known Allergies  ROS: Pertinent items noted in HPI and remainder of comprehensive ROS otherwise negative.  HOME MEDS: Current Outpatient Medications  Medication Sig Dispense Refill  . aspirin EC 81 MG tablet Take  81 mg by mouth daily.    Marland Kitchen esomeprazole (NEXIUM) 20 MG capsule Take 20 mg by mouth daily at 12 noon.    . metoprolol tartrate (LOPRESSOR) 50 MG tablet Take 1 tablet (50 mg total) by mouth daily. 90 tablet 2  . Multiple Vitamin (MULTIVITAMIN) capsule Take 1 capsule by mouth daily.    . Omega-3 Fatty Acids (FISH OIL) 1000 MG CAPS Take 1 capsule by mouth daily.     . ramipril (ALTACE) 10 MG capsule take 1 capsule by mouth once daily 90 capsule 3  . rosuvastatin (CRESTOR) 5 MG tablet Take 1 tablet (5 mg total) by mouth at bedtime. 90 tablet 3  . tamsulosin (FLOMAX) 0.4 MG CAPS capsule Take 0.4 mg by mouth daily.    . vitamin B-12 (CYANOCOBALAMIN) 1000 MCG tablet Take 1,000 mcg by mouth daily.    Marland Kitchen ezetimibe (ZETIA) 10 MG tablet Take 1 tablet (10 mg total) by mouth daily. 90 tablet 2   No current facility-administered medications for this visit.     LABS/IMAGING: No results found for this or any previous visit (from the past 48 hour(s)). No results found.  VITALS: BP 130/60   Pulse 67   Ht 5\' 8"  (1.727 m)   Wt 174 lb  6.4 oz (79.1 kg)   BMI 26.52 kg/m   EXAM: General appearance: alert and no distress Neck: no carotid bruit and no JVD Lungs: clear to auscultation bilaterally Heart: regular rate and rhythm, S1, S2 normal, no murmur, click, rub or gallop Abdomen: soft, non-tender; bowel sounds normal; no masses,  no organomegaly Extremities: cool to touch, diminished pulses no edema Pulses: 1+ Skin: pale, cool, dry Neurologic: Mental status: Alert, oriented, thought content appropriate Psych: Normal  EKG: Normal sinus rhythm at 67-personally reviewed  ASSESSMENT: 1. Coronary artery disease status post single vessel CABG in 2009 (free right radial to PDA) 2. Numerous prior coronary interventions to the RCA and LAD with brachii therapy 3. PAD status post right common iliac and right superficial femoral artery stents in 2003 4. Hypertension 5. Dyslipidemia  PLAN: 1.   Jonathan [name] seems to be doing well without any new coronary complaints.  He had single-vessel CABG in 2009 and had a negative Myoview in 2014.  He last had lower extremity arterial Dopplers in 2015 and has a prior stent in the right common iliac and right superficial femoral arteries.  He gets fatigue in his legs but denies any claudication.  We will continue to follow that clinically  We will also get a lipid profile is at overdue.  Blood pressure is at goal today.  Follow-up with me annually or sooner as necessary.    Pixie Casino, MD, [facility], [place] Director of the [facility] Lipid Disorders &  Card[facility]ovascular [facility] Attending Cardiologist  Direct Dial: [phone]  Fax: [phone]  Website:  [email or web address]  Jonathan [name] 11/26/2017, 2:12 PM

## 2017-11-28 LAB — LIPID PANEL
CHOL/HDL RATIO: 3.1 ratio (ref 0.0–5.0)
Cholesterol, Total: 102 mg/dL (ref 100–199)
HDL: 33 mg/dL — ABNORMAL LOW (ref >39)
LDL CALC: 24 mg/dL (ref 0–99)
Triglycerides: 226 mg/dL — ABNORMAL HIGH (ref 0–149)
VLDL CHOLESTEROL CAL: 45 mg/dL — AB (ref 5–40)

## 2017-11-29 ENCOUNTER — Telehealth: Payer: Self-pay | Admitting: Internal Medicine

## 2017-11-29 DIAGNOSIS — E7800 Pure hypercholesterolemia, unspecified: Secondary | ICD-10-CM

## 2017-11-29 MED ORDER — ICOSAPENT ETHYL 1 G PO CAPS: 2.0000 g | ORAL_CAPSULE | Freq: Two times a day (BID) | ORAL | 6 refills | Status: DC

## 2017-11-29 NOTE — Telephone Encounter (Signed)
Spoke with pt, ;ab work discussed. New script sent to the pharmacy and Lab orders mailed to the pt

## 2017-11-29 NOTE — Telephone Encounter (Signed)
New message  Pt verbalized that he is calling for the rn   for his lab results

## 2018-01-24 ENCOUNTER — Other Ambulatory Visit: Payer: Self-pay | Admitting: Internal Medicine

## 2018-01-24 DIAGNOSIS — I2510 Atherosclerotic heart disease of native coronary artery without angina pectoris: Secondary | ICD-10-CM

## 2018-01-25 NOTE — Telephone Encounter (Signed)
REFILL 

## 2018-02-11 ENCOUNTER — Other Ambulatory Visit: Payer: Self-pay | Admitting: Internal Medicine

## 2018-04-01 ENCOUNTER — Other Ambulatory Visit: Payer: Self-pay

## 2018-04-01 MED ORDER — RAMIPRIL 10 MG PO CAPS: 10.0000 mg | ORAL_CAPSULE | Freq: Every day | ORAL | 3 refills | Status: DC

## 2018-06-11 ENCOUNTER — Other Ambulatory Visit: Payer: Self-pay | Admitting: Internal Medicine

## 2018-06-11 ENCOUNTER — Other Ambulatory Visit: Payer: Self-pay | Admitting: *Deleted

## 2018-06-11 DIAGNOSIS — I2510 Atherosclerotic heart disease of native coronary artery without angina pectoris: Secondary | ICD-10-CM

## 2018-06-11 MED ORDER — ROSUVASTATIN CALCIUM 5 MG PO TABS: 5.0000 mg | ORAL_TABLET | Freq: Every day | ORAL | 1 refills | Status: DC

## 2018-11-06 ENCOUNTER — Other Ambulatory Visit: Payer: Self-pay | Admitting: Internal Medicine

## 2018-12-30 ENCOUNTER — Other Ambulatory Visit: Payer: Self-pay | Admitting: Internal Medicine

## 2018-12-30 DIAGNOSIS — I2510 Atherosclerotic heart disease of native coronary artery without angina pectoris: Secondary | ICD-10-CM

## 2019-01-01 NOTE — Telephone Encounter (Signed)
Rx(s) sent to pharmacy electronically.  

## 2019-01-03 ENCOUNTER — Other Ambulatory Visit: Payer: Self-pay | Admitting: Internal Medicine

## 2019-01-03 DIAGNOSIS — I2510 Atherosclerotic heart disease of native coronary artery without angina pectoris: Secondary | ICD-10-CM

## 2019-02-04 ENCOUNTER — Other Ambulatory Visit: Payer: Self-pay | Admitting: Internal Medicine

## 2019-02-04 DIAGNOSIS — I2510 Atherosclerotic heart disease of native coronary artery without angina pectoris: Secondary | ICD-10-CM

## 2019-02-17 ENCOUNTER — Other Ambulatory Visit: Payer: Self-pay | Admitting: Internal Medicine

## 2019-04-16 ENCOUNTER — Telehealth: Payer: Self-pay

## 2019-04-16 NOTE — Telephone Encounter (Signed)
Attempted to contact pt to change appointment to virtual visit and reschedule in may. Left message to call back.

## 2019-04-17 NOTE — Telephone Encounter (Signed)
Spoke with pt who state he would rather reschedule appointment and will call back to schedule.

## 2019-04-17 NOTE — Telephone Encounter (Signed)
New Message   Patient returning your call and has a smart phone would like you to call to help set up for virtual visit.

## 2019-04-22 ENCOUNTER — Ambulatory Visit: Payer: Medicare Other | Admitting: Internal Medicine

## 2019-06-25 ENCOUNTER — Other Ambulatory Visit: Payer: Self-pay | Admitting: Internal Medicine

## 2019-06-27 ENCOUNTER — Other Ambulatory Visit: Payer: Self-pay | Admitting: Internal Medicine

## 2019-06-30 NOTE — Telephone Encounter (Signed)
Rx(s) sent to pharmacy electronically.  

## 2019-07-24 ENCOUNTER — Ambulatory Visit (INDEPENDENT_AMBULATORY_CARE_PROVIDER_SITE_OTHER): Payer: Medicare Other | Admitting: Physician Assistant

## 2019-07-24 ENCOUNTER — Encounter (INDEPENDENT_AMBULATORY_CARE_PROVIDER_SITE_OTHER): Payer: Self-pay

## 2019-07-24 ENCOUNTER — Other Ambulatory Visit: Payer: Self-pay

## 2019-07-24 VITALS — BP 139/67 | HR 64 | Temp 98.4°F | Ht 67.0 in | Wt 171.0 lb

## 2019-07-24 DIAGNOSIS — R0789 Other chest pain: Secondary | ICD-10-CM | POA: Diagnosis not present

## 2019-07-24 DIAGNOSIS — E785 Hyperlipidemia, unspecified: Secondary | ICD-10-CM | POA: Diagnosis not present

## 2019-07-24 DIAGNOSIS — I25709 Atherosclerosis of coronary artery bypass graft(s), unspecified, with unspecified angina pectoris: Secondary | ICD-10-CM | POA: Diagnosis not present

## 2019-07-24 DIAGNOSIS — I739 Peripheral vascular disease, unspecified: Secondary | ICD-10-CM | POA: Diagnosis not present

## 2019-07-24 DIAGNOSIS — I10 Essential (primary) hypertension: Secondary | ICD-10-CM

## 2019-07-24 DIAGNOSIS — R079 Chest pain, unspecified: Secondary | ICD-10-CM

## 2019-07-24 MED ORDER — METOPROLOL SUCCINATE ER 50 MG PO TB24: 50.0000 mg | ORAL_TABLET | Freq: Every day | ORAL | 3 refills | Status: DC

## 2019-07-24 NOTE — Progress Notes (Signed)
Cardiology Office Note    Date:  07/26/2019   ID:  Jonathan [name], DOB [birth year], MRN [id number]  PCP:  Sharilyn Sites, MD  Cardiologist:  Dr. Debara Pickett  Chief Complaint  Patient presents with  . Follow-up    seen for Dr. Debara Pickett.     History of Present Illness:  Jonathan [name] is a 68 y.o. male with PMH of CAD s/p CABG, HTN, HLD, PAD s/p of bilateral iliac artery stents, and former tobacco use.  He is a former patient of Dr. Rollene Fare.  He also had prior history of stents to the RCA and LAD and underwent brachii therapy to the LAD.  He eventually developed occlusion of the RCA and had a RIMA to PDA by Dr. Cyndia Bent in 2009.  He had right common iliac and femoral artery stenting in 2003.  He had a known small pulmonary nodule, repeat CT of the chest obtained on 12/19/2016 showed the nodules are unchanged, measuring up to 5 mm, no further work-up was recommended.  He was last seen by Dr. Debara Pickett in November 2018 at which time he was doing well.   Patient presents today for cardiology office visit.  For the past year, he has been having intermittent chest discomfort that tend to occur about once every 1 to 2 months.  There has been no increase in frequency of the chest discomfort nor is there any significant duration.  Last episode of chest discomfort was 5 days ago when he was carrying some tomato back to the house.  This likely represents stable angina for the past year.  Given no increase in frequency or duration, I recommended continue observation at this time.  He is aware that if symptom increase in frequency or duration, he will need additional ischemic evaluation.  He says he does not like stress test.  For the past year, he has been noticing some leg weakness and leg pain after walking.  The symptom is worse on the right side.  This only occurs with exertion and the likely consistent with PAD with claudication.  I recommended ABI and lower extremity arterial Doppler.  He will need a 32-month follow-up.   Otherwise he has no lower extremity edema, orthopnea or PND.  His blood pressure was borderline elevated, however he is taking metoprolol tartrate only on a daily basis.  I will switch him to metoprolol succinate 50 mg daily.  He is is overdue for lab work.  Since he is on the ramipril, I will obtain complete metabolic panel to look at electrolyte.  He will need a CBC and fasting lipid panel when he returns for the lower extremity ABI.   Past Medical History:  Diagnosis Date  . Anemia   . CAD (coronary artery disease)   . Femoral artery stenosis ([place])   . Gastroesophageal reflux   . H/O hiatal hernia   . Hyperlipidemia   . Hypertension   . Myocardial infarction ([place]) 11/2001  . PAD (peripheral artery disease) ([facility])    bilateral iliac artery stents   . PONV (postoperative nausea and vomiting)    most of the time he gets nauseated  . Prostate hyperplasia with urinary obstruction   . S/P CABG x 1 2009   R radial to PDA  . Tobacco consumption    quit cigarettes in 2008, smokes 5-6 cigars/week    Past Surgical History:  Procedure Laterality Date  . ANTERIOR CERVICAL DECOMP/DISCECTOMY FUSION N/A 10/22/2014   Procedure: CERVICAL SIX SEVEN ANTERIOR CERVICAL  DECOMPRESSION/DISCECTOMY FUSION  ;  Surgeon: Ashok Pall, MD;  Location: [facility];  Service: Neurosurgery;  Laterality: N/A;  C67 Possible C7T1 anterior cervical decompression with fusion plating and bonegraft  . BACK SURGERY    . CARDIAC CATHETERIZATION  12/03/2001   2.5x3mm S-660 stent to RCA (MI)  . CARDIAC CATHETERIZATION  05/06/2002   PTCA & stenting of LAD and PTCA & stenting of mid/distal RCA with 3.0x74mm Zeta stent  . CARDIAC CATHETERIZATION  06/03/2002   LAD with prox 30% in-stent restenosis; ramus with 95% ostial lesion; L Cfx & 3-OMs free of disease; mild 30% RCA narrowing & 20-30% in-stent restenosis in RCA (Dr. Gerrie Nordmann)  . CARDIAC CATHETERIZATION  03/24/2003   overlapping tandem DES 3.0x23 Cypher stent to mid RCA  for in-stent restenosis & new lesion beyond previous stent with 3.0x12 SciMed TAXUS (Dr. Marella Chimes)  . CARDIAC CATHETERIZATION  12/08/2003   3.0x24 TAXUS overlapping 3.0x66mm TAXUS DES in RCA and cuttin balloon atherectomy (Dr. Marella Chimes)  . CARDIAC CATHETERIZATION  07/10/2007   normal L main, L Cfx & 3-OMs free of disease; LAD with Zeta stent in prox region (patent); RCA with multiple stents (3.0x12 Taxus in ostium with 3.0x20 Taxus overlapping it, and series of 3.0x24 Tacus and Cypher stent overlapping this, and in distal RC 2x5x10mm Zeta(?) stent; ostium of RCA with 80% narrowing - 3x15 cutting balloon - 80% to patient w/NO obstruction (Dr. Loni Muse. Little)  . CARDIAC CATHETERIZATION  06/08/2008   high-grade osital & prox in-stent RCA restenosis - Dr. Marella Chimes  . COLONOSCOPY N/A 11/22/2015   Procedure: COLONOSCOPY;  Surgeon: Daneil Dolin, MD;  Location: [facility];  Service: Endoscopy;  Laterality: N/A;  100  . CORONARY ARTERY BYPASS GRAFT  2009   free right radial to PDA for progressive RCA disease & multiple re-stenosis (Dr. Ellison Hughs)  . ILIAC ARTERY STENT Bilateral 01/30/2002   9x9mm Cordis SmartStent to LCIA, 9x3.50mm Genesis stent to RCIA (Dr. Adora Fridge)  . Lower Extremity Arterial Doppler  2009   R CIA stent - less than 50% diameter reduction; R CFA stent - 0-49% diameter reduction; R SFA less than 50% diameter reduction; L CFA, profunda, SFA - equal/less than 50% diameter reduction  . LUMBAR LAMINECTOMY/DECOMPRESSION MICRODISCECTOMY Bilateral 03/18/2015   Procedure: LUMBAR LAMINECTOMY/DECOMPRESSION MICRODISCECTOMY 2 LEVELS;  Surgeon: Ashok Pall, MD;  Location: [facility];  Service: Neurosurgery;  Laterality: Bilateral;  Bilateral L34 L45 laminectomies  . NM MYOCAR PERF WALL MOTION  07/2013   bruce myoview - intermediate risk test with abnormal inferior horizontal ST depression; mild, fixed inferobasal defect, but no ischemia  . TRANSTHORACIC ECHOCARDIOGRAM  07/2013   EF 55-60%,  mild LVH; mild MR; LA mildly dilated; RVSP increased; PA peak pressure 49mmHg (mod pulm HTN)    Current Medications: Outpatient Medications Prior to Visit  Medication Sig Dispense Refill  . aspirin EC 81 MG tablet Take 81 mg by mouth daily.    Marland Kitchen esomeprazole (NEXIUM) 20 MG capsule Take 20 mg by mouth daily at 12 noon.    . ezetimibe (ZETIA) 10 MG tablet Take 1 tablet (10 mg total) by mouth daily. 90 tablet 2  . Icosapent Ethyl (VASCEPA) 1 g CAPS Take 2 g by mouth 2 (two) times daily. 120 capsule 6  . ramipril (ALTACE) 10 MG capsule Take 1 capsule (10 mg total) by mouth daily. NEEDS APPOINTMENT FOR FUTURE REFILLS. 15 capsule 0  . rosuvastatin (CRESTOR) 5 MG tablet TAKE 1 TABLET(5 MG) BY  MOUTH AT BEDTIME 30 tablet 0  . tamsulosin (FLOMAX) 0.4 MG CAPS capsule Take 0.4 mg by mouth daily.    . vitamin B-12 (CYANOCOBALAMIN) 1000 MCG tablet Take 1,000 mcg by mouth daily.    . metoprolol tartrate (LOPRESSOR) 50 MG tablet TAKE 1 TABLET BY MOUTH EVERY DAILY. OFFICE VISIT NEEDED FOR REFILLS 30 tablet 0  . Multiple Vitamin (MULTIVITAMIN) capsule Take 1 capsule by mouth daily.     No facility-administered medications prior to visit.      Allergies:   Patient has no known allergies.   Social History   Socioeconomic History  . Marital status: Married    Spouse name: Not on file  . Number of children: 2  . Years of education: Not on file  . Highest education level: Not on file  Occupational History  . Occupation: disability  Social Needs  . Financial resource strain: Not on file  . Food insecurity    Worry: Not on file    Inability: Not on file  . Transportation needs    Medical: Not on file    Non-medical: Not on file  Tobacco Use  . Smoking status: Current Some Day Smoker    Packs/day: 0.20    Years: 30.00    Pack years: 6.00    Types: Cigarettes  . Smokeless tobacco: Never Used  . Tobacco comment: 1 pk per week - cigarettes  Substance and Sexual Activity  . Alcohol use: Yes     Alcohol/week: 12.0 standard drinks    Types: 12 Cans of beer per week    Comment: beer 2-3 times per week  . Drug use: No    Types: Marijuana  . Sexual activity: Not on file  Lifestyle  . Physical activity    Days per week: Not on file    Minutes per session: Not on file  . Stress: Not on file  Relationships  . Social Herbalist on phone: Not on file    Gets together: Not on file    Attends religious service: Not on file    Active member of club or organization: Not on file    Attends meetings of clubs or organizations: Not on file    Relationship status: Not on file  Other Topics Concern  . Not on file  Social History Narrative  . Not on file     Family History:  The patient's family history includes CAD in his brother and brother; Heart disease in his father.   ROS:   Please see the history of present illness.    ROS All other systems reviewed and are negative.   PHYSICAL EXAM:   VS:  BP 139/67   Pulse 64   Temp 98.4 F (36.9 C) (Temporal)   Ht 5\' 7"  (1.702 m)   Wt 171 lb (77.6 kg)   SpO2 96%   BMI 26.78 kg/m    GEN: Well nourished, well developed, in no acute distress  HEENT: normal  Neck: no JVD, carotid bruits, or masses Cardiac: RRR; no murmurs, rubs, or gallops,no edema  Respiratory:  clear to auscultation bilaterally, normal work of breathing GI: soft, nontender, nondistended, + BS MS: no deformity or atrophy  Skin: warm and dry, no rash Neuro:  Alert and Oriented x 3, Strength and sensation are intact Psych: euthymic mood, full affect  Wt Readings from Last 3 Encounters:  07/24/19 171 lb (77.6 kg)  11/23/17 174 lb 6.4 oz (79.1 kg)  11/23/16 179 lb  3.2 oz (81.3 kg)      Studies/Labs Reviewed:   EKG:  EKG is ordered today.  The ekg ordered today demonstrates normal sinus rhythm without significant ST-T wave changes.  Recent Labs: No results found for requested labs within last 8760 hours.   Lipid Panel    Component Value Date/Time    CHOL 102 11/27/2017 0857   TRIG 226 (H) 11/27/2017 0857   HDL 33 (L) 11/27/2017 0857   CHOLHDL 3.1 11/27/2017 0857   CHOLHDL 4.9 11/21/2016 0925   VLDL 61 (H) 11/21/2016 0925   LDLCALC 24 11/27/2017 0857    Additional studies/ records that were reviewed today include:   Echo 08/19/2013 LV EF: 55% -  60%   ------------------------------------------------------------  Indications:   Murmur 785.2.   ------------------------------------------------------------  History:  PMH:  Coronary artery disease. Risk factors:  Former tobacco use. Dyslipidemia.   ------------------------------------------------------------  Study Conclusions   - Left ventricle: The cavity size was normal. Wall thickness  was increased in a pattern of mild LVH. There was mild  concentric hypertrophy. Systolic function was normal. The  estimated ejection fraction was in the range of 55% to  60%. Wall motion was normal; there were no regional wall  motion abnormalities. Left ventricular diastolic function  parameters were normal.  - Mitral valve: Mild regurgitation.  - Left atrium: The atrium was mildly dilated.  - Right ventricle: Systolic pressure was increased.  - Atrial septum: No defect or patent foramen ovale was  identified.  - Pulmonary arteries: PA peak pressure: 72mm Hg (S).  Impressions:   - The right ventricular systolic pressure was increased  consistent with possible mild pulmonary hypertension.    Myoview 08/19/2013 Impression Exercise Capacity:  Good exercise capacity. BP Response:  Hypertensive blood pressure response. Clinical Symptoms:  Dyspnea, leg discomfort ECG Impression:  3 mm horizontal ST depression in II, III, and AVF at peak exercise Comparison with Prior Nuclear Study: No significant change from previous study  Overall Impression:  Intermediate risk stress nuclear study with abnormal inferior horizontal ST depression of 39mm at peak stress,  electrically diagnostic for ischemia. Nuclear images suggest a mild, fixed inferobasal defect with underlying bowel, suggesting attenuation, but no ischemia. Hypertensive response to exercise. Clinical correlation is recommended.  LV Wall Motion:  NL LV Function; NL Wall Motion    ASSESSMENT:    1. Chest pain of uncertain etiology   2. PAD (peripheral artery disease) ([place])   3. Coronary artery disease involving coronary bypass graft of native heart with angina pectoris ([place])   4. Hyperlipidemia, unspecified hyperlipidemia type   5. Essential hypertension      PLAN:  In order of problems listed above:  1. Chest pain: Symptoms consistent with stable angina.  This has been going on for the past year.  Symptom seems to occur every 1 to 2 months.  Total duration of chest pain is less than 5 minutes.  Given the stable nature of the symptom, I recommended continue monitoring.  If symptom began to progress with increasing frequency and duration, then we will proceed with additional ischemic work-up.  2. CAD: Last Myoview was in 2014.  Continue aspirin and statin.  3. PAD with claudication symptoms: Proceed with ABI.  If ABI abnormal, will refer the patient back to vascular surgery  4. Hypertension: Blood pressure stable, however he is only on once daily of metoprolol tartrate.  I will switch to metoprolol succinate.  5. Hyperlipidemia: Continue Vascepa, Zetia and Crestor  Medication Adjustments/Labs and Tests Ordered: Current medicines are reviewed at length with the patient today.  Concerns regarding medicines are outlined above.  Medication changes, Labs and Tests ordered today are listed in the Patient Instructions below. Patient Instructions  Medication Instructions:   STOP Metoprolol Tartrate  START Metoprolol Succinate 50 mg daily If you need a refill on your cardiac medications before your next appointment, please call your pharmacy.   Lab work: You will need to have labs  (blood work) drawn when you come to have you ABI in 2 weeks:  CBC  CMET  FASTING LIPID- DO NOT EAT OR DRINK PAST MIDNIGHT If you have labs (blood work) drawn today and your tests are completely normal, you will receive your results only by: Marland Kitchen MyChart Message (if you have MyChart) OR . A paper copy in the mail If you have any lab test that is abnormal or we need to change your treatment, we will call you to review the results.  Testing/Procedures:  Your physician has requested that you have an ankle brachial index (ABI). During this test an ultrasound and blood pressure cuff are used to evaluate the arteries that supply the arms and legs with blood. Allow thirty minutes for this exam. There are no restrictions or special instructions.  Your physician has requested that you have a lower extremity arterial duplex. This test is an ultrasound of the arteries in the legs. It looks at arterial blood flow in the legs. Allow one hour for Lower and Upper Arterial scans. There are no restrictions or special instructions  Please scheduled for 2 weeks   Follow-Up: At [facility], you and your health needs are our priority.  As part of our continuing mission to provide you with exceptional heart care, we have created designated Provider Care Teams.  These Care Teams include your primary Cardiologist (physician) and Advanced Practice Providers (APPs -  Physician Assistants and Nurse Practitioners) who all work together to provide you with the care you need, when you need it. . You will need a follow up appointment in 6 months.  Please call our office 2 months in advance to schedule this appointment with Pixie Casino, MD   Any Other Special Instructions Will Be Listed Below (If Applicable).   Continue to monitor Chest pains, call our office for an earlier appointment if the chest pains increase in frequency or duration      Hilbert Corrigan, [place]  07/26/2019 7:17 PM    [address], [place], [place]  [address] Phone: [phone]; Fax: [phone]

## 2019-07-24 NOTE — Patient Instructions (Addendum)
Medication Instructions:   STOP Metoprolol Tartrate  START Metoprolol Succinate 50 mg daily If you need a refill on your cardiac medications before your next appointment, please call your pharmacy.   Lab work: You will need to have labs (blood work) drawn when you come to have you ABI in 2 weeks:  CBC  CMET  FASTING LIPID- DO NOT EAT OR DRINK PAST MIDNIGHT If you have labs (blood work) drawn today and your tests are completely normal, you will receive your results only by: Marland Kitchen MyChart Message (if you have MyChart) OR . A paper copy in the mail If you have any lab test that is abnormal or we need to change your treatment, we will call you to review the results.  Testing/Procedures:  Your physician has requested that you have an ankle brachial index (ABI). During this test an ultrasound and blood pressure cuff are used to evaluate the arteries that supply the arms and legs with blood. Allow thirty minutes for this exam. There are no restrictions or special instructions.  Your physician has requested that you have a lower extremity arterial duplex. This test is an ultrasound of the arteries in the legs. It looks at arterial blood flow in the legs. Allow one hour for Lower and Upper Arterial scans. There are no restrictions or special instructions  Please scheduled for 2 weeks   Follow-Up: At [facility], you and your health needs are our priority.  As part of our continuing mission to provide you with exceptional heart care, we have created designated Provider Care Teams.  These Care Teams include your primary Cardiologist (physician) and Advanced Practice Providers (APPs -  Physician Assistants and Nurse Practitioners) who all work together to provide you with the care you need, when you need it. . You will need a follow up appointment in 6 months.  Please call our office 2 months in advance to schedule this appointment with Pixie Casino, MD   Any Other Special Instructions Will Be  Listed Below (If Applicable).   Continue to monitor Chest pains, call our office for an earlier appointment if the chest pains increase in frequency or duration

## 2019-07-26 ENCOUNTER — Encounter: Payer: Self-pay | Admitting: Physician Assistant

## 2019-07-30 ENCOUNTER — Other Ambulatory Visit (HOSPITAL_COMMUNITY): Payer: Self-pay | Admitting: Physician Assistant

## 2019-07-30 DIAGNOSIS — Z95828 Presence of other vascular implants and grafts: Secondary | ICD-10-CM

## 2019-08-12 ENCOUNTER — Other Ambulatory Visit (HOSPITAL_COMMUNITY): Payer: Self-pay | Admitting: Physician Assistant

## 2019-08-12 ENCOUNTER — Other Ambulatory Visit: Payer: Self-pay

## 2019-08-12 ENCOUNTER — Ambulatory Visit (HOSPITAL_BASED_OUTPATIENT_CLINIC_OR_DEPARTMENT_OTHER)
Admission: RE | Admit: 2019-08-12 | Discharge: 2019-08-12 | Disposition: A | Discharge status: Home / Self Care | Payer: Medicare Other | Source: Ambulatory Visit | Attending: Physician Assistant | Admitting: Physician Assistant

## 2019-08-12 ENCOUNTER — Ambulatory Visit (HOSPITAL_COMMUNITY)
Admission: RE | Admit: 2019-08-12 | Discharge: 2019-08-12 | Disposition: A | Discharge status: Home / Self Care | Payer: Medicare Other | Source: Ambulatory Visit | Attending: Cardiovascular Disease | Admitting: Cardiovascular Disease

## 2019-08-12 DIAGNOSIS — Z959 Presence of cardiac and vascular implant and graft, unspecified: Secondary | ICD-10-CM

## 2019-08-12 DIAGNOSIS — I25709 Atherosclerosis of coronary artery bypass graft(s), unspecified, with unspecified angina pectoris: Secondary | ICD-10-CM

## 2019-08-12 DIAGNOSIS — Z95828 Presence of other vascular implants and grafts: Secondary | ICD-10-CM

## 2019-08-12 DIAGNOSIS — I739 Peripheral vascular disease, unspecified: Secondary | ICD-10-CM

## 2019-08-12 DIAGNOSIS — E785 Hyperlipidemia, unspecified: Secondary | ICD-10-CM

## 2019-08-12 LAB — COMPREHENSIVE METABOLIC PANEL
ALT: 27 IU/L (ref 0–44)
AST: 21 IU/L (ref 0–40)
Albumin/Globulin Ratio: 1.8 (ref 1.2–2.2)
Albumin: 4.4 g/dL (ref 3.8–4.8)
Alkaline Phosphatase: 73 IU/L (ref 39–117)
BUN/Creatinine Ratio: 13 (ref 10–24)
BUN: 12 mg/dL (ref 8–27)
Bilirubin Total: 0.7 mg/dL (ref 0.0–1.2)
CO2: 23 mmol/L (ref 20–29)
Calcium: 9.4 mg/dL (ref 8.6–10.2)
Chloride: 102 mmol/L (ref 96–106)
Creatinine, Ser: 0.92 mg/dL (ref 0.76–1.27)
GFR calc Af Amer: 98 mL/min/{1.73_m2} (ref >59)
GFR calc non Af Amer: 85 mL/min/{1.73_m2} (ref >59)
Globulin, Total: 2.4 g/dL (ref 1.5–4.5)
Glucose: 95 mg/dL (ref 65–99)
Potassium: 5 mmol/L (ref 3.5–5.2)
Sodium: 138 mmol/L (ref 134–144)
Total Protein: 6.8 g/dL (ref 6.0–8.5)

## 2019-08-12 LAB — LIPID PANEL
Chol/HDL Ratio: 3 ratio (ref 0.0–5.0)
Cholesterol, Total: 108 mg/dL (ref 100–199)
HDL: 36 mg/dL — ABNORMAL LOW (ref >39)
LDL Calculated: 38 mg/dL (ref 0–99)
Triglycerides: 172 mg/dL — ABNORMAL HIGH (ref 0–149)
VLDL Cholesterol Cal: 34 mg/dL (ref 5–40)

## 2019-08-12 NOTE — Progress Notes (Signed)
Kidney function and electrolyte normal. Cholesterol very good except mild elevated triglyceride. Triglyceride is improving when compare to 1 year ago. Continue current medication

## 2019-08-12 NOTE — Progress Notes (Signed)
Some disease noted on the right side where previous iliac artery stent was placed, however his symptom is bilateral not just on the right side. Previous ABI is close to normal. Recommend continue observation and reevaluation of symptom on followup

## 2019-08-12 NOTE — Progress Notes (Signed)
See separate arterial doppler report, some disease noted on the right iliac artery, however given stable ABI, likely will continue to monitor and reassess symptom on followup

## 2019-08-12 NOTE — Progress Notes (Signed)
ABI stable, mild disease noted, but no significant disease to explain leg weakness

## 2019-08-13 LAB — CBC
Hematocrit: 42.5 % (ref 37.5–51.0)
Hemoglobin: 14.1 g/dL (ref 13.0–17.7)
MCH: 29.6 pg (ref 26.6–33.0)
MCHC: 33.2 g/dL (ref 31.5–35.7)
MCV: 89 fL (ref 79–97)
RBC: 4.77 x10E6/uL (ref 4.14–5.80)
RDW: 13.2 % (ref 11.6–15.4)
WBC: 7.1 10*3/uL (ref 3.4–10.8)

## 2019-08-21 ENCOUNTER — Telehealth: Payer: Self-pay

## 2019-08-21 NOTE — Telephone Encounter (Deleted)
Left voice message for the patient on his mobile number to give our office a call back for his labs and [place] results.

## 2019-08-21 NOTE — Telephone Encounter (Addendum)
Left voice message for the patient on his mobile number to give our office a call back for his labs and [place] results.  ----- Notes recorded by Almyra Deforest, PA on 08/12/2019 at 4:40 PM EDT  Kidney function and electrolyte normal. Cholesterol very good except mild elevated triglyceride. Triglyceride is improving when compare to 1 year ago. Continue current medication  Message from [place], [place] sent at 08/12/2019  4:41 PM EDT  ABI stable, mild disease noted, but no significant disease to explain leg weakness  Notes recorded by Almyra Deforest, PA on 08/12/2019 at 4:44 PM EDT  Some disease noted on the right side where previous iliac artery stent was placed, however his symptom is bilateral not just on the right side. Previous ABI is close to normal. Recommend continue observation and reevaluation of symptom on followup.

## 2019-08-24 ENCOUNTER — Other Ambulatory Visit: Payer: Self-pay | Admitting: Internal Medicine

## 2019-08-24 DIAGNOSIS — I2510 Atherosclerotic heart disease of native coronary artery without angina pectoris: Secondary | ICD-10-CM

## 2019-11-05 ENCOUNTER — Other Ambulatory Visit: Payer: Self-pay | Admitting: Internal Medicine

## 2019-11-05 DIAGNOSIS — I2510 Atherosclerotic heart disease of native coronary artery without angina pectoris: Secondary | ICD-10-CM

## 2020-01-29 ENCOUNTER — Other Ambulatory Visit: Payer: Self-pay | Admitting: Internal Medicine

## 2020-01-29 DIAGNOSIS — I2510 Atherosclerotic heart disease of native coronary artery without angina pectoris: Secondary | ICD-10-CM

## 2020-02-13 ENCOUNTER — Ambulatory Visit: Payer: Medicare Other | Admitting: Physician Assistant

## 2020-02-15 ENCOUNTER — Ambulatory Visit: Payer: Medicare Other | Attending: Internal Medicine

## 2020-02-15 ENCOUNTER — Ambulatory Visit: Payer: Medicare Other

## 2020-02-15 DIAGNOSIS — Z23 Encounter for immunization: Secondary | ICD-10-CM

## 2020-02-15 NOTE — Progress Notes (Signed)
   Covid-19 Vaccination Clinic  Name:  Jonathan [name]    MRN: [id number] DOB: [birth year]  02/15/2020  [name] was observed post Covid-19 immunization for 15 minutes without incidence. He was provided with Vaccine Information Sheet and instruction to access the V-Safe system.   [name] was instructed to call 911 with any severe reactions post vaccine: Marland Kitchen Difficulty breathing  . Swelling of your face and throat  . A fast heartbeat  . A bad rash all over your body  . Dizziness and weakness    Immunizations Administered    Name Date Dose VIS Date Route   Pfizer COVID-19 Vaccine 02/15/2020 11:55 AM 0.3 mL 12/05/2019 Intramuscular   Manufacturer: [place]   Lot: [id number]   [place]: [id number]

## 2020-02-19 ENCOUNTER — Ambulatory Visit: Payer: Medicare Other | Admitting: Physician Assistant

## 2020-02-19 ENCOUNTER — Other Ambulatory Visit: Payer: Self-pay

## 2020-02-19 VITALS — BP 142/80 | HR 75 | Temp 97.7°F | Ht 67.0 in | Wt 180.2 lb

## 2020-02-19 DIAGNOSIS — I25810 Atherosclerosis of coronary artery bypass graft(s) without angina pectoris: Secondary | ICD-10-CM

## 2020-02-19 DIAGNOSIS — I10 Essential (primary) hypertension: Secondary | ICD-10-CM | POA: Diagnosis not present

## 2020-02-19 DIAGNOSIS — I739 Peripheral vascular disease, unspecified: Secondary | ICD-10-CM | POA: Diagnosis not present

## 2020-02-19 DIAGNOSIS — E785 Hyperlipidemia, unspecified: Secondary | ICD-10-CM

## 2020-02-19 NOTE — Progress Notes (Signed)
Cardiology Office Note:    Date:  02/21/2020   ID:  KASHE TOOTHMAN, DOB June 26, 1951, MRN [id number]  PCP:  Sharilyn Sites, MD  Cardiologist:  Pixie Casino, MD  Electrophysiologist:  None   Referring MD: Sharilyn Sites, MD   Chief Complaint  Patient presents with  . Follow-up    seen for Dr. Debara Pickett    History of Present Illness:    Jonathan [name] is a 69 y.o. male with a hx of CAD s/p CABG, HTN, HLD, PAD s/p of bilateral iliac artery stents, and former tobacco use.  He is a former patient of Dr. Rollene Fare.  He also had prior history of stents to the RCA and LAD and underwent brachii therapy to the LAD.  He eventually developed occlusion of the RCA and had RIMA to PDA by Dr. Cyndia Bent in 2009.  He had right common iliac and femoral artery stenting in 2003.  He had a known small pulmonary nodule, repeat CT of the chest obtained on 12/19/2016 showed the nodules are unchanged, measuring up to 5 mm, no further work-up was recommended.  He was last seen by Dr. Debara Pickett in November 2018 at which time he was doing well.   Patient presents today for cardiology office visit.  He still occasionally has chest discomfort that occur every 1 to 2 months, this is similar to his previous symptom and appears to be unchanged from last year.  He is aware that if his symptoms does increase in frequency or duration, we can do additional work-up.  Otherwise he does not have any lower extremity edema, orthopnea or PND.  He can continue on the current therapy and follow-up in 6 months.  He will need a CMP and a fasting lipid panel prior to that visit.  Past Medical History:  Diagnosis Date  . Anemia   . CAD (coronary artery disease)   . Femoral artery stenosis ([place])   . Gastroesophageal reflux   . H/O hiatal hernia   . Hyperlipidemia   . Hypertension   . Myocardial infarction ([place]) 11/2001  . PAD (peripheral artery disease) ([facility])    bilateral iliac artery stents   . PONV (postoperative nausea and vomiting)    most of the time he gets nauseated  . Prostate hyperplasia with urinary obstruction   . S/P CABG x 1 2009   R radial to PDA  . Tobacco consumption    quit cigarettes in 2008, smokes 5-6 cigars/week    Past Surgical History:  Procedure Laterality Date  . ANTERIOR CERVICAL DECOMP/DISCECTOMY FUSION N/A 10/22/2014   Procedure: CERVICAL SIX SEVEN ANTERIOR CERVICAL DECOMPRESSION/DISCECTOMY FUSION  ;  Surgeon: Ashok Pall, MD;  Location: [facility];  Service: Neurosurgery;  Laterality: N/A;  C67 Possible C7T1 anterior cervical decompression with fusion plating and bonegraft  . BACK SURGERY    . CARDIAC CATHETERIZATION  12/03/2001   2.5x64mm S-660 stent to RCA (MI)  . CARDIAC CATHETERIZATION  05/06/2002   PTCA & stenting of LAD and PTCA & stenting of mid/distal RCA with 3.0x59mm Zeta stent  . CARDIAC CATHETERIZATION  06/03/2002   LAD with prox 30% in-stent restenosis; ramus with 95% ostial lesion; L Cfx & 3-OMs free of disease; mild 30% RCA narrowing & 20-30% in-stent restenosis in RCA (Dr. Gerrie Nordmann)  . CARDIAC CATHETERIZATION  03/24/2003   overlapping tandem DES 3.0x23 Cypher stent to mid RCA for in-stent restenosis & new lesion beyond previous stent with 3.0x12 SciMed TAXUS (Dr. Marella Chimes)  . CARDIAC  CATHETERIZATION  12/08/2003   3.0x24 TAXUS overlapping 3.0x53mm TAXUS DES in RCA and cuttin balloon atherectomy (Dr. Marella Chimes)  . CARDIAC CATHETERIZATION  07/10/2007   normal L main, L Cfx & 3-OMs free of disease; LAD with Zeta stent in prox region (patent); RCA with multiple stents (3.0x12 Taxus in ostium with 3.0x20 Taxus overlapping it, and series of 3.0x24 Tacus and Cypher stent overlapping this, and in distal RC 2x5x45mm Zeta(?) stent; ostium of RCA with 80% narrowing - 3x15 cutting balloon - 80% to patient w/NO obstruction (Dr. Loni Muse. Little)  . CARDIAC CATHETERIZATION  06/08/2008   high-grade osital & prox in-stent RCA restenosis - Dr. Marella Chimes  . COLONOSCOPY N/A 11/22/2015    Procedure: COLONOSCOPY;  Surgeon: Daneil Dolin, MD;  [facility];  Service: Endoscopy;  Laterality: N/A;  100  . CORONARY ARTERY BYPASS GRAFT  2009   free right radial to PDA for progressive RCA disease & multiple re-stenosis (Dr. Ellison Hughs)  . ILIAC ARTERY STENT Bilateral 01/30/2002   9x80mm Cordis SmartStent to LCIA, 9x3.60mm Genesis stent to RCIA (Dr. Adora Fridge)  . Lower Extremity Arterial Doppler  2009   R CIA stent - less than 50% diameter reduction; R CFA stent - 0-49% diameter reduction; R SFA less than 50% diameter reduction; L CFA, profunda, SFA - equal/less than 50% diameter reduction  . LUMBAR LAMINECTOMY/DECOMPRESSION MICRODISCECTOMY Bilateral 03/18/2015   Procedure: LUMBAR LAMINECTOMY/DECOMPRESSION MICRODISCECTOMY 2 LEVELS;  Surgeon: Ashok Pall, MD;  Location: [facility];  Service: Neurosurgery;  Laterality: Bilateral;  Bilateral L34 L45 laminectomies  . NM MYOCAR PERF WALL MOTION  07/2013   bruce myoview - intermediate risk test with abnormal inferior horizontal ST depression; mild, fixed inferobasal defect, but no ischemia  . TRANSTHORACIC ECHOCARDIOGRAM  07/2013   EF 55-60%, mild LVH; mild MR; LA mildly dilated; RVSP increased; PA peak pressure 80mmHg (mod pulm HTN)    Current Medications: No outpatient medications have been marked as taking for the 02/19/20 encounter (Office Visit) with Almyra Deforest, PA.     Allergies:   Patient has no known allergies.   Social History   Socioeconomic History  . Marital status: Married    Spouse name: Not on file  . Number of children: 2  . Years of education: Not on file  . Highest education level: Not on file  Occupational History  . Occupation: disability  Tobacco Use  . Smoking status: Current Some Day Smoker    Packs/day: 0.20    Years: 30.00    Pack years: 6.00    Types: Cigarettes  . Smokeless tobacco: Never Used  . Tobacco comment: 1 pk per week - cigarettes  Substance and Sexual Activity  . Alcohol use: Yes     Alcohol/week: 12.0 standard drinks    Types: 12 Cans of beer per week    Comment: beer 2-3 times per week  . Drug use: No    Types: Marijuana  . Sexual activity: Not on file  Other Topics Concern  . Not on file  Social History Narrative  . Not on file   Social Determinants of Health   Financial Resource Strain:   . Difficulty of Paying Living Expenses: Not on file  Food Insecurity:   . Worried About Charity fundraiser in the Last Year: Not on file  . Ran Out of Food in the Last Year: Not on file  Transportation Needs:   . Lack of Transportation (Medical): Not on file  . Lack  of Transportation (Non-Medical): Not on file  Physical Activity:   . Days of Exercise per Week: Not on file  . Minutes of Exercise per Session: Not on file  Stress:   . Feeling of Stress : Not on file  Social Connections:   . Frequency of Communication with Friends and Family: Not on file  . Frequency of Social Gatherings with Friends and Family: Not on file  . Attends Religious Services: Not on file  . Active Member of Clubs or Organizations: Not on file  . Attends Archivist Meetings: Not on file  . Marital Status: Not on file     Family History: The patient's family history includes CAD in his brother and brother; Heart disease in his father. There is no history of Colon cancer.  ROS:   Please see the history of present illness.     All other systems reviewed and are negative.  EKGs/Labs/Other Studies Reviewed:    The following studies were reviewed today:  Echo 08/19/2013 LV EF: 55% -  60%   ------------------------------------------------------------  Indications:   Murmur 785.2.   ------------------------------------------------------------  History:  PMH:  Coronary artery disease. Risk factors:  Former tobacco use. Dyslipidemia.   ------------------------------------------------------------  Study Conclusions   - Left ventricle: The cavity size was normal. Wall  thickness  was increased in a pattern of mild LVH. There was mild  concentric hypertrophy. Systolic function was normal. The  estimated ejection fraction was in the range of 55% to  60%. Wall motion was normal; there were no regional wall  motion abnormalities. Left ventricular diastolic function  parameters were normal.  - Mitral valve: Mild regurgitation.  - Left atrium: The atrium was mildly dilated.  - Right ventricle: Systolic pressure was increased.  - Atrial septum: No defect or patent foramen ovale was  identified.  - Pulmonary arteries: PA peak pressure: 71mm Hg (S).  Impressions:   - The right ventricular systolic pressure was increased  consistent with possible mild pulmonary hypertension.    EKG:  EKG is ordered today.  The ekg ordered today demonstrates normal sinus rhythm without significant ST-T wave changes  Recent Labs: 08/12/2019: ALT 27; BUN 12; Creatinine, Ser 0.92; Hemoglobin 14.1; Platelets CANCELED; Potassium 5.0; Sodium 138  Recent Lipid Panel    Component Value Date/Time   CHOL 108 08/12/2019 0918   TRIG 172 (H) 08/12/2019 0918   HDL 36 (L) 08/12/2019 0918   CHOLHDL 3.0 08/12/2019 0918   CHOLHDL 4.9 11/21/2016 0925   VLDL 61 (H) 11/21/2016 0925   LDLCALC 38 08/12/2019 0918    Physical Exam:    VS:  BP (!) 142/80   Pulse 75   Temp 97.7 F (36.5 C)   Ht 5\' 7"  (1.702 m)   Wt 180 lb 3.2 oz (81.7 kg)   SpO2 95%   BMI 28.22 kg/m     Wt Readings from Last 3 Encounters:  02/19/20 180 lb 3.2 oz (81.7 kg)  07/24/19 171 lb (77.6 kg)  11/23/17 174 lb 6.4 oz (79.1 kg)     GEN:  Well nourished, well developed in no acute distress HEENT: Normal NECK: No JVD; No carotid bruits LYMPHATICS: No lymphadenopathy CARDIAC: RRR, no murmurs, rubs, gallops RESPIRATORY:  Clear to auscultation without rales, wheezing or rhonchi  ABDOMEN: Soft, non-tender, non-distended MUSCULOSKELETAL:  No edema; No deformity  SKIN: Warm and dry NEUROLOGIC:   Alert and oriented x 3 PSYCHIATRIC:  Normal affect   ASSESSMENT:    1. Coronary artery  disease involving coronary bypass graft of native heart without angina pectoris   2. Hyperlipidemia, unspecified hyperlipidemia type   3. Essential hypertension   4. PAD (peripheral artery disease) (HCC)    PLAN:    In order of problems listed above:  1. CAD s/p CABG: He continued to have intermittent chest pain that occurs once every 1 to 2 months, this is chronic for him.  He is aware that if symptoms increase in frequency or duration, he will need to contact cardiology service  2. Hypertension: Blood pressure stable  3. Hyperlipidemia: Continue Crestor, Zetia and Vascepa  4. PAD: No significant claudication symptoms.   Medication Adjustments/Labs and Tests Ordered: Current medicines are reviewed at length with the patient today.  Concerns regarding medicines are outlined above.  Orders Placed This Encounter  Procedures  . Comprehensive metabolic panel  . Lipid panel  . EKG 12-Lead   No orders of the defined types were placed in this encounter.   Patient Instructions  Medication Instructions:  Your physician recommends that you continue on your current medications as directed. Please refer to the Current Medication list given to you today.  *If you need a refill on your cardiac medications before your next appointment, please call your pharmacy*  Lab Work: Your physician recommends that you return for FASTING lab work in: 6 months (before appointment with Dr. Debara Pickett)- we will mail lab slips (CMET, Lipid)  Testing/Procedures: NONE  Follow-Up: At [facility], you and your health needs are our priority.  As part of our continuing mission to provide you with exceptional heart care, we have created designated Provider Care Teams.  These Care Teams include your primary Cardiologist (physician) and Advanced Practice Providers (APPs -  Physician Assistants and Nurse Practitioners) who all  work together to provide you with the care you need, when you need it.  Your next appointment:   6 month(s)  The format for your next appointment:   In Person  Provider:   K. Mali Hilty, MD        Signed, Almyra Deforest, [place]  02/21/2020 11:13 PM    [place]

## 2020-02-19 NOTE — Patient Instructions (Signed)
Medication Instructions:  Your physician recommends that you continue on your current medications as directed. Please refer to the Current Medication list given to you today.  *If you need a refill on your cardiac medications before your next appointment, please call your pharmacy*  Lab Work: Your physician recommends that you return for FASTING lab work in: 6 months (before appointment with Dr. Debara Pickett)- we will mail lab slips (CMET, Lipid)  Testing/Procedures: NONE  Follow-Up: At [facility], you and your health needs are our priority.  As part of our continuing mission to provide you with exceptional heart care, we have created designated Provider Care Teams.  These Care Teams include your primary Cardiologist (physician) and Advanced Practice Providers (APPs -  Physician Assistants and Nurse Practitioners) who all work together to provide you with the care you need, when you need it.  Your next appointment:   6 month(s)  The format for your next appointment:   In Person  Provider:   K. Mali Hilty, MD

## 2020-02-21 ENCOUNTER — Encounter: Payer: Self-pay | Admitting: Physician Assistant

## 2020-03-10 ENCOUNTER — Ambulatory Visit: Payer: Medicare Other | Attending: Internal Medicine

## 2020-03-10 DIAGNOSIS — Z23 Encounter for immunization: Secondary | ICD-10-CM

## 2020-03-10 NOTE — Progress Notes (Signed)
   Covid-19 Vaccination Clinic  Name:  Jonathan [name]    MRN: [id number] DOB: Feb 05, 1951  03/10/2020  Jonathan [name] was observed post Covid-19 immunization for 15 minutes without incident. He was provided with Vaccine Information Sheet and instruction to access the V-Safe system.   Jonathan [name] was instructed to call 911 with any severe reactions post vaccine: Marland Kitchen Difficulty breathing  . Swelling of face and throat  . A fast heartbeat  . A bad rash all over body  . Dizziness and weakness   Immunizations Administered    Name Date Dose VIS Date Route   Pfizer COVID-19 Vaccine 03/10/2020  9:10 AM 0.3 mL 12/05/2019 Intramuscular   Manufacturer: [place]   Lot: [id number]   [place]: [id number]

## 2020-07-27 ENCOUNTER — Other Ambulatory Visit: Payer: Self-pay | Admitting: Internal Medicine

## 2020-07-27 DIAGNOSIS — I2510 Atherosclerotic heart disease of native coronary artery without angina pectoris: Secondary | ICD-10-CM

## 2020-08-03 ENCOUNTER — Other Ambulatory Visit: Payer: Self-pay | Admitting: Internal Medicine

## 2020-08-03 ENCOUNTER — Other Ambulatory Visit: Payer: Self-pay | Admitting: Physician Assistant

## 2020-08-11 ENCOUNTER — Inpatient Hospital Stay (HOSPITAL_COMMUNITY): Admission: RE | Admit: 2020-08-11 | Payer: Medicare Other | Source: Ambulatory Visit

## 2020-08-11 ENCOUNTER — Encounter (HOSPITAL_COMMUNITY): Payer: Medicare Other

## 2020-09-20 ENCOUNTER — Telehealth: Payer: Self-pay | Admitting: Internal Medicine

## 2020-09-20 NOTE — Telephone Encounter (Signed)
LVM FOR PATIENT TO RETURN CALL TO GET FOLLOW UP WITH HILTY SCHEDULED FROM RECALL LIST

## 2020-10-25 ENCOUNTER — Other Ambulatory Visit: Payer: Self-pay | Admitting: Internal Medicine

## 2020-10-25 DIAGNOSIS — I2510 Atherosclerotic heart disease of native coronary artery without angina pectoris: Secondary | ICD-10-CM

## 2020-10-27 ENCOUNTER — Other Ambulatory Visit: Payer: Self-pay | Admitting: Family Medicine

## 2020-10-27 ENCOUNTER — Other Ambulatory Visit (HOSPITAL_COMMUNITY): Payer: Self-pay | Admitting: Family Medicine

## 2020-10-27 DIAGNOSIS — F17200 Nicotine dependence, unspecified, uncomplicated: Secondary | ICD-10-CM

## 2020-11-08 ENCOUNTER — Encounter: Payer: Self-pay | Admitting: Internal Medicine

## 2020-11-24 ENCOUNTER — Other Ambulatory Visit: Payer: Self-pay

## 2020-11-24 ENCOUNTER — Other Ambulatory Visit: Payer: Self-pay | Admitting: Physician Assistant

## 2020-11-24 ENCOUNTER — Ambulatory Visit (HOSPITAL_COMMUNITY)
Admission: RE | Admit: 2020-11-24 | Discharge: 2020-11-24 | Disposition: A | Discharge status: Home / Self Care | Payer: Medicare Other | Source: Ambulatory Visit | Attending: Family Medicine | Admitting: Family Medicine

## 2020-11-24 DIAGNOSIS — J432 Centrilobular emphysema: Secondary | ICD-10-CM | POA: Diagnosis not present

## 2020-11-24 DIAGNOSIS — I700 Atherosclerosis of aorta: Secondary | ICD-10-CM | POA: Insufficient documentation

## 2020-11-24 DIAGNOSIS — F17210 Nicotine dependence, cigarettes, uncomplicated: Secondary | ICD-10-CM | POA: Diagnosis not present

## 2020-11-24 DIAGNOSIS — Z122 Encounter for screening for malignant neoplasm of respiratory organs: Secondary | ICD-10-CM | POA: Diagnosis not present

## 2020-11-24 DIAGNOSIS — F17200 Nicotine dependence, unspecified, uncomplicated: Secondary | ICD-10-CM

## 2020-11-24 DIAGNOSIS — I2510 Atherosclerotic heart disease of native coronary artery without angina pectoris: Secondary | ICD-10-CM | POA: Insufficient documentation

## 2021-01-23 ENCOUNTER — Other Ambulatory Visit: Payer: Self-pay | Admitting: Internal Medicine

## 2021-01-24 ENCOUNTER — Other Ambulatory Visit: Payer: Self-pay | Admitting: Internal Medicine

## 2021-01-28 ENCOUNTER — Telehealth: Payer: Self-pay | Admitting: Internal Medicine

## 2021-01-28 DIAGNOSIS — I2510 Atherosclerotic heart disease of native coronary artery without angina pectoris: Secondary | ICD-10-CM

## 2021-01-28 MED ORDER — EZETIMIBE 10 MG PO TABS: ORAL_TABLET | ORAL | 1 refills | Status: DC

## 2021-01-28 NOTE — Telephone Encounter (Signed)
*  STAT* If patient is at the pharmacy, call can be transferred to refill team.   1. Which medications need to be refilled? (please list name of each medication and dose if known) new prescription for a new pharmacy- his medicine will be less expensive- Ezetimibe   2. Which pharmacy/location (including street and city if local pharmacy) is medication to be sent to?  [facility] -  3. Do they need a 30 day or 90 day supply? 90 days and refills

## 2021-02-16 ENCOUNTER — Encounter: Payer: Self-pay | Admitting: Internal Medicine

## 2021-02-16 ENCOUNTER — Ambulatory Visit: Payer: Medicare HMO | Admitting: Internal Medicine

## 2021-02-16 ENCOUNTER — Other Ambulatory Visit: Payer: Self-pay

## 2021-02-16 VITALS — BP 160/58 | HR 79 | Ht 68.0 in | Wt 181.2 lb

## 2021-02-16 DIAGNOSIS — E785 Hyperlipidemia, unspecified: Secondary | ICD-10-CM | POA: Diagnosis not present

## 2021-02-16 DIAGNOSIS — I739 Peripheral vascular disease, unspecified: Secondary | ICD-10-CM | POA: Diagnosis not present

## 2021-02-16 DIAGNOSIS — I10 Essential (primary) hypertension: Secondary | ICD-10-CM | POA: Diagnosis not present

## 2021-02-16 DIAGNOSIS — I25810 Atherosclerosis of coronary artery bypass graft(s) without angina pectoris: Secondary | ICD-10-CM | POA: Diagnosis not present

## 2021-02-16 NOTE — Patient Instructions (Signed)

## 2021-02-16 NOTE — Progress Notes (Signed)
OFFICE NOTE  Chief Complaint:  Annual follow-up  Primary Care Physician: Jonathan Sites, MD  HPI:  Jonathan [name] is a pleasant 70 year old male patient who was formerly followed by Dr. Rollene [name]. He has an extensive past medical history of coronary disease. He has had numerous prior stents to the right coronary artery and LAD and underwent brachii therapy to the LAD. Eventually developed occlusion of the right coronary artery and ultimately underwent a right free radial graft to the PDA by Dr. Cyndia [name] in 2009. He also has a history of peripheral arterial disease and underwent right common iliac and femoral artery stenting in 2003, but does have bilateral disease which has not been studied since 2009. In addition he has hypertension, GERD and is here today for preoperative cardiac evaluation. He's been having numbness and tingling in his hands as well as legs and is felt to have a cord problem in the neck. His neurosurgeon is Dr. Cyndy [name] and was planning on operating yesterday however he was noted to have an abnormal EKG and was sent for preoperative evaluation. It should be noted he had a negative nuclear stress test for ischemia in 07/2013. Currently denies any chest pain. He is able to exert himself even walking up stairs without shortness of breath but is having problems with numbness and tingling in his legs as well as cool extremities.  [name] returns today for follow-up. He underwent lumbar surgery by Dr. Cyndy [name] and he reports he still has problems with leg pain and cramping. He denies any chest pain or worsening shortness of breath. Blood pressure is well-controlled today.  11/23/2016  Mr. Jonathan [name] returns today for follow-up. He is generally without complaints. He reports that he is taking 10 mg of Crestor every other day due to some leg pains. He says this is improved his symptoms somewhat. Cholesterol however is not at goal. EKG shows normal sinus rhythm. He has no chest pain or shortness  of breath. He continues to say that he's had no significant improvement in his back pain with back surgery.  11/23/2017  Mr. Jonathan [name] was seen today in follow-up.  Overall he seems to be doing well.  He reports some occasional lightheadedness but no chest pain or worsening shortness of breath.  He reports some fatigue in his legs but no significant claudication.  He did have some mild PAD in 2015 which was unchanged since 2009.  He is also overdue for a lipid profile.  He has not followed up with his primary care provider except for when he gets sick.  02/16/2021  Mr. Jonathan [name] returns today for follow-up.  Overall he seems to be doing well.  He had last seen by Jonathan Deforest, PA-C.  He had describes some intermittent chest pain however he says he is had none since then.  He also has a history of some PAD.  He had Dopplers back in 2020, but denies any claudication.  He says he plays golf is fairly active.  He is recently doing some bowling.  He had lipids reassessed in October which showed triglycerides were normal at 172.  He is not currently taking Vascepa, but is on ezetimibe and rosuvastatin.  Blood pressure is elevated today however he chalked it up to going out bowling last night and drinking probably too much alcohol.  PMHx:  Past Medical History:  Diagnosis Date  . Anemia   . CAD (coronary artery disease)   . Femoral artery stenosis ([place])   . Gastroesophageal reflux   .  H/O hiatal hernia   . Hyperlipidemia   . Hypertension   . Myocardial infarction ([place]) 11/2001  . PAD (peripheral artery disease) ([facility])    bilateral iliac artery stents   . PONV (postoperative nausea and vomiting)    most of the time he gets nauseated  . Prostate hyperplasia with urinary obstruction   . S/P CABG x 1 2009   R radial to PDA  . Tobacco consumption    quit cigarettes in 2008, smokes 5-6 cigars/week    Past Surgical History:  Procedure Laterality Date  . ANTERIOR CERVICAL DECOMP/DISCECTOMY FUSION N/A 10/22/2014    Procedure: CERVICAL SIX SEVEN ANTERIOR CERVICAL DECOMPRESSION/DISCECTOMY FUSION  ;  Surgeon: Jonathan Pall, MD;  Location: [facility];  Service: Neurosurgery;  Laterality: N/A;  C67 Possible C7T1 anterior cervical decompression with fusion plating and bonegraft  . BACK SURGERY    . CARDIAC CATHETERIZATION  12/03/2001   2.5x57mm S-660 stent to RCA (MI)  . CARDIAC CATHETERIZATION  05/06/2002   PTCA & stenting of LAD and PTCA & stenting of mid/distal RCA with 3.0x91mm Zeta stent  . CARDIAC CATHETERIZATION  06/03/2002   LAD with prox 30% in-stent restenosis; ramus with 95% ostial lesion; L Cfx & 3-OMs free of disease; mild 30% RCA narrowing & 20-30% in-stent restenosis in RCA (Dr. Gerrie Nordmann)  . CARDIAC CATHETERIZATION  03/24/2003   overlapping tandem DES 3.0x23 Cypher stent to mid RCA for in-stent restenosis & new lesion beyond previous stent with 3.0x12 SciMed TAXUS (Dr. Marella [name])  . CARDIAC CATHETERIZATION  12/08/2003   3.0x24 TAXUS overlapping 3.0x42mm TAXUS DES in RCA and cuttin balloon atherectomy (Dr. Marella [name])  . CARDIAC CATHETERIZATION  07/10/2007   normal L main, L Cfx & 3-OMs free of disease; LAD with Zeta stent in prox region (patent); RCA with multiple stents (3.0x12 Taxus in ostium with 3.0x20 Taxus overlapping it, and series of 3.0x24 Tacus and Cypher stent overlapping this, and in distal RC 2x5x35mm Zeta(?) stent; ostium of RCA with 80% narrowing - 3x15 cutting balloon - 80% to patient w/NO obstruction (Dr. Loni Muse. Little)  . CARDIAC CATHETERIZATION  06/08/2008   high-grade osital & prox in-stent RCA restenosis - Dr. Marella [name]  . COLONOSCOPY N/A 11/22/2015   Procedure: COLONOSCOPY;  Surgeon: Daneil Dolin, MD;  Location: [facility];  Service: Endoscopy;  Laterality: N/A;  100  . CORONARY ARTERY BYPASS GRAFT  2009   free right radial to PDA for progressive RCA disease & multiple re-stenosis (Dr. Ellison [name])  . ILIAC ARTERY STENT Bilateral 01/30/2002   9x77mm Cordis SmartStent to  LCIA, 9x3.16mm Genesis stent to RCIA (Dr. Adora [name])  . Lower Extremity Arterial Doppler  2009   R CIA stent - less than 50% diameter reduction; R CFA stent - 0-49% diameter reduction; R SFA less than 50% diameter reduction; L CFA, profunda, SFA - equal/less than 50% diameter reduction  . LUMBAR LAMINECTOMY/DECOMPRESSION MICRODISCECTOMY Bilateral 03/18/2015   Procedure: LUMBAR LAMINECTOMY/DECOMPRESSION MICRODISCECTOMY 2 LEVELS;  Surgeon: Jonathan Pall, MD;  Location: [facility];  Service: Neurosurgery;  Laterality: Bilateral;  Bilateral L34 L45 laminectomies  . NM MYOCAR PERF WALL MOTION  07/2013   bruce myoview - intermediate risk test with abnormal inferior horizontal ST depression; mild, fixed inferobasal defect, but no ischemia  . TRANSTHORACIC ECHOCARDIOGRAM  07/2013   EF 55-60%, mild LVH; mild MR; LA mildly dilated; RVSP increased; PA peak pressure 14mmHg (mod pulm HTN)    FAMHx:  Family History  Problem Relation Age of  Onset  . Heart disease Father   . CAD Brother        CABG  . CAD Brother        CABG  . Colon cancer Neg Hx     SOCHx:   reports that he has been smoking cigarettes. He has a 6.00 pack-year smoking history. He has never used smokeless tobacco. He reports current alcohol use of about 12.0 standard drinks of alcohol per week. He reports that he does not use drugs.  ALLERGIES:  No Known Allergies  ROS: Pertinent items noted in HPI and remainder of comprehensive ROS otherwise negative.  HOME MEDS: Current Outpatient Medications  Medication Sig Dispense Refill  . aspirin EC 81 MG tablet Take 81 mg by mouth daily.    Marland Kitchen esomeprazole (NEXIUM) 20 MG capsule Take 20 mg by mouth daily at 12 noon.    . ezetimibe (ZETIA) 10 MG tablet TAKE 1 TABLET(10 MG) BY MOUTH DAILY 90 tablet 1  . Icosapent Ethyl (VASCEPA) 1 g CAPS Take 2 g by mouth 2 (two) times daily. 120 capsule 6  . metoprolol succinate (TOPROL-XL) 50 MG 24 hr tablet TAKE 1 TABLET BY MOUTH EVERY DAY TAKE WITH OR  IMMEDIATELY FOLLOWING A MEAL. 90 tablet 3  . ramipril (ALTACE) 10 MG capsule TAKE 1 CAPSULE BY MOUTH DAILY 30 capsule 0  . rosuvastatin (CRESTOR) 5 MG tablet TAKE 1 TABLET(5 MG) BY MOUTH AT BEDTIME 90 tablet 2  . tamsulosin (FLOMAX) 0.4 MG CAPS capsule Take 0.4 mg by mouth daily.     No current facility-administered medications for this visit.    LABS/IMAGING: No results found for this or any previous visit (from the past 48 hour(s)). No results found.  VITALS: BP (!) 160/58 (BP Location: Left Arm, Patient Position: Sitting)   Pulse 79   Ht 5\' 8"  (1.727 m)   Wt 181 lb 3.2 oz (82.2 kg)   SpO2 96%   BMI 27.55 kg/m   EXAM: General appearance: alert and no distress Neck: no carotid bruit and no JVD Lungs: clear to auscultation bilaterally Heart: regular rate and rhythm, S1, S2 normal, no murmur, click, rub or gallop Abdomen: soft, non-tender; bowel sounds normal; no masses,  no organomegaly Extremities: cool to touch, diminished pulses no edema Pulses: 1+ Skin: pale, cool, dry Neurologic: Mental status: Alert, oriented, thought content appropriate Psych: Normal  EKG: Normal sinus rhythm 79-personally reviewed  ASSESSMENT: 1. Coronary artery disease status post single vessel CABG in 2009 (free right radial to PDA) 2. Numerous prior coronary interventions to the RCA and LAD with brachii therapy 3. PAD status post right common iliac and right superficial femoral artery stents in 2003 4. Hypertension 5. Dyslipidemia  PLAN: 1.   Mr. Rask has had some improvement in his lipids.  He denies any chest pain at this point.  He denies any claudication.  Dopplers were last performed 2 years ago but as he is asymptomatic, I think we can hold off on repeating them.  Blood pressure is elevated today however may be related to alcohol binge last evening.  You should monitor this at home.  His cholesterol has been at target.  Continue current medications.  Follow-up with his PCP and [place] annually  or sooner as necessary.  Pixie Casino, MD, [facility], [place] Director of the Advanced Lipid Disorders &  Cardiovascular Risk Reduction Clinic Attending Cardiologist  Direct Dial: [phone]  Fax: [phone]  Website:  [email or web address]  Nadean Corwin Westen Dinino 02/16/2021, 2:08 PM

## 2021-02-22 ENCOUNTER — Other Ambulatory Visit: Payer: Self-pay | Admitting: Internal Medicine

## 2021-03-21 ENCOUNTER — Telehealth: Payer: Self-pay | Admitting: Internal Medicine

## 2021-03-21 DIAGNOSIS — I2510 Atherosclerotic heart disease of native coronary artery without angina pectoris: Secondary | ICD-10-CM

## 2021-03-21 MED ORDER — EZETIMIBE 10 MG PO TABS: ORAL_TABLET | ORAL | 1 refills | Status: DC

## 2021-03-21 NOTE — Telephone Encounter (Signed)
Pt called and asked for the generic of the Zetia to be sent in to [facility] again.. I resent the RX.

## 2021-03-21 NOTE — Telephone Encounter (Signed)
Pt c/o medication issue:  1. Name of Medication: ezetimibe (ZETIA) 10 MG tablet  2. How are you currently taking this medication (dosage and times per day)? 1 tablet by mouth daily   3. Are you having a reaction (difficulty breathing--STAT)? No   4. What is your medication issue? Jonathan [name] is calling requesting to be switched to the generic version of this medication due to the cost of this one. Please advise.

## 2021-03-30 DIAGNOSIS — Z20822 Contact with and (suspected) exposure to covid-19: Secondary | ICD-10-CM | POA: Diagnosis not present

## 2021-06-09 ENCOUNTER — Telehealth: Payer: Self-pay | Admitting: Internal Medicine

## 2021-06-09 NOTE — Telephone Encounter (Signed)
PT is calling to change his pharmacy to [facility] at  [address] [place] [address] [phone]

## 2021-06-09 NOTE — Telephone Encounter (Signed)
Called patient left message on personal voice mail pharmacy changed to [facility] in [place].

## 2021-07-24 ENCOUNTER — Other Ambulatory Visit: Payer: Self-pay | Admitting: Internal Medicine

## 2021-07-24 DIAGNOSIS — I2510 Atherosclerotic heart disease of native coronary artery without angina pectoris: Secondary | ICD-10-CM

## 2021-11-15 ENCOUNTER — Other Ambulatory Visit: Payer: Self-pay | Admitting: Internal Medicine

## 2021-12-06 ENCOUNTER — Other Ambulatory Visit: Payer: Self-pay

## 2021-12-06 ENCOUNTER — Telehealth: Payer: Self-pay | Admitting: Internal Medicine

## 2021-12-06 DIAGNOSIS — I2510 Atherosclerotic heart disease of native coronary artery without angina pectoris: Secondary | ICD-10-CM

## 2021-12-06 MED ORDER — EZETIMIBE 10 MG PO TABS: ORAL_TABLET | ORAL | 1 refills | Status: DC

## 2021-12-06 NOTE — Telephone Encounter (Signed)
Spoke to patient and refill sent to pharmacy.

## 2021-12-06 NOTE — Telephone Encounter (Signed)
°*  STAT* If patient is at the pharmacy, call can be transferred to refill team.   1. Which medications need to be refilled? (please list name of each medication and dose if known)  ezetimibe (ZETIA) 10 MG tablet  2. Which pharmacy/location (including street and city if local pharmacy) is medication to be sent to? [facility] [phone] [address] [place]   3. Do they need a 30 day or 90 day supply? 90 day  Patient requests all future refills for his medications be sent to [facility]. He would like it updated in his preferred pharmacies.

## 2021-12-27 ENCOUNTER — Telehealth: Payer: Self-pay | Admitting: Internal Medicine

## 2021-12-27 DIAGNOSIS — I2510 Atherosclerotic heart disease of native coronary artery without angina pectoris: Secondary | ICD-10-CM

## 2021-12-27 MED ORDER — EZETIMIBE 10 MG PO TABS: ORAL_TABLET | ORAL | 0 refills | Status: DC

## 2021-12-27 NOTE — Telephone Encounter (Signed)
°*  STAT* If patient is at the pharmacy, call can be transferred to refill team.   1. Which medications need to be refilled? (please list name of each medication and dose if known) ezetimibe (ZETIA) 10 MG tablet  2. Which pharmacy/location (including street and city if local pharmacy) is medication to be sent to? [place], [place] - [address]  3. Do they need a 30 day or 90 day supply? Jonathan [name]

## 2021-12-27 NOTE — Telephone Encounter (Signed)
Refills has been sent to the pharmacy. 

## 2022-02-15 ENCOUNTER — Other Ambulatory Visit: Payer: Self-pay | Admitting: Internal Medicine

## 2022-04-21 DIAGNOSIS — Z6826 Body mass index (BMI) 26.0-26.9, adult: Secondary | ICD-10-CM | POA: Diagnosis not present

## 2022-04-21 DIAGNOSIS — I10 Essential (primary) hypertension: Secondary | ICD-10-CM | POA: Diagnosis not present

## 2022-04-21 DIAGNOSIS — J069 Acute upper respiratory infection, unspecified: Secondary | ICD-10-CM | POA: Diagnosis not present

## 2022-04-21 DIAGNOSIS — E663 Overweight: Secondary | ICD-10-CM | POA: Diagnosis not present

## 2022-07-13 ENCOUNTER — Other Ambulatory Visit: Payer: Self-pay | Admitting: Internal Medicine

## 2022-07-13 DIAGNOSIS — I2510 Atherosclerotic heart disease of native coronary artery without angina pectoris: Secondary | ICD-10-CM

## 2022-08-05 ENCOUNTER — Other Ambulatory Visit: Payer: Self-pay | Admitting: Internal Medicine

## 2022-08-07 ENCOUNTER — Other Ambulatory Visit: Payer: Self-pay | Admitting: Internal Medicine

## 2022-08-07 DIAGNOSIS — I2510 Atherosclerotic heart disease of native coronary artery without angina pectoris: Secondary | ICD-10-CM

## 2022-08-19 NOTE — Progress Notes (Unsigned)
Cardiology Office Note:    Date:  08/22/2022   ID:  Jonathan [name], DOB 1951-05-15, MRN [id number]  PCP:  Sharilyn Sites, MD   [facility] Providers Cardiologist:  Pixie Casino, MD     Referring MD: Sharilyn Sites, MD   Chief Complaint: follow up CAD  History of Present Illness:    Jonathan [name] is a very pleasant 71 y.o. male with a hx of CAD, PAD, HTN, GERD, and hyperlipidemia.   He was previously followed by Dr. Rollene Fare, followed by Dr. Debara Pickett. Extensive history of coronary artery disease with numerous previous stents to RCA and LAD and underwent brachii therapy to LAD.  Eventually developed occlusion of RCA and ultimately underwent a right free radial graft to the PDA by Dr. Cyndia Bent in 2009.  History of PAD with right common iliac and femoral artery stenting in 2003. Evaluation of PAD with stable dopplers 2020, no c/o claudication. NST with no evidence of ischemia, hypertensive spots to exercise, normal LV in 2014.  He was last seen in our office on 02/16/2021 by Dr. Debara Pickett.  BP was elevated but felt it was 2/2 higher than normal alcohol intake the night before.  He was advised to continue to monitor and return in 1 year for follow-up.  Today, he is here alone for follow-up. Reports chest pain with bilateral arm radiation that occurs 1-2 times per week.  Is active doing yard work, Nurse, children's. States pain is better when he is active. Feels vaguely similar to previous angina. Chews several aspirin and symptoms resolve. Does not have any NTG. He denies SOB, diaphoresis, or n/v. Drinks 6 pack of beer on occasion but not daily and smokes 2 packs of cigarettes per week.  He denies palpitations, presyncope, syncope, orthopnea, PND.  Past Medical History:  Diagnosis Date   Anemia    CAD (coronary artery disease)    Femoral artery stenosis (HCC)    Gastroesophageal reflux    H/O hiatal hernia    Hyperlipidemia    Hypertension    Myocardial infarction [facility]) 11/2001    PAD (peripheral artery disease) ([place])    bilateral iliac artery stents    PONV (postoperative nausea and vomiting)    most of the time he gets nauseated   Prostate hyperplasia with urinary obstruction    S/P CABG x 1 2009   R radial to PDA   Tobacco consumption    quit cigarettes in 2008, smokes 5-6 cigars/week    Past Surgical History:  Procedure Laterality Date   ANTERIOR CERVICAL DECOMP/DISCECTOMY FUSION N/A 10/22/2014   Procedure: CERVICAL SIX SEVEN ANTERIOR CERVICAL DECOMPRESSION/DISCECTOMY FUSION  ;  Surgeon: Ashok Pall, MD;  Location: [place] [facility] ORS;  Service: Neurosurgery;  Laterality: N/A;  C67 Possible C7T1 anterior cervical decompression with fusion plating and bonegraft   BACK SURGERY     CARDIAC CATHETERIZATION  12/03/2001   2.5x71m S-660 stent to RCA (MI)   CARDIAC CATHETERIZATION  05/06/2002   PTCA & stenting of LAD and PTCA & stenting of mid/distal RCA with 3.0x132mZeta stent   CARDIAC CATHETERIZATION  06/03/2002   LAD with prox 30% in-stent restenosis; ramus with 95% ostial lesion; L Cfx & 3-OMs free of disease; mild 30% RCA narrowing & 20-30% in-stent restenosis in RCA (Dr. R.Gerrie Nordmann  CASunshine3/30/2004   overlapping tandem DES 3.0x23 Cypher stent to mid RCA for in-stent restenosis & new lesion beyond previous stent with 3.0x12 SciMed TAXUS (Dr. R.Marella Chimes  CARDIAC CATHETERIZATION  12/08/2003   3.0x24 TAXUS overlapping 3.0x27m TAXUS DES in RCA and cuttin balloon atherectomy (Dr. RMarella Chimes   CARDIAC CATHETERIZATION  07/10/2007   normal L main, L Cfx & 3-OMs free of disease; LAD with Zeta stent in prox region (patent); RCA with multiple stents (3.0x12 Taxus in ostium with 3.0x20 Taxus overlapping it, and series of 3.0x24 Tacus and Cypher stent overlapping this, and in distal RC 2x5x210mZeta(?) stent; ostium of RCA with 80% narrowing - 3x15 cutting balloon - 80% to patient w/NO obstruction (Dr. A.Loni MuseLittle)   CAStar[facility]rick6/15/2009    high-grade osital & prox in-stent RCA restenosis - Dr. R.Marella Chimes COLONOSCOPY N/A 11/22/2015   Procedure: COLONOSCOPY;  Surgeon: RoDaneil DolinMD;  [facility];  Service: Endoscopy;  Laterality: N/A;  100   CORONARY ARTERY BYPASS GRAFT  2009   free right radial to PDA for progressive RCA disease & multiple re-stenosis (Dr. B.Ellison Hughs  ILIAC ARTERY STENT Bilateral 01/30/2002   9x4037mordis SmartStent to LCIA, 9x3.68m68mnesis stent to RCIA (Dr. J. BAdora FridgeLower Extremity Arterial Doppler  2009   R CIA stent - less than 50% diameter reduction; R CFA stent - 0-49% diameter reduction; R SFA less than 50% diameter reduction; L CFA, profunda, SFA - equal/less than 50% diameter reduction   LUMBAR LAMINECTOMY/DECOMPRESSION MICRODISCECTOMY Bilateral 03/18/2015   Procedure: LUMBAR LAMINECTOMY/DECOMPRESSION MICRODISCECTOMY 2 LEVELS;  Surgeon: KyleAshok Pall;  [facility] [place];  Service: Neurosurgery;  Laterality: Bilateral;  Bilateral L34 L45 laminectomies   NM MYOCAR PERF WALL MOTION  07/2013   bruce myoview - intermediate risk test with abnormal inferior horizontal ST depression; mild, fixed inferobasal defect, but no ischemia   TRANSTHORACIC ECHOCARDIOGRAM  07/2013   EF 55-60%, mild LVH; mild MR; LA mildly dilated; RVSP increased; PA peak pressure 35mm55mmod pulm HTN)    Current Medications: Current Meds  Medication Sig   aspirin EC 81 MG tablet Take 81 mg by mouth daily.   esomeprazole (NEXIUM) 20 MG capsule Take 20 mg by mouth daily at 12 noon.   ezetimibe (ZETIA) 10 MG tablet Take 1 tablet by mouth daily. Please make appointment for further refills.   isosorbide mononitrate (IMDUR) 30 MG 24 hr tablet Take 1 tablet (30 mg total) by mouth daily.   metoprolol succinate (TOPROL-XL) 50 MG 24 hr tablet TAKE 1 TABLET BY MOUTH EVERY DAY WITH OR IMMEDIATELY FOLLOWING A MEAL.   nitroGLYCERIN (NITROSTAT) 0.4 MG SL tablet Place 1 tablet (0.4 mg total) under the tongue every 5 (five)  minutes as needed for chest pain.   ramipril (ALTACE) 10 MG capsule TAKE 1 CAPSULE BY MOUTH DAILY   rosuvastatin (CRESTOR) 5 MG tablet TAKE 1 TABLET(5 MG) BY MOUTH AT BEDTIME   tamsulosin (FLOMAX) 0.4 MG CAPS capsule Take 0.4 mg by mouth daily.     Allergies:   Patient has no known allergies.   Social History   Socioeconomic History   Marital status: Married    Spouse name: Not on file   Number of children: 2   Years of education: Not on file   Highest education level: Not on file  Occupational History   Occupation: disability  Tobacco Use   Smoking status: Some Days    Packs/day: 0.20    Years: 30.00    Total pack years: 6.00    Types: Cigarettes   Smokeless tobacco: Never   Tobacco comments:  1 pk per week - cigarettes  Substance and Sexual Activity   Alcohol use: Yes    Alcohol/week: 12.0 standard drinks of alcohol    Types: 12 Cans of beer per week    Comment: beer 2-3 times per week   Drug use: No    Types: Marijuana   Sexual activity: Not on file  Other Topics Concern   Not on file  Social History Narrative   Not on file   Social Determinants of Health   Financial Resource Strain: Not on file  Food Insecurity: Not on file  Transportation Needs: Not on file  Physical Activity: Not on file  Stress: Not on file  Social Connections: Not on file     Family History: The patient's family history includes CAD in his brother and brother; Heart disease in his father. There is no history of Colon cancer.  ROS:   Please see the history of present illness.    + chest pain All other systems reviewed and are negative.  Labs/Other Studies Reviewed:    The following studies were reviewed today:  VAS US Aorta/IVC Iliacs 08/12/2019  No evidence of an abdominal aortic aneurysm was visualized.  Atherosclerosis in the aorta and iliac arteries.  50-99% stenosis in the right common iliac stent.  >50% stenosis in the right external iliac artery.    *See table(s)  above for measurements and observations.  Suggest follow up study in 12 months.   LE Arterial 08/12/2019  Right: Mild progression is noted compared to previous study.  Atherosclerosis in the common femoral, femoral and popliteal arteries.  30-49% stenosis in the common femoral artery; s/p stent. Unable to  visualize stent struts.  30-49% stenosis in the superficial femoral artery.  30-49% stenosis in the above knee popliteal artery.   Left: Mild progression is noted compared to previous study.  Atherosclerosis in the common femoral, femoral and popliteal arteries.  30-49% stenosis in the common femoral artery.  50-74% stenosis in the distal common femoral artery.  50-74% stenosis in the proximal superficial femoral artery.    See table(s) above for measurements and observations.   Suggest follow up study in 12 months.   Nuclear stress test 08/19/2013  Intermediate risk test with abnormal inferior horizontal ST depression of 3 mm at peak stress, elective chronically diagnostic for ischemia.  Nuclear images suggest a mild fixed inferior basal defect with underlying bowel, suggesting attenuation, but no ischemia.  Hypertensive response to exercise.  LV function normal.  Recent Labs: No results found for requested labs within last 365 days.  Recent Lipid Panel    Component Value Date/Time   CHOL 108 08/12/2019 0918   TRIG 172 (H) 08/12/2019 0918   HDL 36 (L) 08/12/2019 0918   CHOLHDL 3.0 08/12/2019 0918   CHOLHDL 4.9 11/21/2016 0925   VLDL 61 (H) 11/21/2016 0925   LDLCALC 38 08/12/2019 0918     Risk Assessment/Calculations:       Physical Exam:    VS:  BP (!) 140/68   Pulse 75   Ht 5' 8" (1.727 m)   Wt 180 lb 6.4 oz (81.8 kg)   SpO2 97%   BMI 27.43 kg/m     Wt Readings from Last 3 Encounters:  08/22/22 180 lb 6.4 oz (81.8 kg)  02/16/21 181 lb 3.2 oz (82.2 kg)  02/19/20 180 lb 3.2 oz (81.7 kg)     GEN:  Well nourished, well developed in no acute distress HEENT:  Normal NECK: No JVD;  No carotid bruits CARDIAC: RRR, no murmurs, rubs, gallops RESPIRATORY:  Diminished breath sounds bilaterally without rales, wheezing or rhonchi  ABDOMEN: Soft, non-tender, non-distended MUSCULOSKELETAL:  No edema; No deformity. 2+ pedal pulses, equal bilaterally SKIN: Warm and dry NEUROLOGIC:  Alert and oriented x 3 PSYCHIATRIC:  Normal affect   EKG:  EKG is ordered today.  The ekg ordered today demonstrates NSR at 75 bpm, no acute ST abnormality  HYPERTENSION CONTROL Vitals:   08/22/22 1455 08/22/22 1537  BP: (!) 158/72 (!) 140/68    The patient's blood pressure is elevated above target today.  In order to address the patient's elevated BP: A new medication was prescribed today.     Diagnoses:    1. Coronary artery disease involving native coronary artery of native heart with unstable angina pectoris ([place])   2. Hyperlipidemia, unspecified hyperlipidemia type   3. Essential hypertension   4. Chest pain of uncertain etiology   5. Tobacco abuse   6. PAD (peripheral artery disease) (HCC)    Assessment and Plan:     CAD with unstable angina: Extensive remote history of CAD with remote multiple stents and bypass x 1 2009.  He reports increasing episodes of chest discomfort that radiates down both arms.  Somewhat atypical in that pain improves when he is active, however he describes similarity to previous angina. No associated SOB, n/v, diaphoresis.  We will get [facility] for evaluation of ischemia.  Will refill Rx for SL NTG. I advised him on proper use of NTG. Adding Imdur 30 mg once daily.   PAD: Stable on imaging 07/2019.  He denies significant symptoms of claudication today.  Advised he contact [facility] for follow-up.  Hypertension: BP is elevated today.  He does not monitor on a consistent basis.  We are adding isosorbide 30 mg daily as noted above which will likely improve BP.   Hyperlipidemia LDL goal < 70: No recent lab work to review.  We will check  fasting lipid and complete metabolic panel today.  Will advise on statin therapy based on results.  Tobacco abuse: Complete cessation advised.  Shared Decision Making/Informed Consent The risks [chest pain, shortness of breath, cardiac arrhythmias, dizziness, blood pressure fluctuations, myocardial infarction, stroke/transient ischemic attack, nausea, vomiting, allergic reaction, radiation exposure, metallic taste sensation and life-threatening complications (estimated to be 1 in 10,000)], benefits (risk stratification, diagnosing coronary artery disease, treatment guidance) and alternatives of a nuclear stress test were discussed in detail with Jonathan [name] and he agrees to proceed.   Disposition: 3-4 months with Dr. Debara Pickett  Medication Adjustments/Labs and Tests Ordered: Current medicines are reviewed at length with the patient today.  Concerns regarding medicines are outlined above.  Orders Placed This Encounter  Procedures   Lipid Profile   Comp Met (CMET)   Cardiac Stress Test: Informed Consent Details: Physician/Practitioner Attestation; Transcribe to consent form and obtain patient signature   Myocardial Perfusion Imaging   EKG 12-Lead   Meds ordered this encounter  Medications   isosorbide mononitrate (IMDUR) 30 MG 24 hr tablet    Sig: Take 1 tablet (30 mg total) by mouth daily.    Dispense:  90 tablet    Refill:  3   nitroGLYCERIN (NITROSTAT) 0.4 MG SL tablet    Sig: Place 1 tablet (0.4 mg total) under the tongue every 5 (five) minutes as needed for chest pain.    Dispense:  25 tablet    Refill:  3    Patient Instructions  Medication Instructions:  START Imdur one (1 tablet) by mouth ( 30 mg ) daily.   START Nitro one (1) tablet as needed sublingual ( under tongue) for chest pain.   If a single episode of chest pain is not relieved by one tablet, the patient will try another within 5 minutes; and if this doesn't relieve the pain, the patient will try another within 5 minutes  and if this doesn't relieve the pain the patient is instructed to call 911 for transportation to an emergency department.   *If you need a refill on your cardiac medications before your next appointment, please call your pharmacy*   Lab Work:  TODAY!!!! LIPID/CMET  If you have labs (blood work) drawn today and your tests are completely normal, you will receive your results only by: [place] (if you have MyChart) OR A paper copy in the mail If you have any lab test that is abnormal or we need to change your treatment, we will call you to review the results.   Testing/Procedures:  You are scheduled for a Myocardial Perfusion Imaging Study on Wednesday, September 6 at 8:00 am.   Please arrive 15 minutes prior to your appointment time for registration and insurance purposes.   The test will take approximately 3 to 4 hours to complete; you may bring reading material. If someone comes with you to your appointment, they will need to remain in the main lobby due to limited space in the testing area.   How to prepare for your Myocardial Perfusion test:   Do not eat or drink 3 hours prior to your test, except you may have water.    Do not consume products containing caffeine (regular or decaffeinated) 12 hours prior to your test (ex: coffee, chocolate, soda, tea)   Do bring a list of your current medications with you. If not listed below, you may take your medications as normal.   Bring any held medication to your appointment, as you may be required to take it once the test is complete.   Do wear comfortable clothes (no  overalls) and walking shoes. Tennis shoes are preferred. No open toed shoes.  Do not wear cologne, aftershave or lotions (deodorant is allowed).   If these instructions are not followed, you test will have to be rescheduled.   Please report to [address] for your test. If you have questions or concerns about your appointment, please call  the Nuclear Lab at [phone].  If you cannot keep your appointment, please provide 24 hour notification to the Nuclear lab to avoid a possible $50 charge to your account.       Follow-Up: At [facility], you and your health needs are our priority.  As part of our continuing mission to provide you with exceptional heart care, we have created designated Provider Care Teams.  These Care Teams include your primary Cardiologist (physician) and Advanced Practice Providers (APPs -  Physician Assistants and Nurse Practitioners) who all work together to provide you with the care you need, when you need it.  We recommend signing up for the patient portal called "MyChart".  Sign up information is provided on this After Visit Summary.  MyChart is used to connect with patients for Virtual Visits (Telemedicine).  Patients are able to view lab/test results, encounter notes, upcoming appointments, etc.  Non-urgent messages can be sent to your provider as well.   To learn more about what you can do with MyChart, go to [email or web address].  Your next appointment:   4 month(s)  The format for your next appointment:   In Person  Provider:   Pixie Casino, MD     Important Information About Sugar         Signed, Emmaline Life, NP  08/22/2022 6:49 PM    [place]

## 2022-08-22 ENCOUNTER — Ambulatory Visit: Payer: Medicare HMO | Admitting: Nurse Practitioner

## 2022-08-22 ENCOUNTER — Encounter: Payer: Self-pay | Admitting: Nurse Practitioner

## 2022-08-22 ENCOUNTER — Ambulatory Visit: Payer: Medicare HMO | Attending: Nurse Practitioner | Admitting: Nurse Practitioner

## 2022-08-22 VITALS — BP 140/68 | HR 75 | Ht 68.0 in | Wt 180.4 lb

## 2022-08-22 DIAGNOSIS — R079 Chest pain, unspecified: Secondary | ICD-10-CM

## 2022-08-22 DIAGNOSIS — I10 Essential (primary) hypertension: Secondary | ICD-10-CM

## 2022-08-22 DIAGNOSIS — I739 Peripheral vascular disease, unspecified: Secondary | ICD-10-CM | POA: Diagnosis not present

## 2022-08-22 DIAGNOSIS — E785 Hyperlipidemia, unspecified: Secondary | ICD-10-CM

## 2022-08-22 DIAGNOSIS — Z720 Tobacco use: Secondary | ICD-10-CM

## 2022-08-22 DIAGNOSIS — I2510 Atherosclerotic heart disease of native coronary artery without angina pectoris: Secondary | ICD-10-CM | POA: Diagnosis not present

## 2022-08-22 DIAGNOSIS — I25110 Atherosclerotic heart disease of native coronary artery with unstable angina pectoris: Secondary | ICD-10-CM | POA: Diagnosis not present

## 2022-08-22 MED ORDER — ISOSORBIDE MONONITRATE ER 30 MG PO TB24: 30.0000 mg | ORAL_TABLET | Freq: Every day | ORAL | 3 refills | Status: DC

## 2022-08-22 MED ORDER — NITROGLYCERIN 0.4 MG SL SUBL: 0.4000 mg | SUBLINGUAL_TABLET | SUBLINGUAL | 3 refills | Status: DC | PRN

## 2022-08-22 NOTE — Patient Instructions (Signed)
Medication Instructions:   START Imdur one (1 tablet) by mouth ( 30 mg ) daily.   START Nitro one (1) tablet as needed sublingual ( under tongue) for chest pain.   If a single episode of chest pain is not relieved by one tablet, the patient will try another within 5 minutes; and if this doesn't relieve the pain, the patient will try another within 5 minutes and if this doesn't relieve the pain the patient is instructed to call 911 for transportation to an emergency department.   *If you need a refill on your cardiac medications before your next appointment, please call your pharmacy*   Lab Work:  TODAY!!!! LIPID/CMET  If you have labs (blood work) drawn today and your tests are completely normal, you will receive your results only by: [place] (if you have MyChart) OR A paper copy in the mail If you have any lab test that is abnormal or we need to change your treatment, we will call you to review the results.   Testing/Procedures:  You are scheduled for a Myocardial Perfusion Imaging Study on Wednesday, September 6 at 8:00 am.   Please arrive 15 minutes prior to your appointment time for registration and insurance purposes.   The test will take approximately 3 to 4 hours to complete; you may bring reading material. If someone comes with you to your appointment, they will need to remain in the main lobby due to limited space in the testing area.   How to prepare for your Myocardial Perfusion test:   Do not eat or drink 3 hours prior to your test, except you may have water.    Do not consume products containing caffeine (regular or decaffeinated) 12 hours prior to your test (ex: coffee, chocolate, soda, tea)   Do bring a list of your current medications with you. If not listed below, you may take your medications as normal.   Bring any held medication to your appointment, as you may be required to take it once the test is complete.   Do wear comfortable clothes (no   overalls) and walking shoes. Tennis shoes are preferred. No open toed shoes.  Do not wear cologne, aftershave or lotions (deodorant is allowed).   If these instructions are not followed, you test will have to be rescheduled.   Please report to [address] for your test. If you have questions or concerns about your appointment, please call the Nuclear Lab at [phone].  If you cannot keep your appointment, please provide 24 hour notification to the Nuclear lab to avoid a possible $50 charge to your account.       Follow-Up: At [facility], you and your health needs are our priority.  As part of our continuing mission to provide you with exceptional heart care, we have created designated Provider Care Teams.  These Care Teams include your primary Cardiologist (physician) and Advanced Practice Providers (APPs -  Physician Assistants and Nurse Practitioners) who all work together to provide you with the care you need, when you need it.  We recommend signing up for the patient portal called "MyChart".  Sign up information is provided on this After Visit Summary.  MyChart is used to connect with patients for Virtual Visits (Telemedicine).  Patients are able to view lab/test results, encounter notes, upcoming appointments, etc.  Non-urgent messages can be sent to your provider as well.   To learn more about what you can do with MyChart, go  to [email or web address].    Your next appointment:   4 month(s)  The format for your next appointment:   In Person  Provider:   Pixie Casino, MD     Important Information About Sugar

## 2022-08-23 ENCOUNTER — Telehealth (HOSPITAL_COMMUNITY): Payer: Self-pay | Admitting: *Deleted

## 2022-08-23 LAB — LIPID PANEL
Chol/HDL Ratio: 4.2 ratio (ref 0.0–5.0)
Cholesterol, Total: 143 mg/dL (ref 100–199)
HDL: 34 mg/dL — ABNORMAL LOW (ref >39)
LDL Chol Calc (NIH): 47 mg/dL (ref 0–99)
Triglycerides: 421 mg/dL — ABNORMAL HIGH (ref 0–149)
VLDL Cholesterol Cal: 62 mg/dL — ABNORMAL HIGH (ref 5–40)

## 2022-08-23 LAB — COMPREHENSIVE METABOLIC PANEL
ALT: 21 IU/L (ref 0–44)
AST: 18 IU/L (ref 0–40)
Albumin/Globulin Ratio: 1.9 (ref 1.2–2.2)
Albumin: 4.4 g/dL (ref 3.8–4.8)
Alkaline Phosphatase: 77 IU/L (ref 44–121)
BUN/Creatinine Ratio: 15 (ref 10–24)
BUN: 14 mg/dL (ref 8–27)
Bilirubin Total: 0.5 mg/dL (ref 0.0–1.2)
CO2: 23 mmol/L (ref 20–29)
Calcium: 9.5 mg/dL (ref 8.6–10.2)
Chloride: 98 mmol/L (ref 96–106)
Creatinine, Ser: 0.95 mg/dL (ref 0.76–1.27)
Globulin, Total: 2.3 g/dL (ref 1.5–4.5)
Glucose: 96 mg/dL (ref 70–99)
Potassium: 4.4 mmol/L (ref 3.5–5.2)
Sodium: 135 mmol/L (ref 134–144)
Total Protein: 6.7 g/dL (ref 6.0–8.5)
eGFR: 86 mL/min/{1.73_m2} (ref >59)

## 2022-08-23 NOTE — Telephone Encounter (Signed)
Left message on voicemail in reference to upcoming appointment scheduled for  08/30/22 Phone number given for a call back so details instructions can be given. Kirstie Peri

## 2022-08-29 ENCOUNTER — Other Ambulatory Visit: Payer: Self-pay | Admitting: *Deleted

## 2022-08-29 MED ORDER — FENOFIBRATE 145 MG PO TABS: 145.0000 mg | ORAL_TABLET | Freq: Every day | ORAL | 3 refills | Status: DC

## 2022-08-30 ENCOUNTER — Ambulatory Visit (HOSPITAL_COMMUNITY): Payer: Medicare HMO | Attending: Cardiovascular Disease

## 2022-08-30 DIAGNOSIS — E785 Hyperlipidemia, unspecified: Secondary | ICD-10-CM

## 2022-08-30 DIAGNOSIS — I10 Essential (primary) hypertension: Secondary | ICD-10-CM

## 2022-08-30 DIAGNOSIS — I25110 Atherosclerotic heart disease of native coronary artery with unstable angina pectoris: Secondary | ICD-10-CM

## 2022-08-30 DIAGNOSIS — R079 Chest pain, unspecified: Secondary | ICD-10-CM

## 2022-08-30 LAB — MYOCARDIAL PERFUSION IMAGING
LV dias vol: 90 mL (ref 62–150)
LV sys vol: 26 mL
Nuc Stress EF: 71 %
Peak HR: 88 {beats}/min
Rest HR: 62 {beats}/min
Rest Nuclear Isotope Dose: 10.2 mCi
SDS: 1
SRS: 6
SSS: 7
ST Depression (mm): 0 mm
Stress Nuclear Isotope Dose: 30.4 mCi
TID: 0.94

## 2022-08-30 MED ORDER — TECHNETIUM TC 99M TETROFOSMIN IV KIT
30.4000 | PACK | Freq: Once | INTRAVENOUS | Status: AC | PRN
  Administered 2022-08-30: 30.4 via INTRAVENOUS

## 2022-08-30 MED ORDER — REGADENOSON 0.4 MG/5ML IV SOLN
0.4000 mg | Freq: Once | INTRAVENOUS | Status: AC
  Administered 2022-08-30: 0.4 mg via INTRAVENOUS

## 2022-08-30 MED ORDER — TECHNETIUM TC 99M TETROFOSMIN IV KIT
10.2000 | PACK | Freq: Once | INTRAVENOUS | Status: AC | PRN
  Administered 2022-08-30: 10.2 via INTRAVENOUS

## 2022-09-05 ENCOUNTER — Other Ambulatory Visit: Payer: Self-pay | Admitting: Internal Medicine

## 2022-09-05 DIAGNOSIS — I2510 Atherosclerotic heart disease of native coronary artery without angina pectoris: Secondary | ICD-10-CM

## 2022-09-06 NOTE — Progress Notes (Signed)
Cardiology Office Note:    Date:  09/07/2022   ID:  Jonathan [name], DOB 30-Sep-1951, MRN [id number]  PCP:  Sharilyn Sites, MD   [facility] Providers Cardiologist:  Pixie Casino, MD     Referring MD: Sharilyn Sites, MD   Chief Complaint: follow up CAD  History of Present Illness:    Jonathan [name] is a very pleasant 71 y.o. male with a hx of CAD, PAD, HTN, GERD, and hyperlipidemia.   He was previously followed by Dr. Rollene Fare, now followed by Dr. Debara Pickett. Extensive history of coronary artery disease with numerous previous stents to RCA and LAD and underwent brachii therapy to LAD.  Eventually developed occlusion of RCA and ultimately underwent a right free radial graft to the PDA by Dr. Cyndia Bent in 2009.  History of PAD with right common iliac and femoral artery stenting in 2003. Evaluation of PAD with stable dopplers 2020, no c/o claudication. NST with no evidence of ischemia, hypertensive spots to exercise, normal LV in 2014.  He was last seen in our office on 02/16/2021 by Dr. Debara Pickett.  BP was elevated but felt it was 2/2 higher than normal alcohol intake the night before.  He was advised to continue to monitor and return in 1 year for follow-up.  I saw him on 08/22/22 for regular follow-up. He reported chest pain with bilateral arm radiation occurring 1-2 times per week. Active doing yard work, Nurse, children's. States pain is better when he is active. Feels vaguely similar to previous angina. Chews several aspirin and symptoms resolve. Does not have any NTG. He denied SOB, diaphoresis, or n/v. Drinks 6 pack of beer on occasion but not daily and smokes 2 packs of cigarettes per week.  He denied palpitations, presyncope, syncope, orthopnea, PND.  Nuclear stress test 9/6 with large reversible basal to apical inferior perfusion defect, which partially improves with stress upright imaging, suggestive of a component of diaphragmatic attenuation artifact, cannot exclude reversible  ischemia. LVEF 71%, intermediate risk study. Consider further ischemic evaluation.  Today, he reports he is having increased symptoms consistent with previous angina. When he took the trash can out yesterday, he felt weak. Typical angina is hurting in chest with radiation to arms, having those symptoms a few times per week. Angina has improved since starting Imdur. Also feels discomfort lying flat, feels better turning on his side. He denies dyspnea on exertion but does not exercise on a consistent basis. He denies lower extremity edema, fatigue, palpitations, melena, hematuria, hemoptysis, diaphoresis, presyncope, syncope, and PND.  Past Medical History:  Diagnosis Date   Anemia    CAD (coronary artery disease)    Femoral artery stenosis (HCC)    Gastroesophageal reflux    H/O hiatal hernia    Hyperlipidemia    Hypertension    Myocardial infarction [facility]) 11/2001   PAD (peripheral artery disease) ([place])    bilateral iliac artery stents    PONV (postoperative nausea and vomiting)    most of the time he gets nauseated   Prostate hyperplasia with urinary obstruction    S/P CABG x 1 2009   R radial to PDA   Tobacco consumption    quit cigarettes in 2008, smokes 5-6 cigars/week    Past Surgical History:  Procedure Laterality Date   ANTERIOR CERVICAL DECOMP/DISCECTOMY FUSION N/A 10/22/2014   Procedure: CERVICAL SIX SEVEN ANTERIOR CERVICAL DECOMPRESSION/DISCECTOMY FUSION  ;  Surgeon: Ashok Pall, MD;  Location: [place] NEURO ORS;  Service: Neurosurgery;  Laterality:  N/A;  C67 Possible C7T1 anterior cervical decompression with fusion plating and bonegraft   BACK SURGERY     CARDIAC CATHETERIZATION  12/03/2001   2.5x18m S-660 stent to RCA (MI)   CARDIAC CATHETERIZATION  05/06/2002   PTCA & stenting of LAD and PTCA & stenting of mid/distal RCA with 3.0x166mZeta stent   CARDIAC CATHETERIZATION  06/03/2002   LAD with prox 30% in-stent restenosis; ramus with 95% ostial lesion; L Cfx & 3-OMs free of  disease; mild 30% RCA narrowing & 20-30% in-stent restenosis in RCA (Dr. R.Gerrie Nordmann  [place]3/30/2004   overlapping tandem DES 3.0x23 Cypher stent to mid RCA for in-stent restenosis & new lesion beyond previous stent with 3.0x12 [employer] TAXUS (Dr. R.Marella Chimes  [place]12/14/2004   3.0x24 TAXUS overlapping 3.0x2040mAXUS DES in RCA and cuttin balloon atherectomy (Dr. R. Marella Chimes CARDIAC CATHETERIZATION  07/10/2007   normal L main, L Cfx & 3-OMs free of disease; LAD with Zeta stent in prox region (patent); RCA with multiple stents (3.0x12 Taxus in ostium with 3.0x20 Taxus overlapping it, and series of 3.0x24 [place] and Cypher stent overlapping this, and in distal RC 2x5x20m51mta(?) stent; ostium of RCA with 80% narrowing - 3x15 cutting balloon - 80% to patient w/NO obstruction (Dr. A. LLoni Musettle)   [place]ARD[place]15/2009   high-grade osital & prox in-stent RCA restenosis - Dr. R. WMarella ChimesOLONOSCOPY N/A 11/22/2015   Procedure: COLONOSCOPY;  Surgeon: RobeDaneil Dolin;  [facility];  Service: Endoscopy;  Laterality: N/A;  100   CORONARY ARTERY BYPASS GRAFT  2009   free right radial to PDA for progressive RCA disease & multiple re-stenosis (Dr. B. BEllison HughsILIAC ARTERY STENT Bilateral 01/30/2002   9x40mm6mdis SmartStent to LCIA, 9x3.39mm 38msis stent to RCIA (Dr. J. BerAdora Fridgewer Extremity Arterial Doppler  2009   R CIA stent - less than 50% diameter reduction; R CFA stent - 0-49% diameter reduction; R SFA less than 50% diameter reduction; L CFA, profunda, SFA - equal/less than 50% diameter reduction   LUMBAR LAMINECTOMY/DECOMPRESSION MICRODISCECTOMY Bilateral 03/18/2015   Procedure: LUMBAR LAMINECTOMY/DECOMPRESSION MICRODISCECTOMY 2 LEVELS;  Surgeon: Kyle CAshok Pall [facility] [place];  Service: Neurosurgery;  Laterality: Bilateral;  Bilateral L34 L45 laminectomies   NM MYOCAR PERF WALL MOTION  07/2013   bruce myoview -  intermediate risk test with abnormal inferior horizontal ST depression; mild, fixed inferobasal defect, but no ischemia   TRANSTHORACIC ECHOCARDIOGRAM  07/2013   EF 55-60%, mild LVH; mild MR; LA mildly dilated; RVSP increased; PA peak pressure 35mmHg72md pulm HTN)    Current Medications: Current Meds  Medication Sig   aspirin EC 81 MG tablet Take 81 mg by mouth daily.   esomeprazole (NEXIUM) 20 MG capsule Take 20 mg by mouth daily at 12 noon.   ezetimibe (ZETIA) 10 MG tablet Take 1 tablet by mouth daily. Please make appointment for further refills.   fenofibrate (TRICOR) 145 MG tablet Take 1 tablet (145 mg total) by mouth daily.   isosorbide mononitrate (IMDUR) 30 MG 24 hr tablet Take 1 tablet (30 mg total) by mouth daily.   metoprolol succinate (TOPROL-XL) 50 MG 24 hr tablet TAKE 1 TABLET BY MOUTH EVERY DAY WITH OR IMMEDIATELY FOLLOWING A MEAL.   nitroGLYCERIN (NITROSTAT) 0.4 MG SL tablet Place 1 tablet (0.4 mg total) under the tongue every 5 (five) minutes as needed for chest pain.  ramipril (ALTACE) 10 MG capsule TAKE 1 CAPSULE BY MOUTH DAILY   ranolazine (RANEXA) 500 MG 12 hr tablet Take 1 tablet (500 mg total) by mouth 2 (two) times daily.   rosuvastatin (CRESTOR) 5 MG tablet TAKE 1 TABLET(5 MG) BY MOUTH AT BEDTIME   tamsulosin (FLOMAX) 0.4 MG CAPS capsule Take 0.4 mg by mouth daily.     Allergies:   Patient has no known allergies.   Social History   Socioeconomic History   Marital status: Married    Spouse name: Not on file   Number of children: 2   Years of education: Not on file   Highest education level: Not on file  Occupational History   Occupation: disability  Tobacco Use   Smoking status: Former    Packs/day: 0.20    Years: 30.00    Total pack years: 6.00    Types: Cigarettes    Quit date: 08/23/2022    Years since quitting: 0.0   Smokeless tobacco: Never   Tobacco comments:    1 pk per week - cigarettes  Substance and Sexual Activity   Alcohol use: Yes     Alcohol/week: 12.0 standard drinks of alcohol    Types: 12 Cans of beer per week    Comment: beer 2-3 times per week   Drug use: No    Types: Marijuana   Sexual activity: Not on file  Other Topics Concern   Not on file  Social History Narrative   Not on file   Social Determinants of Health   Financial Resource Strain: Not on file  Food Insecurity: Not on file  Transportation Needs: Not on file  Physical Activity: Not on file  Stress: Not on file  Social Connections: Not on file     Family History: The patient's family history includes CAD in his brother and brother; Heart disease in his father. There is no history of Colon cancer.  ROS:   Please see the history of present illness.    + weakness + orthopnea All other systems reviewed and are negative.  Labs/Other Studies Reviewed:    The following studies were reviewed today:  Lexiscan Myoview 08/30/22    Findings are equivocal, suggestive of possible diaphragmatic attenuation, but also concerning for ischemia. The study is intermediate risk.   No ST deviation was noted.   LV perfusion is abnormal. Defect 1: There is a medium defect with moderate reduction in uptake present in the apical to basal inferior location(s) that is partially reversible. There is normal wall motion in the defect area. Consistent with artifact caused by subdiaphragmatic activity.   Left ventricular function is normal. Nuclear stress EF: 71 %. The left ventricular ejection fraction is hyperdynamic (>65%). End diastolic cavity size is normal. End systolic cavity size is normal.   Prior study available for comparison from 08/19/2013. No changes compared to prior study. No ischemia, bowel attenuation artifact, normal LVEF at 66%   Large, reversible basal to apical inferior perfusion defect, which partially improves with stress upright imaging, suggestive of a component of diaphragmatic attenuation artifact. Cannot exclude reversible ischemia. LVEF 71% with  normal wall motion. This is an intermediate risk study. Consider further ischemic evaluation if warranted.  VAS US Aorta/IVC Iliacs 08/12/2019  No evidence of an abdominal aortic aneurysm was visualized.  Atherosclerosis in the aorta and iliac arteries.  50-99% stenosis in the right common iliac stent.  >50% stenosis in the right external iliac artery.    *See table(s) above for measurements  and observations.  Suggest follow up study in 12 months.   LE Arterial 08/12/2019  Right: Mild progression is noted compared to previous study.  Atherosclerosis in the common femoral, femoral and popliteal arteries.  30-49% stenosis in the common femoral artery; s/p stent. Unable to  visualize stent struts.  30-49% stenosis in the superficial femoral artery.  30-49% stenosis in the above knee popliteal artery.   Left: Mild progression is noted compared to previous study.  Atherosclerosis in the common femoral, femoral and popliteal arteries.  30-49% stenosis in the common femoral artery.  50-74% stenosis in the distal common femoral artery.  50-74% stenosis in the proximal superficial femoral artery.    See table(s) above for measurements and observations.   Suggest follow up study in 12 months.   Nuclear stress test 08/19/2013  Intermediate risk test with abnormal inferior horizontal ST depression of 3 mm at peak stress, elective chronically diagnostic for ischemia.  Nuclear images suggest a mild fixed inferior basal defect with underlying bowel, suggesting attenuation, but no ischemia.  Hypertensive response to exercise.  LV function normal.  Recent Labs: 08/22/2022: ALT 21; BUN 14; Creatinine, Ser 0.95; Potassium 4.4; Sodium 135  Recent Lipid Panel    Component Value Date/Time   CHOL 143 08/22/2022 1601   TRIG 421 (H) 08/22/2022 1601   HDL 34 (L) 08/22/2022 1601   CHOLHDL 4.2 08/22/2022 1601   CHOLHDL 4.9 11/21/2016 0925   VLDL 61 (H) 11/21/2016 0925   LDLCALC 47 08/22/2022 1601      Risk Assessment/Calculations:       Physical Exam:    VS:  BP 132/74   Pulse 65   Ht '5\' 8"'$  (1.727 m)   Wt 178 lb 3.2 oz (80.8 kg)   SpO2 96%   BMI 27.10 kg/m     Wt Readings from Last 3 Encounters:  09/07/22 178 lb 3.2 oz (80.8 kg)  08/30/22 180 lb (81.6 kg)  08/22/22 180 lb 6.4 oz (81.8 kg)     GEN:  Well nourished, well developed in no acute distress HEENT: Normal NECK: No JVD; No carotid bruits CARDIAC: RRR, no murmurs, rubs, gallops RESPIRATORY:  Diminished breath sounds bilaterally without rales, wheezing or rhonchi  ABDOMEN: Soft, non-tender, non-distended MUSCULOSKELETAL:  No edema; No deformity. 2+ pedal pulses, equal bilaterally SKIN: Warm and dry NEUROLOGIC:  Alert and oriented x 3 PSYCHIATRIC:  Normal affect   EKG:  EKG today reveals NSR at 67 bpm, no ST abnormality  Diagnoses:    1. Coronary artery disease of native artery of native heart with stable angina pectoris ([place])   2. Hyperlipidemia LDL goal <70   3. Essential hypertension   4. Tobacco abuse   5. PAD (peripheral artery disease) (HCC)     Assessment and Plan:     CAD with unstable angina: Extensive remote history of CAD with remote multiple stents and bypass graft to PDA x 1 2009. Seen 08/22/22 with worsening symptoms of angina. Myoview 9/6 could not r/o worsening ischemia so I brought him back in for further discussion.Symptoms of chest discomfort radiating down both arms has improved since starting Imdur 8/29. One episode of weakness on 9/13 after taking trash cans out.  Discussed with Dr. Ali Lowe, DOD who advised we increase medical therapy prior to cardiac catheterization. QT is stable on EKG. LVEF normal on recent myoview. Through shared decision making with patient, agreement to add Ranexa 500 mg twice daily and if no improvement, we can discuss up titration of medication  or proceed to cardiac catheterization.   PAD: Stable on imaging 07/2019.  He denies significant symptoms of  claudication today. Overdue for follow-up with [facility].   Hypertension: BP is well-controlled today.    Hyperlipidemia LDL goal < 70: LDL 47 on 08/22/22. Continue ezetimibe, fenofibrate, rosuvastatin.   Tobacco abuse: Complete cessation advised.   Disposition: 3 months with Dr. Debara Pickett, sooner if clinically indicated  Medication Adjustments/Labs and Tests Ordered: Current medicines are reviewed at length with the patient today.  Concerns regarding medicines are outlined above.  Orders Placed This Encounter  Procedures   EKG 12-Lead   Meds ordered this encounter  Medications   ranolazine (RANEXA) 500 MG 12 hr tablet    Sig: Take 1 tablet (500 mg total) by mouth 2 (two) times daily.    Dispense:  60 tablet    Refill:  11    Patient Instructions  Medication Instructions:   START Ranexa one (1) tablet by mouth ( 500 mg) twice daily.   *If you need a refill on your cardiac medications before your next appointment, please call your pharmacy*   Lab Work:  None ordered.   If you have labs (blood work) drawn today and your tests are completely normal, you will receive your results only by: [place] (if you have MyChart) OR A paper copy in the mail If you have any lab test that is abnormal or we need to change your treatment, we will call you to review the results.   Testing/Procedures:  None ordered.  Follow-Up: At [facility], you and your health needs are our priority.  As part of our continuing mission to provide you with exceptional heart care, we have created designated Provider Care Teams.  These Care Teams include your primary Cardiologist (physician) and Advanced Practice Providers (APPs -  Physician Assistants and Nurse Practitioners) who all work together to provide you with the care you need, when you need it.  We recommend signing up for the patient portal called "MyChart".  Sign up information is provided on this After Visit Summary.  MyChart is used to  connect with patients for Virtual Visits (Telemedicine).  Patients are able to view lab/test results, encounter notes, upcoming appointments, etc.  Non-urgent messages can be sent to your provider as well.   To learn more about what you can do with MyChart, go to [email or web address].    Your next appointment:   3 month(s)  The format for your next appointment:   In Person  Provider:   Pixie Casino, MD     Other Instructions   Please call or send mychart message in two weeks to let us know how you are feeling and if you are having any symptoms.   If you are having symptoms and it is after hours please call the on call doctor or go to the ED.   Important Information About Sugar         Signed, Emmaline Life, NP  09/07/2022 10:33 AM    [place]

## 2022-09-07 ENCOUNTER — Ambulatory Visit: Payer: Medicare HMO | Attending: Nurse Practitioner | Admitting: Nurse Practitioner

## 2022-09-07 ENCOUNTER — Encounter: Payer: Self-pay | Admitting: Nurse Practitioner

## 2022-09-07 VITALS — BP 132/74 | HR 65 | Ht 68.0 in | Wt 178.2 lb

## 2022-09-07 DIAGNOSIS — I10 Essential (primary) hypertension: Secondary | ICD-10-CM | POA: Diagnosis not present

## 2022-09-07 DIAGNOSIS — Z720 Tobacco use: Secondary | ICD-10-CM

## 2022-09-07 DIAGNOSIS — I25118 Atherosclerotic heart disease of native coronary artery with other forms of angina pectoris: Secondary | ICD-10-CM | POA: Diagnosis not present

## 2022-09-07 DIAGNOSIS — I739 Peripheral vascular disease, unspecified: Secondary | ICD-10-CM

## 2022-09-07 DIAGNOSIS — E785 Hyperlipidemia, unspecified: Secondary | ICD-10-CM | POA: Diagnosis not present

## 2022-09-07 MED ORDER — RANOLAZINE ER 500 MG PO TB12: 500.0000 mg | ORAL_TABLET | Freq: Two times a day (BID) | ORAL | 11 refills | Status: DC

## 2022-09-07 NOTE — Patient Instructions (Signed)
Medication Instructions:   START Ranexa one (1) tablet by mouth ( 500 mg) twice daily.   *If you need a refill on your cardiac medications before your next appointment, please call your pharmacy*   Lab Work:  None ordered.   If you have labs (blood work) drawn today and your tests are completely normal, you will receive your results only by: [place] (if you have MyChart) OR A paper copy in the mail If you have any lab test that is abnormal or we need to change your treatment, we will call you to review the results.   Testing/Procedures:  None ordered.  Follow-Up: At [facility], you and your health needs are our priority.  As part of our continuing mission to provide you with exceptional heart care, we have created designated Provider Care Teams.  These Care Teams include your primary Cardiologist (physician) and Advanced Practice Providers (APPs -  Physician Assistants and Nurse Practitioners) who all work together to provide you with the care you need, when you need it.  We recommend signing up for the patient portal called "MyChart".  Sign up information is provided on this After Visit Summary.  MyChart is used to connect with patients for Virtual Visits (Telemedicine).  Patients are able to view lab/test results, encounter notes, upcoming appointments, etc.  Non-urgent messages can be sent to your provider as well.   To learn more about what you can do with MyChart, go to [email or web address].    Your next appointment:   3 month(s)  The format for your next appointment:   In Person  Provider:   Pixie Casino, MD     Other Instructions   Please call or send mychart message in two weeks to let us know how you are feeling and if you are having any symptoms.   If you are having symptoms and it is after hours please call the on call doctor or go to the ED.   Important Information About Sugar

## 2022-09-11 ENCOUNTER — Other Ambulatory Visit: Payer: Self-pay | Admitting: Internal Medicine

## 2022-09-11 DIAGNOSIS — I2510 Atherosclerotic heart disease of native coronary artery without angina pectoris: Secondary | ICD-10-CM

## 2022-09-21 ENCOUNTER — Telehealth: Payer: Self-pay | Admitting: Nurse Practitioner

## 2022-09-21 NOTE — Telephone Encounter (Signed)
Pt c/o of Chest Pain: STAT if CP now or developed within 24 hours  1. Are you having CP right now? Took nitroglycerin and it eased off.   2. Are you experiencing any other symptoms (ex. SOB, nausea, vomiting, sweating)? no  3. How long have you been experiencing CP? Chest pain started 10 mins ago  4. Is your CP continuous or coming and going? It was continuous.   5. Have you taken Nitroglycerin? Yes  ?

## 2022-09-21 NOTE — Telephone Encounter (Signed)
Spoke to pt. He is made aware since he took 1 NTG with relieve he does not have to go to the emergency department at this time. However if he starts to have chest pain again, he should sit down and relax, if the pain continues she should take addition NTG and get checked out if her has to take 3. He verbalized understanding.

## 2022-10-19 DIAGNOSIS — Z1331 Encounter for screening for depression: Secondary | ICD-10-CM | POA: Diagnosis not present

## 2022-10-19 DIAGNOSIS — I259 Chronic ischemic heart disease, unspecified: Secondary | ICD-10-CM | POA: Diagnosis not present

## 2022-10-19 DIAGNOSIS — I739 Peripheral vascular disease, unspecified: Secondary | ICD-10-CM | POA: Diagnosis not present

## 2022-10-19 DIAGNOSIS — Z125 Encounter for screening for malignant neoplasm of prostate: Secondary | ICD-10-CM | POA: Diagnosis not present

## 2022-10-19 DIAGNOSIS — R69 Illness, unspecified: Secondary | ICD-10-CM | POA: Diagnosis not present

## 2022-10-19 DIAGNOSIS — I2510 Atherosclerotic heart disease of native coronary artery without angina pectoris: Secondary | ICD-10-CM | POA: Diagnosis not present

## 2022-10-19 DIAGNOSIS — Z0001 Encounter for general adult medical examination with abnormal findings: Secondary | ICD-10-CM | POA: Diagnosis not present

## 2022-10-19 DIAGNOSIS — E663 Overweight: Secondary | ICD-10-CM | POA: Diagnosis not present

## 2022-10-19 DIAGNOSIS — I10 Essential (primary) hypertension: Secondary | ICD-10-CM | POA: Diagnosis not present

## 2022-10-19 DIAGNOSIS — Z6826 Body mass index (BMI) 26.0-26.9, adult: Secondary | ICD-10-CM | POA: Diagnosis not present

## 2022-10-19 DIAGNOSIS — Z23 Encounter for immunization: Secondary | ICD-10-CM | POA: Diagnosis not present

## 2022-10-24 ENCOUNTER — Other Ambulatory Visit (HOSPITAL_COMMUNITY): Payer: Self-pay | Admitting: Family Medicine

## 2022-10-24 DIAGNOSIS — R918 Other nonspecific abnormal finding of lung field: Secondary | ICD-10-CM

## 2022-11-03 ENCOUNTER — Telehealth: Payer: Self-pay | Admitting: Nurse Practitioner

## 2022-11-03 NOTE — Telephone Encounter (Signed)
Spoke with pt, follow up schedule Tuesday next week with dr Debara Pickett.

## 2022-11-03 NOTE — Telephone Encounter (Signed)
This would be best discussed with primary cardiologist, Dr. Debara Pickett.  Could we see if he could be seen any sooner than December 4?  Also, he can increase his Ranexa to 1000 mg twice daily.

## 2022-11-03 NOTE — Telephone Encounter (Signed)
Pt calling asking to speak to [place], NP about having a stent put in.

## 2022-11-03 NOTE — Telephone Encounter (Signed)
Pt called back regarding note received in Triage. Pt stated he is currently asymptomatic at time of call.    Pt stated he has intermittent symptoms the past few weeks.  He has taken his Nitroglycerin tablets, and his symptoms have gone away each time.  ( Not requiring an ER visit. )  Pt states over the past few months he has taken his Nitro on about 5 occasions.    Pt is ready to have a Heart Catheterization and stent placed.  Will forward his request to Christen Bame, NP and CMA.

## 2022-11-07 ENCOUNTER — Ambulatory Visit: Payer: Medicare HMO | Attending: Internal Medicine | Admitting: Internal Medicine

## 2022-11-07 ENCOUNTER — Other Ambulatory Visit: Payer: Self-pay | Admitting: Internal Medicine

## 2022-11-07 ENCOUNTER — Encounter: Payer: Self-pay | Admitting: Internal Medicine

## 2022-11-07 VITALS — BP 155/70 | HR 68 | Ht 67.0 in | Wt 178.0 lb

## 2022-11-07 DIAGNOSIS — I200 Unstable angina: Secondary | ICD-10-CM

## 2022-11-07 DIAGNOSIS — R079 Chest pain, unspecified: Secondary | ICD-10-CM | POA: Diagnosis not present

## 2022-11-07 DIAGNOSIS — E785 Hyperlipidemia, unspecified: Secondary | ICD-10-CM

## 2022-11-07 DIAGNOSIS — R9439 Abnormal result of other cardiovascular function study: Secondary | ICD-10-CM

## 2022-11-07 DIAGNOSIS — Z951 Presence of aortocoronary bypass graft: Secondary | ICD-10-CM | POA: Diagnosis not present

## 2022-11-07 DIAGNOSIS — I10 Essential (primary) hypertension: Secondary | ICD-10-CM | POA: Diagnosis not present

## 2022-11-07 LAB — CBC
Hematocrit: 38.4 % (ref 37.5–51.0)
Hemoglobin: 13.1 g/dL (ref 13.0–17.7)
MCH: 30.5 pg (ref 26.6–33.0)
MCHC: 34.1 g/dL (ref 31.5–35.7)
MCV: 90 fL (ref 79–97)
RBC: 4.29 x10E6/uL (ref 4.14–5.80)
RDW: 12.9 % (ref 11.6–15.4)
WBC: 6.7 10*3/uL (ref 3.4–10.8)

## 2022-11-07 LAB — BASIC METABOLIC PANEL
BUN/Creatinine Ratio: 13 (ref 10–24)
BUN: 14 mg/dL (ref 8–27)
CO2: 24 mmol/L (ref 20–29)
Calcium: 9.6 mg/dL (ref 8.6–10.2)
Chloride: 101 mmol/L (ref 96–106)
Creatinine, Ser: 1.1 mg/dL (ref 0.76–1.27)
Glucose: 102 mg/dL — ABNORMAL HIGH (ref 70–99)
Potassium: 5.2 mmol/L (ref 3.5–5.2)
Sodium: 135 mmol/L (ref 134–144)
eGFR: 72 mL/min/{1.73_m2} (ref >59)

## 2022-11-07 NOTE — Progress Notes (Signed)
OFFICE NOTE  Chief Complaint:  Chest pain, abnormal stress test  Primary Care Physician: Sharilyn Sites, MD  HPI:  Jonathan [name] is a pleasant 71 year old male patient who was formerly followed by Dr. Rollene Fare. He has an extensive past medical history of coronary disease. He has had numerous prior stents to the right coronary artery and LAD and underwent brachii therapy to the LAD. Eventually developed occlusion of the right coronary artery and ultimately underwent a right free radial graft to the PDA by Dr. Cyndia Bent in 2009. He also has a history of peripheral arterial disease and underwent right common iliac and femoral artery stenting in 2003, but does have bilateral disease which has not been studied since 2009. In addition he has hypertension, GERD and is here today for preoperative cardiac evaluation. He's been having numbness and tingling in his hands as well as legs and is felt to have a cord problem in the neck. His neurosurgeon is Dr. Cyndy Freeze and was planning on operating yesterday however he was noted to have an abnormal EKG and was sent for preoperative evaluation. It should be noted he had a negative nuclear stress test for ischemia in 07/2013. Currently denies any chest pain. He is able to exert himself even walking up stairs without shortness of breath but is having problems with numbness and tingling in his legs as well as cool extremities.  Jonathan [name] returns today for follow-up. He underwent lumbar surgery by Dr. Cyndy Freeze and he reports he still has problems with leg pain and cramping. He denies any chest pain or worsening shortness of breath. Blood pressure is well-controlled today.  11/23/2016  Jonathan [name] returns today for follow-up. He is generally without complaints. He reports that he is taking 10 mg of Crestor every other day due to some leg pains. He says this is improved his symptoms somewhat. Cholesterol however is not at goal. EKG shows normal sinus rhythm. He has no chest  pain or shortness of breath. He continues to say that he's had no significant improvement in his back pain with back surgery.  11/23/2017  Jonathan [name] was seen today in follow-up.  Overall he seems to be doing well.  He reports some occasional lightheadedness but no chest pain or worsening shortness of breath.  He reports some fatigue in his legs but no significant claudication.  He did have some mild PAD in 2015 which was unchanged since 2009.  He is also overdue for a lipid profile.  He has not followed up with his primary care provider except for when he gets sick.  02/16/2021  Jonathan [name] returns today for follow-up.  Overall he seems to be doing well.  He had last seen by Almyra Deforest, PA-C.  He had describes some intermittent chest pain however he says he is had none since then.  He also has a history of some PAD.  He had Dopplers back in 2020, but denies any claudication.  He says he plays golf is fairly active.  He is recently doing some bowling.  He had lipids reassessed in October which showed triglycerides were normal at 172.  He is not currently taking Vascepa, but is on ezetimibe and rosuvastatin.  Blood pressure is elevated today however he chalked it up to going out bowling last night and drinking probably too much alcohol.  11/07/2022  Jonathan [name] is seen today in follow-up.  He recently had seen Christen Bame, NP, for concerns of chest pain.  He underwent a  Myoview stress test which I interpreted back in September.  This showed a large, reversible basal to apical inferior perfusion defect that partially improves with upright imaging.  I felt that there might be a component of attenuation artifact however could not rule out reversible ischemia.  Initial medical therapy was recommended and he was started on Ranexa in addition to Imdur.  He has had some minimal relief with this but has had to take nitro least 5 times since then.  He also notes that he was trying to cut down a tree limb with a hand  saw and developed chest pain and had to stop.  He has since then avoided any significant physical activity.  PMHx:  Past Medical History:  Diagnosis Date   Anemia    CAD (coronary artery disease)    Femoral artery stenosis (HCC)    Gastroesophageal reflux    H/O hiatal hernia    Hyperlipidemia    Hypertension    Myocardial infarction [facility]) 11/2001   PAD (peripheral artery disease) ([place])    bilateral iliac artery stents    PONV (postoperative nausea and vomiting)    most of the time he gets nauseated   Prostate hyperplasia with urinary obstruction    S/P CABG x 1 2009   R radial to PDA   Tobacco consumption    quit cigarettes in 2008, smokes 5-6 cigars/week    Past Surgical History:  Procedure Laterality Date   ANTERIOR CERVICAL DECOMP/DISCECTOMY FUSION N/A 10/22/2014   Procedure: CERVICAL SIX SEVEN ANTERIOR CERVICAL DECOMPRESSION/DISCECTOMY FUSION  ;  Surgeon: Ashok Pall, MD;  Location: [place] NEURO ORS;  Service: Neurosurgery;  Laterality: N/A;  C67 Possible C7T1 anterior cervical decompression with fusion plating and bonegraft   BACK SURGERY     CARDIAC CATHETERIZATION  12/03/2001   2.5x1m S-660 stent to RCA (MI)   CARDIAC CATHETERIZATION  05/06/2002   PTCA & stenting of LAD and PTCA & stenting of mid/distal RCA with 3.0x160mZeta stent   CARDIAC CATHETERIZATION  06/03/2002   LAD with prox 30% in-stent restenosis; ramus with 95% ostial lesion; L Cfx & 3-OMs free of disease; mild 30% RCA narrowing & 20-30% in-stent restenosis in RCA (Dr. R.Gerrie Nordmann  [place]/30/2004   overlapping tandem DES 3.0x23 Cypher stent to mid RCA for in-stent restenosis & new lesion beyond previous stent with 3.0x12 SciMed TAXUS (Dr. R.Marella Chimes  [place]/14/2004   3.0x24 TAXUS overlapping 3.0x2037mAXUS DES in RCA and cuttin balloon atherectomy (Dr. R. Marella Chimes CARDIAC CATHETERIZATION  07/10/2007   normal L main, L Cfx & 3-OMs free of disease; LAD with Zeta  stent in prox region (patent); RCA with multiple stents (3.0x12 Taxus in ostium with 3.0x20 Taxus overlapping it, and series of 3.0x24 Tacus and Cypher stent overlapping this, and in distal RC 2x5x20m77mta(?) stent; ostium of RCA with 80% narrowing - 3x15 cutting balloon - 80% to patient w/NO obstruction (Dr. A. LLoni Musettle)   [place]   high-grade osital & prox in-stent RCA restenosis - Dr. R. WMarella ChimesOLONOSCOPY N/A 11/22/2015   Procedure: COLONOSCOPY;  Surgeon: RobeDaneil Dolin;  [facility];  Service: Endoscopy;  Laterality: N/A;  100   CORONARY ARTERY BYPASS GRAFT  2009   free right radial to PDA for progressive RCA disease & multiple re-stenosis (Dr. B. BEllison HughsILIAC ARTERY STENT Bilateral 01/30/2002   9x40mm55mdis SmartStent to LCIA, 9x3.39mm 14msis stent  to RCIA (Dr. Adora Fridge)   Lower Extremity Arterial Doppler  2009   R CIA stent - less than 50% diameter reduction; R CFA stent - 0-49% diameter reduction; R SFA less than 50% diameter reduction; L CFA, profunda, SFA - equal/less than 50% diameter reduction   LUMBAR LAMINECTOMY/DECOMPRESSION MICRODISCECTOMY Bilateral 03/18/2015   Procedure: LUMBAR LAMINECTOMY/DECOMPRESSION MICRODISCECTOMY 2 LEVELS;  Surgeon: Ashok Pall, MD;  Location: [place] NEURO ORS;  Service: Neurosurgery;  Laterality: Bilateral;  Bilateral L34 L45 laminectomies   NM MYOCAR PERF WALL MOTION  07/2013   bruce myoview - intermediate risk test with abnormal inferior horizontal ST depression; mild, fixed inferobasal defect, but no ischemia   TRANSTHORACIC ECHOCARDIOGRAM  07/2013   EF 55-60%, mild LVH; mild MR; LA mildly dilated; RVSP increased; PA peak pressure 68mHg (mod pulm HTN)    FAMHx:  Family History  Problem Relation Age of Onset   Heart disease Father    CAD Brother        CABG   CAD Brother        CABG   Colon cancer Neg Hx     SOCHx:   reports that he quit smoking about 2 months ago. His smoking use included  cigarettes. He has a 6.00 pack-year smoking history. He has never used smokeless tobacco. He reports current alcohol use of about 12.0 standard drinks of alcohol per week. He reports that he does not use drugs.  ALLERGIES:  No Known Allergies  ROS: Pertinent items noted in HPI and remainder of comprehensive ROS otherwise negative.  HOME MEDS: Current Outpatient Medications  Medication Sig Dispense Refill   aspirin EC 81 MG tablet Take 81 mg by mouth daily.     esomeprazole (NEXIUM) 20 MG capsule Take 20 mg by mouth daily at 12 noon.     ezetimibe (ZETIA) 10 MG tablet Take 1 tablet by mouth daily. Please make appointment for further refills. 30 tablet 8   fenofibrate (TRICOR) 145 MG tablet Take 1 tablet (145 mg total) by mouth daily. 90 tablet 3   isosorbide mononitrate (IMDUR) 30 MG 24 hr tablet Take 1 tablet (30 mg total) by mouth daily. 90 tablet 3   metoprolol succinate (TOPROL-XL) 50 MG 24 hr tablet TAKE 1 TABLET BY MOUTH EVERY DAY WITH OR IMMEDIATELY FOLLOWING A MEAL. 90 tablet 3   nitroGLYCERIN (NITROSTAT) 0.4 MG SL tablet Place 1 tablet (0.4 mg total) under the tongue every 5 (five) minutes as needed for chest pain. 25 tablet 3   ramipril (ALTACE) 10 MG capsule TAKE 1 CAPSULE BY MOUTH DAILY 90 capsule 3   ranolazine (RANEXA) 500 MG 12 hr tablet Take 1 tablet (500 mg total) by mouth 2 (two) times daily. 60 tablet 11   rosuvastatin (CRESTOR) 5 MG tablet TAKE 1 TABLET(5 MG) BY MOUTH AT BEDTIME 90 tablet 2   tamsulosin (FLOMAX) 0.4 MG CAPS capsule Take 0.4 mg by mouth daily.     No current facility-administered medications for this visit.    LABS/IMAGING: No results found for this or any previous visit (from the past 48 hour(s)). No results found.  VITALS: BP (!) 155/70 (BP Location: Left Arm, Patient Position: Sitting)   Pulse 68   Ht '5\' 7"'$  (1.702 m)   Wt 178 lb (80.7 kg)   SpO2 96%   BMI 27.88 kg/m   EXAM: General appearance: alert and no distress Neck: no carotid bruit  and no JVD Lungs: clear to auscultation bilaterally Heart: regular rate and rhythm, S1, S2  normal, no murmur, click, rub or gallop Abdomen: soft, non-tender; bowel sounds normal; no masses,  no organomegaly Extremities: cool to touch, diminished pulses no edema Pulses: 1+ Skin: pale, cool, dry Neurologic: Mental status: Alert, oriented, thought content appropriate Psych: Normal  EKG: Normal sinus rhythm at 68-personally reviewed  ASSESSMENT: Unstable angina Abnormal Myoview stress test with reversible inferior ischemia Coronary artery disease status post single vessel CABG in 2009 (free right radial to PDA) Numerous prior coronary interventions to the RCA and LAD with brachii therapy PAD status post right common iliac and right superficial femoral artery stents in 2003 Hypertension Dyslipidemia  PLAN: 1.   Mr. Dutch has been having progressive chest pain symptoms concerning for unstable angina.  He had an abnormal Myoview showing reversible inferior perfusion defect.  He has had prior inferior interventions and free right radial to PDA grafting.  He has been on Imdur and Ranexa over the past couple months but still is having chest pain requiring more nitro and has had exertional chest pain and avoided activities because of this.  His angina is now lifestyle limiting.  Would advise proceeding to cardiac catheterization to see if we can offer him an interventional option.  I did discuss the risk, benefits and alternatives of cardiac catheterization he is agreeable to proceed.    Shared Decision Making/Informed Consent The risks [stroke (1 in 1000), death (1 in 1000), kidney failure [usually temporary] (1 in 500), bleeding (1 in 200), allergic reaction [possibly serious] (1 in 200)], benefits (diagnostic support and management of coronary artery disease) and alternatives of a cardiac catheterization were discussed in detail with Jonathan [name] and he is willing to proceed.   Pixie Casino, MD,  [facility], [place] Director of the Advanced Lipid Disorders &  Cardiovascular Risk Reduction Clinic Attending Cardiologist  Direct Dial: [phone]  Fax: [phone]  Website:  [email or web address] 11/07/2022, 8:53 AM

## 2022-11-07 NOTE — H&P (View-Only) (Signed)
OFFICE NOTE  Chief Complaint:  Chest pain, abnormal stress test  Primary Care Physician: Sharilyn Sites, MD  HPI:  Jonathan [name] is a pleasant 71 year old male patient who was formerly followed by Dr. Rollene Fare. He has an extensive past medical history of coronary disease. He has had numerous prior stents to the right coronary artery and LAD and underwent brachii therapy to the LAD. Eventually developed occlusion of the right coronary artery and ultimately underwent a right free radial graft to the PDA by Dr. Cyndia Bent in 2009. He also has a history of peripheral arterial disease and underwent right common iliac and femoral artery stenting in 2003, but does have bilateral disease which has not been studied since 2009. In addition he has hypertension, GERD and is here today for preoperative cardiac evaluation. He's been having numbness and tingling in his hands as well as legs and is felt to have a cord problem in the neck. His neurosurgeon is Dr. Cyndy Freeze and was planning on operating yesterday however he was noted to have an abnormal EKG and was sent for preoperative evaluation. It should be noted he had a negative nuclear stress test for ischemia in 07/2013. Currently denies any chest pain. He is able to exert himself even walking up stairs without shortness of breath but is having problems with numbness and tingling in his legs as well as cool extremities.  Jonathan [name] returns today for follow-up. He underwent lumbar surgery by Dr. Cyndy Freeze and he reports he still has problems with leg pain and cramping. He denies any chest pain or worsening shortness of breath. Blood pressure is well-controlled today.  11/23/2016  Jonathan [name] returns today for follow-up. He is generally without complaints. He reports that he is taking 10 mg of Crestor every other day due to some leg pains. He says this is improved his symptoms somewhat. Cholesterol however is not at goal. EKG shows normal sinus rhythm. He has no chest  pain or shortness of breath. He continues to say that he's had no significant improvement in his back pain with back surgery.  11/23/2017  Jonathan [name] was seen today in follow-up.  Overall he seems to be doing well.  He reports some occasional lightheadedness but no chest pain or worsening shortness of breath.  He reports some fatigue in his legs but no significant claudication.  He did have some mild PAD in 2015 which was unchanged since 2009.  He is also overdue for a lipid profile.  He has not followed up with his primary care provider except for when he gets sick.  02/16/2021  Jonathan [name] returns today for follow-up.  Overall he seems to be doing well.  He had last seen by Almyra Deforest, PA-C.  He had describes some intermittent chest pain however he says he is had none since then.  He also has a history of some PAD.  He had Dopplers back in 2020, but denies any claudication.  He says he plays golf is fairly active.  He is recently doing some bowling.  He had lipids reassessed in October which showed triglycerides were normal at 172.  He is not currently taking Vascepa, but is on ezetimibe and rosuvastatin.  Blood pressure is elevated today however he chalked it up to going out bowling last night and drinking probably too much alcohol.  11/07/2022  Jonathan [name] is seen today in follow-up.  He recently had seen Christen Bame, NP, for concerns of chest pain.  He underwent a  Myoview stress test which I interpreted back in September.  This showed a large, reversible basal to apical inferior perfusion defect that partially improves with upright imaging.  I felt that there might be a component of attenuation artifact however could not rule out reversible ischemia.  Initial medical therapy was recommended and he was started on Ranexa in addition to Imdur.  He has had some minimal relief with this but has had to take nitro least 5 times since then.  He also notes that he was trying to cut down a tree limb with a hand  saw and developed chest pain and had to stop.  He has since then avoided any significant physical activity.  PMHx:  Past Medical History:  Diagnosis Date   Anemia    CAD (coronary artery disease)    Femoral artery stenosis (HCC)    Gastroesophageal reflux    H/O hiatal hernia    Hyperlipidemia    Hypertension    Myocardial infarction [facility]) 11/2001   PAD (peripheral artery disease) ([place])    bilateral iliac artery stents    PONV (postoperative nausea and vomiting)    most of the time he gets nauseated   Prostate hyperplasia with urinary obstruction    S/P CABG x 1 2009   R radial to PDA   Tobacco consumption    quit cigarettes in 2008, smokes 5-6 cigars/week    Past Surgical History:  Procedure Laterality Date   ANTERIOR CERVICAL DECOMP/DISCECTOMY FUSION N/A 10/22/2014   Procedure: CERVICAL SIX SEVEN ANTERIOR CERVICAL DECOMPRESSION/DISCECTOMY FUSION  ;  Surgeon: Ashok Pall, MD;  Location: [place] NEURO ORS;  Service: Neurosurgery;  Laterality: N/A;  C67 Possible C7T1 anterior cervical decompression with fusion plating and bonegraft   BACK SURGERY     CARDIAC CATHETERIZATION  12/03/2001   2.5x76m S-660 stent to RCA (MI)   CARDIAC CATHETERIZATION  05/06/2002   PTCA & stenting of LAD and PTCA & stenting of mid/distal RCA with 3.0x182mZeta stent   CARDIAC CATHETERIZATION  06/03/2002   LAD with prox 30% in-stent restenosis; ramus with 95% ostial lesion; L Cfx & 3-OMs free of disease; mild 30% RCA narrowing & 20-30% in-stent restenosis in RCA (Dr. R.Gerrie Nordmann  [place]3/[place]   overlapping tandem DES 3.0x23 Cypher stent to mid RCA for in-stent restenosis & new lesion beyond previous stent with 3.0x12 SciMed TAXUS (Dr. R.Marella Chimes  [place]/14/2004   3.0x24 TAXUS overlapping 3.0x2045mAXUS DES in RCA and cuttin balloon atherectomy (Dr. R. Marella Chimes CARDIAC CATHETERIZATION  07/10/2007   normal L main, L Cfx & 3-OMs free of disease; LAD with Zeta  stent in prox region (patent); RCA with multiple stents (3.0x12 Taxus in ostium with 3.0x20 Taxus overlapping it, and series of 3.0x24 Tacus and Cypher stent overlapping this, and in distal RC 2x5x20m79mta(?) stent; ostium of RCA with 80% narrowing - 3x15 cutting balloon - 80% to patient w/NO obstruction (Dr. A. LLoni Musettle)   [place]   high-grade osital & prox in-stent RCA restenosis - Dr. R. WMarella ChimesOLONOSCOPY N/A 11/22/2015   Procedure: COLONOSCOPY;  Surgeon: RobeDaneil Dolin;  [facility];  Service: Endoscopy;  Laterality: N/A;  100   CORONARY ARTERY BYPASS GRAFT  2009   free right radial to PDA for progressive RCA disease & multiple re-stenosis (Dr. B. BEllison HughsILIAC ARTERY STENT Bilateral 01/30/2002   9x40mm66mdis SmartStent to LCIA, 9x3.39mm 31msis stent  to RCIA (Dr. Adora Fridge)   Lower Extremity Arterial Doppler  2009   R CIA stent - less than 50% diameter reduction; R CFA stent - 0-49% diameter reduction; R SFA less than 50% diameter reduction; L CFA, profunda, SFA - equal/less than 50% diameter reduction   LUMBAR LAMINECTOMY/DECOMPRESSION MICRODISCECTOMY Bilateral 03/18/2015   Procedure: LUMBAR LAMINECTOMY/DECOMPRESSION MICRODISCECTOMY 2 LEVELS;  Surgeon: Ashok Pall, MD;  Location: [place] NEURO ORS;  Service: Neurosurgery;  Laterality: Bilateral;  Bilateral L34 L45 laminectomies   NM MYOCAR PERF WALL MOTION  07/2013   bruce myoview - intermediate risk test with abnormal inferior horizontal ST depression; mild, fixed inferobasal defect, but no ischemia   TRANSTHORACIC ECHOCARDIOGRAM  07/2013   EF 55-60%, mild LVH; mild MR; LA mildly dilated; RVSP increased; PA peak pressure 28mHg (mod pulm HTN)    FAMHx:  Family History  Problem Relation Age of Onset   Heart disease Father    CAD Brother        CABG   CAD Brother        CABG   Colon cancer Neg Hx     SOCHx:   reports that he quit smoking about 2 months ago. His smoking use included  cigarettes. He has a 6.00 pack-year smoking history. He has never used smokeless tobacco. He reports current alcohol use of about 12.0 standard drinks of alcohol per week. He reports that he does not use drugs.  ALLERGIES:  No Known Allergies  ROS: Pertinent items noted in HPI and remainder of comprehensive ROS otherwise negative.  HOME MEDS: Current Outpatient Medications  Medication Sig Dispense Refill   aspirin EC 81 MG tablet Take 81 mg by mouth daily.     esomeprazole (NEXIUM) 20 MG capsule Take 20 mg by mouth daily at 12 noon.     ezetimibe (ZETIA) 10 MG tablet Take 1 tablet by mouth daily. Please make appointment for further refills. 30 tablet 8   fenofibrate (TRICOR) 145 MG tablet Take 1 tablet (145 mg total) by mouth daily. 90 tablet 3   isosorbide mononitrate (IMDUR) 30 MG 24 hr tablet Take 1 tablet (30 mg total) by mouth daily. 90 tablet 3   metoprolol succinate (TOPROL-XL) 50 MG 24 hr tablet TAKE 1 TABLET BY MOUTH EVERY DAY WITH OR IMMEDIATELY FOLLOWING A MEAL. 90 tablet 3   nitroGLYCERIN (NITROSTAT) 0.4 MG SL tablet Place 1 tablet (0.4 mg total) under the tongue every 5 (five) minutes as needed for chest pain. 25 tablet 3   ramipril (ALTACE) 10 MG capsule TAKE 1 CAPSULE BY MOUTH DAILY 90 capsule 3   ranolazine (RANEXA) 500 MG 12 hr tablet Take 1 tablet (500 mg total) by mouth 2 (two) times daily. 60 tablet 11   rosuvastatin (CRESTOR) 5 MG tablet TAKE 1 TABLET(5 MG) BY MOUTH AT BEDTIME 90 tablet 2   tamsulosin (FLOMAX) 0.4 MG CAPS capsule Take 0.4 mg by mouth daily.     No current facility-administered medications for this visit.    LABS/IMAGING: No results found for this or any previous visit (from the past 48 hour(s)). No results found.  VITALS: BP (!) 155/70 (BP Location: Left Arm, Patient Position: Sitting)   Pulse 68   Ht '5\' 7"'$  (1.702 m)   Wt 178 lb (80.7 kg)   SpO2 96%   BMI 27.88 kg/m   EXAM: General appearance: alert and no distress Neck: no carotid bruit  and no JVD Lungs: clear to auscultation bilaterally Heart: regular rate and rhythm, S1, S2  normal, no murmur, click, rub or gallop Abdomen: soft, non-tender; bowel sounds normal; no masses,  no organomegaly Extremities: cool to touch, diminished pulses no edema Pulses: 1+ Skin: pale, cool, dry Neurologic: Mental status: Alert, oriented, thought content appropriate Psych: Normal  EKG: Normal sinus rhythm at 68-personally reviewed  ASSESSMENT: Unstable angina Abnormal Myoview stress test with reversible inferior ischemia Coronary artery disease status post single vessel CABG in 2009 (free right radial to PDA) Numerous prior coronary interventions to the RCA and LAD with brachii therapy PAD status post right common iliac and right superficial femoral artery stents in 2003 Hypertension Dyslipidemia  PLAN: 1.   Jonathan [name] has been having progressive chest pain symptoms concerning for unstable angina.  He had an abnormal Myoview showing reversible inferior perfusion defect.  He has had prior inferior interventions and free right radial to PDA grafting.  He has been on Imdur and Ranexa over the past couple months but still is having chest pain requiring more nitro and has had exertional chest pain and avoided activities because of this.  His angina is now lifestyle limiting.  Would advise proceeding to cardiac catheterization to see if we can offer him an interventional option.  I did discuss the risk, benefits and alternatives of cardiac catheterization he is agreeable to proceed.    Shared Decision Making/Informed Consent The risks [stroke (1 in 1000), death (1 in 1000), kidney failure [usually temporary] (1 in 500), bleeding (1 in 200), allergic reaction [possibly serious] (1 in 200)], benefits (diagnostic support and management of coronary artery disease) and alternatives of a cardiac catheterization were discussed in detail with Jonathan [name] and he is willing to proceed.   Pixie Casino, MD,  [facility], [place] Director of the Advanced Lipid Disorders &  Cardiovascular Risk Reduction Clinic Attending Cardiologist  Direct Dial: [phone]  Fax: [phone]  Website:  [email or web address] 11/07/2022, 8:53 AM

## 2022-11-07 NOTE — Patient Instructions (Signed)
Medication Instructions:  NO CHANGES  *If you need a refill on your cardiac medications before your next appointment, please call your pharmacy*   Lab Work: BMET, CBC today   If you have labs (blood work) drawn today and your tests are completely normal, you will receive your results only by: [place] (if you have MyChart) OR A paper copy in the mail If you have any lab test that is abnormal or we need to change your treatment, we will call you to review the results.   Testing/Procedures: Left Heart Cath at [place]: At [facility], you and your health needs are our priority.  As part of our continuing mission to provide you with exceptional heart care, we have created designated Provider Care Teams.  These Care Teams include your primary Cardiologist (physician) and Advanced Practice Providers (APPs -  Physician Assistants and Nurse Practitioners) who all work together to provide you with the care you need, when you need it.  We recommend signing up for the patient portal called "MyChart".  Sign up information is provided on this After Visit Summary.  MyChart is used to connect with patients for Virtual Visits (Telemedicine).  Patients are able to view lab/test results, encounter notes, upcoming appointments, etc.  Non-urgent messages can be sent to your provider as well.   To learn more about what you can do with MyChart, go to [email or web address].    Your next appointment:    December 4 with Dr. Debara Pickett  Other Instructions  [facility] A DEPT OF [place]. [facility] [place] A DEPT OF [place]. [facility] [place] [id number] [place] [address] Dept: [phone] Loc: [phone]  Jonathan [name]  11/07/2022  You are scheduled for a Cardiac Catheterization on Wednesday, November 15 with Dr. Glenetta Hew.  1. Please arrive at the [facility] (Main Entrance A) at  [facility]: [address] [place], [place] [address] at 9:30 AM (This time is two hours before your procedure to ensure your preparation). Free valet parking service is available.   Special note: Every effort is made to have your procedure done on time. Please understand that emergencies sometimes delay scheduled procedures.  2. Diet: Do not eat solid foods after midnight.  The patient may have clear liquids until 5am upon the day of the procedure.  3. Labs: You will need to have blood drawn TODAY 11/07/22  4. Medication instructions in preparation for your procedure:  On the morning of your procedure, take your Aspirin 81 mg and any morning medicines NOT listed above.  You may use sips of water.  5. Plan for one night stay--bring personal belongings. 6. Bring a current list of your medications and current insurance cards. 7. You MUST have a responsible person to drive you home. 8. Someone MUST be with you the first 24 hours after you arrive home or your discharge will be delayed. 9. Please wear clothes that are easy to get on and off and wear slip-on shoes.  Thank you for allowing [place] to care for you!   -- [facility]

## 2022-11-08 ENCOUNTER — Other Ambulatory Visit (HOSPITAL_COMMUNITY): Payer: Self-pay

## 2022-11-08 ENCOUNTER — Other Ambulatory Visit: Payer: Self-pay

## 2022-11-08 ENCOUNTER — Ambulatory Visit (HOSPITAL_COMMUNITY)
Admission: RE | Admit: 2022-11-08 | Discharge: 2022-11-08 | Disposition: A | Discharge status: Home / Self Care | Payer: Medicare HMO | Attending: Cardiology | Admitting: Cardiology

## 2022-11-08 ENCOUNTER — Encounter (HOSPITAL_COMMUNITY)
Admission: RE | Disposition: A | Discharge status: Home / Self Care | Payer: Self-pay | Source: Home / Self Care | Attending: Cardiology

## 2022-11-08 DIAGNOSIS — Z87891 Personal history of nicotine dependence: Secondary | ICD-10-CM | POA: Insufficient documentation

## 2022-11-08 DIAGNOSIS — I739 Peripheral vascular disease, unspecified: Secondary | ICD-10-CM | POA: Insufficient documentation

## 2022-11-08 DIAGNOSIS — R9439 Abnormal result of other cardiovascular function study: Secondary | ICD-10-CM | POA: Diagnosis present

## 2022-11-08 DIAGNOSIS — E785 Hyperlipidemia, unspecified: Secondary | ICD-10-CM | POA: Insufficient documentation

## 2022-11-08 DIAGNOSIS — Z955 Presence of coronary angioplasty implant and graft: Secondary | ICD-10-CM

## 2022-11-08 DIAGNOSIS — I2510 Atherosclerotic heart disease of native coronary artery without angina pectoris: Secondary | ICD-10-CM

## 2022-11-08 DIAGNOSIS — I2582 Chronic total occlusion of coronary artery: Secondary | ICD-10-CM | POA: Diagnosis not present

## 2022-11-08 DIAGNOSIS — Z01812 Encounter for preprocedural laboratory examination: Secondary | ICD-10-CM

## 2022-11-08 DIAGNOSIS — I25110 Atherosclerotic heart disease of native coronary artery with unstable angina pectoris: Secondary | ICD-10-CM | POA: Diagnosis not present

## 2022-11-08 DIAGNOSIS — Z951 Presence of aortocoronary bypass graft: Secondary | ICD-10-CM

## 2022-11-08 DIAGNOSIS — I10 Essential (primary) hypertension: Secondary | ICD-10-CM | POA: Diagnosis not present

## 2022-11-08 DIAGNOSIS — I25119 Atherosclerotic heart disease of native coronary artery with unspecified angina pectoris: Secondary | ICD-10-CM | POA: Diagnosis present

## 2022-11-08 DIAGNOSIS — I200 Unstable angina: Secondary | ICD-10-CM

## 2022-11-08 HISTORY — PX: CORONARY STENT INTERVENTION: CATH118234

## 2022-11-08 HISTORY — PX: LEFT HEART CATH AND CORS/GRAFTS ANGIOGRAPHY: CATH118250

## 2022-11-08 LAB — PLATELET COUNT: Platelets: 206 10*3/uL (ref 150–400)

## 2022-11-08 SURGERY — LEFT HEART CATH AND CORS/GRAFTS ANGIOGRAPHY
Anesthesia: LOCAL

## 2022-11-08 MED ORDER — LIDOCAINE HCL (PF) 1 % IJ SOLN
INTRAMUSCULAR | Status: AC
  Filled 2022-11-08: qty 30

## 2022-11-08 MED ORDER — ASPIRIN 81 MG PO TBEC
81.0000 mg | DELAYED_RELEASE_TABLET | Freq: Every day | ORAL | 5 refills | Status: DC
  Filled 2022-11-08: qty 30, 30d supply, fill #0

## 2022-11-08 MED ORDER — FENTANYL CITRATE (PF) 100 MCG/2ML IJ SOLN
INTRAMUSCULAR | Status: DC | PRN
  Administered 2022-11-08: 50 ug via INTRAVENOUS
  Administered 2022-11-08 (×2): 25 ug via INTRAVENOUS

## 2022-11-08 MED ORDER — SODIUM CHLORIDE 0.9 % IV SOLN: 250.0000 mL | INTRAVENOUS | Status: DC | PRN

## 2022-11-08 MED ORDER — MIDAZOLAM HCL 2 MG/2ML IJ SOLN
INTRAMUSCULAR | Status: DC | PRN
  Administered 2022-11-08: 2 mg via INTRAVENOUS
  Administered 2022-11-08: 1 mg via INTRAVENOUS

## 2022-11-08 MED ORDER — HEPARIN SODIUM (PORCINE) 1000 UNIT/ML IJ SOLN
INTRAMUSCULAR | Status: AC
  Filled 2022-11-08: qty 10

## 2022-11-08 MED ORDER — SODIUM CHLORIDE 0.9% FLUSH: 3.0000 mL | INTRAVENOUS | Status: DC | PRN

## 2022-11-08 MED ORDER — ASPIRIN 81 MG PO CHEW: 81.0000 mg | CHEWABLE_TABLET | ORAL | Status: DC

## 2022-11-08 MED ORDER — SODIUM CHLORIDE 0.9% FLUSH: 3.0000 mL | Freq: Two times a day (BID) | INTRAVENOUS | Status: DC

## 2022-11-08 MED ORDER — ONDANSETRON HCL 4 MG/2ML IJ SOLN
INTRAMUSCULAR | Status: AC
  Filled 2022-11-08: qty 2

## 2022-11-08 MED ORDER — MIDAZOLAM HCL 2 MG/2ML IJ SOLN
INTRAMUSCULAR | Status: AC
  Filled 2022-11-08: qty 2

## 2022-11-08 MED ORDER — CLOPIDOGREL BISULFATE 75 MG PO TABS
75.0000 mg | ORAL_TABLET | Freq: Every day | ORAL | 5 refills | Status: DC
  Filled 2022-11-08: qty 30, 30d supply, fill #0

## 2022-11-08 MED ORDER — FAMOTIDINE IN NACL 20-0.9 MG/50ML-% IV SOLN
INTRAVENOUS | Status: AC
  Filled 2022-11-08: qty 50

## 2022-11-08 MED ORDER — IOHEXOL 350 MG/ML SOLN
INTRAVENOUS | Status: DC | PRN
  Administered 2022-11-08: 175 mL

## 2022-11-08 MED ORDER — SODIUM CHLORIDE 0.9 % WEIGHT BASED INFUSION
3.0000 mL/kg/h | INTRAVENOUS | Status: AC
  Administered 2022-11-08: 3 mL/kg/h via INTRAVENOUS

## 2022-11-08 MED ORDER — ONDANSETRON HCL 4 MG/2ML IJ SOLN
INTRAMUSCULAR | Status: DC | PRN
  Administered 2022-11-08: 4 mg via INTRAVENOUS

## 2022-11-08 MED ORDER — SODIUM CHLORIDE 0.9 % WEIGHT BASED INFUSION: 1.0000 mL/kg/h | INTRAVENOUS | Status: DC

## 2022-11-08 MED ORDER — ACETAMINOPHEN 325 MG PO TABS: 650.0000 mg | ORAL_TABLET | ORAL | Status: DC | PRN

## 2022-11-08 MED ORDER — CLOPIDOGREL BISULFATE 300 MG PO TABS
ORAL_TABLET | ORAL | Status: DC | PRN
  Administered 2022-11-08: 600 mg via ORAL

## 2022-11-08 MED ORDER — ASPIRIN 81 MG PO TBEC: 81.0000 mg | DELAYED_RELEASE_TABLET | Freq: Every day | ORAL | 0 refills | Status: DC

## 2022-11-08 MED ORDER — HEPARIN SODIUM (PORCINE) 1000 UNIT/ML IJ SOLN
INTRAMUSCULAR | Status: DC | PRN
  Administered 2022-11-08 (×2): 3000 [IU] via INTRAVENOUS
  Administered 2022-11-08: 5000 [IU] via INTRAVENOUS
  Administered 2022-11-08: 4000 [IU] via INTRAVENOUS

## 2022-11-08 MED ORDER — ASPIRIN 81 MG PO CHEW: 81.0000 mg | CHEWABLE_TABLET | Freq: Every day | ORAL | Status: DC

## 2022-11-08 MED ORDER — CLOPIDOGREL BISULFATE 75 MG PO TABS: 75.0000 mg | ORAL_TABLET | Freq: Every day | ORAL | 0 refills | Status: DC

## 2022-11-08 MED ORDER — HEPARIN (PORCINE) IN NACL 1000-0.9 UT/500ML-% IV SOLN
INTRAVENOUS | Status: AC
  Filled 2022-11-08: qty 1000

## 2022-11-08 MED ORDER — CLOPIDOGREL BISULFATE 300 MG PO TABS
ORAL_TABLET | ORAL | Status: AC
  Filled 2022-11-08: qty 2

## 2022-11-08 MED ORDER — HEPARIN (PORCINE) IN NACL 1000-0.9 UT/500ML-% IV SOLN
INTRAVENOUS | Status: DC | PRN
  Administered 2022-11-08 (×2): 500 mL

## 2022-11-08 MED ORDER — LABETALOL HCL 5 MG/ML IV SOLN
INTRAVENOUS | Status: AC
  Filled 2022-11-08: qty 4

## 2022-11-08 MED ORDER — ONDANSETRON HCL 4 MG/2ML IJ SOLN: 4.0000 mg | Freq: Four times a day (QID) | INTRAMUSCULAR | Status: DC | PRN

## 2022-11-08 MED ORDER — LIDOCAINE HCL (PF) 1 % IJ SOLN
INTRAMUSCULAR | Status: DC | PRN
  Administered 2022-11-08: 2 mL

## 2022-11-08 MED ORDER — FENTANYL CITRATE (PF) 100 MCG/2ML IJ SOLN
INTRAMUSCULAR | Status: AC
  Filled 2022-11-08: qty 2

## 2022-11-08 MED ORDER — VERAPAMIL HCL 2.5 MG/ML IV SOLN
INTRAVENOUS | Status: DC | PRN
  Administered 2022-11-08: 10 mL via INTRA_ARTERIAL

## 2022-11-08 MED ORDER — PANTOPRAZOLE SODIUM 40 MG PO TBEC
40.0000 mg | DELAYED_RELEASE_TABLET | Freq: Every day | ORAL | 1 refills | Status: DC
  Filled 2022-11-08: qty 30, 30d supply, fill #0

## 2022-11-08 MED ORDER — VERAPAMIL HCL 2.5 MG/ML IV SOLN
INTRAVENOUS | Status: AC
  Filled 2022-11-08: qty 2

## 2022-11-08 MED ORDER — CLOPIDOGREL BISULFATE 75 MG PO TABS: 75.0000 mg | ORAL_TABLET | Freq: Every day | ORAL | Status: DC

## 2022-11-08 MED ORDER — LABETALOL HCL 5 MG/ML IV SOLN: 10.0000 mg | INTRAVENOUS | Status: DC | PRN

## 2022-11-08 MED ORDER — FAMOTIDINE IN NACL 20-0.9 MG/50ML-% IV SOLN
INTRAVENOUS | Status: AC | PRN
  Administered 2022-11-08: 20 mg via INTRAVENOUS

## 2022-11-08 MED ORDER — LABETALOL HCL 5 MG/ML IV SOLN
INTRAVENOUS | Status: DC | PRN
  Administered 2022-11-08: 10 mg via INTRAVENOUS

## 2022-11-08 SURGICAL SUPPLY — 23 items
BALL SAPPHIRE NC24 3.0X12 (BALLOONS) ×1
BALLN EMERGE MR 2.5X12 (BALLOONS) ×1
BALLN SCOREFLEX 2.75X10 (BALLOONS) ×1
BALLOON EMERGE MR 2.5X12 (BALLOONS) IMPLANT
BALLOON SAPPHIRE NC24 3.0X12 (BALLOONS) IMPLANT
BALLOON SCOREFLEX 2.75X10 (BALLOONS) IMPLANT
CATH INFINITI 5FR MULTPACK ANG (CATHETERS) IMPLANT
CATH VISTA GUIDE 6FR XB3.5 (CATHETERS) IMPLANT
DEVICE RAD COMP TR BAND LRG (VASCULAR PRODUCTS) IMPLANT
ELECT DEFIB PAD ADLT CADENCE (PAD) IMPLANT
GLIDESHEATH SLEND SS 6F .021 (SHEATH) IMPLANT
GUIDEWIRE INQWIRE 1.5J.035X260 (WIRE) IMPLANT
INQWIRE 1.5J .035X260CM (WIRE) ×1
KIT ENCORE 26 ADVANTAGE (KITS) IMPLANT
KIT HEART LEFT (KITS) ×1 IMPLANT
PACK CARDIAC CATHETERIZATION (CUSTOM PROCEDURE TRAY) ×1 IMPLANT
SHEATH PROBE COVER 6X72 (BAG) IMPLANT
STENT SYNERGY XD 2.75X16 (Permanent Stent) IMPLANT
SYNERGY XD 2.75X16 (Permanent Stent) ×1 IMPLANT
SYR MEDRAD MARK 7 150ML (SYRINGE) ×1 IMPLANT
TRANSDUCER W/STOPCOCK (MISCELLANEOUS) ×1 IMPLANT
TUBING CIL FLEX 10 FLL-RA (TUBING) ×1 IMPLANT
WIRE ASAHI PROWATER 180CM (WIRE) IMPLANT

## 2022-11-08 NOTE — Progress Notes (Signed)
TR BAND REMOVAL  LOCATION:    left radial  DEFLATED PER PROTOCOL:    Yes.    TIME BAND OFF / DRESSING APPLIED: 11/08/22 at [place] ARRIVAL:    Level 0  SITE AFTER BAND REMOVAL:    Level 0  CIRCULATION SENSATION AND MOVEMENT:    Within Normal Limits   Yes.    COMMENTS:

## 2022-11-08 NOTE — Progress Notes (Signed)
CARDIAC REHAB PHASE I     Post stent education including risk factors, site care, restrictions, antiplatelet therapy importance, exercise guidelines, heart healthy diet and CRP2 reviewed. All questions and concerns addressed. Will refer to AP for CRP2. Plan for home today   [id number]   Vanessa Barbara, RN BSN 11/08/2022 3:09 PM

## 2022-11-08 NOTE — Interval H&P Note (Signed)
History and Physical Interval Note:  11/08/2022 12:36 PM  Jonathan [name]  has presented today for surgery, with the diagnosis of chest pain - abnormal stress test.  The various methods of treatment have been discussed with the patient and family. After consideration of risks, benefits and other options for treatment, the patient has consented to  Procedure(s): LEFT HEART CATH AND CORS/GRAFTS ANGIOGRAPHY (N/A)  PERCUTANEOUS CORONARY INTERVENTION   as a surgical intervention.  The patient's history has been reviewed, patient examined, no change in status, stable for surgery.  I have reviewed the patient's chart and labs.  Questions were answered to the patient's satisfaction.    Cath Lab Visit (complete for each Cath Lab visit)  Clinical Evaluation Leading to the Procedure:   ACS: No.  Non-ACS:    Anginal Classification: CCS III  Anti-ischemic medical therapy: Maximal Therapy (2 or more classes of medications)  Non-Invasive Test Results: Intermediate-risk stress test findings: cardiac mortality 1-3%/year  Prior CABG: Previous CABG      Glenetta Hew

## 2022-11-08 NOTE — Discharge Summary (Signed)
Discharge Summary for Same Day PCI   Patient ID: Jonathan [name] MRN: [id number]; DOB: Apr 15, 1951  Admit date: 11/08/2022 Discharge date: 11/08/2022  Primary Care Provider: Sharilyn Sites, MD  Primary Cardiologist: Pixie Casino, MD  Primary Electrophysiologist:  None   Discharge Diagnoses    Principal Problem:   Coronary artery disease involving native coronary artery of native heart with angina pectoris [facility]) Active Problems:   S/P CABG x 1   Abnormal cardiovascular stress test   Unstable angina pectoris [facility])    Diagnostic Studies/Procedures    Cardiac Catheterization 11/08/2022:    Colon Flattery Cx lesion is 80% stenosed.   Scoring balloon angioplasty was performed using a BALLN SCOREFLEX 2.75X10.   A drug-eluting stent was successfully placed using a SYNERGY XD 2.75X16.  Deployed to 2.9 mm and postoperative 3.1 mm at the ostium and proximal lesion.   Post intervention, there is a 5% residual stenosis.   ------------------------------------------------   Minimal diffuse disease in the LAD and very small caliber Ramus   Ost RCA to Dist RCA lesion is 100% stenosed. ->  Extensive stents completely stenosed.  Flush occlusion.   Right Radial Artery graft (Rad-rPDA) was visualized by angiography and is normal in caliber. The graft exhibits no disease. There is no competitive flow   ------------------------------------------------   The left ventricular systolic function is normal.  The left ventricular ejection fraction is 55-65% by visual estimate.   LV end diastolic pressure is normal.  There is no aortic valve stenosis.   POSTPROCEDURE DIAGNOSES CULPRIT LESION: Ostial LCx 80% Successful ScoreFlex Angioplasty with DES PCI using Synergy XD 2.75 mm x 16 mm - deployed to 3.1 mm Lesion reduced to roughly 5% at the ostium but otherwise no residual stenosis, with TIMI-3 flow pre and post. 100% CTO of Native RCA with multiple overlapping stents from ostial to distal Widely patent R  Radial-rPDA widely patent with antegrade filling of the downstream PDA along with retrograde filling of the RPAV and 2 PL branches Normal LV EF by LV gram, with normal LVEDP.      RECOMMENDATIONS Continue home medications for CAD management. DAPT for minimum of 6 months, then would continue Plavix long-term based on ostial stent Same-day discharge and follow-up with Dr. Shon Hale Motion              Left Heart  Left Ventricle The left ventricular size is normal. The left ventricular systolic function is normal. LV end diastolic pressure is normal. The left ventricular ejection fraction is 55-65% by visual estimate. No regional wall motion abnormalities.  Aortic Valve There is no aortic valve stenosis.   Coronary Diagrams  Diagnostic Dominance: Right  Intervention   _____________   History of Present Illness     Jonathan [name] is a 71 y.o. male with history of CAD s/p multiple stents and CABG x1, femoral artery stenosis/PAD, GERD, hiatal hernia, hyperlipidemia hypertension. Patient saw Dr. Debara Pickett in clinic on 11/14 for routine follow up after a recent Myoview stress test ordered by Christen Bame NP. This test showed a large, reversible basal to apical inferior perfusion defect. Reversible ischemia could not be ruled out. Medical therapy titrated, Ranexa added to existing Imdur regimen. Despite these changes, patient continued to experience exertional angina with concern for unstable symptoms. Given progressing symptoms, cardiac catheterization was arranged for further evaluation.  Hospital Course     The patient underwent cardiac cath as noted above with Dr. Ellyn Hack. Plan for DAPT with ASA/Plavix for  at least 6 months, preferably longterm Plavix monotherapy afterwards due to ostial lesion. The patient was seen by cardiac rehab while in short stay. There were no observed complications post cath. Radial cath site was re-evaluated prior to discharge and found to be stable  without any complications. Instructions/precautions regarding cath site care were given prior to discharge.  Jonathan [name] was seen by Dr. Ellyn Hack and determined stable for discharge home. Follow up with our office has been arranged. Medications are listed below. Pertinent changes include initiation of daily Plavix 39m.    _____________  Cath/PCI Registry Performance & Quality Measures: Aspirin prescribed? - Yes ADP Receptor Inhibitor (Plavix/Clopidogrel, Brilinta/Ticagrelor or Effient/Prasugrel) prescribed (includes medically managed patients)? - Yes High Intensity Statin (Lipitor 40-848mor Crestor 20-4037mprescribed? - Yes For EF <40%, was ACEI/ARB prescribed? - Not Applicable (EF >/= 40%09%or EF <40%, Aldosterone Antagonist (Spironolactone or Eplerenone) prescribed? - Not Applicable (EF >/= 40%98%ardiac Rehab Phase II ordered (Included Medically managed Patients)? - Yes  _____________   Discharge Vitals and physical exam Blood pressure (!) 139/45, pulse 75, temperature 98.1 F (36.7 C), temperature source Oral, resp. rate 14, height _0  (1.702 m), weight 80.7 kg, SpO2 95 %.  Filed Weights   11/08/22 1008  Weight: 80.7 kg   Physical Exam Constitutional:      General: He is not in acute distress.    Appearance: Normal appearance. He is not ill-appearing.  Cardiovascular:     Rate and Rhythm: Normal rate and regular rhythm.  Pulmonary:     Effort: Pulmonary effort is normal.     Breath sounds: Normal breath sounds.  Abdominal:     General: Abdomen is flat.     Palpations: Abdomen is soft.  Musculoskeletal:     Right lower leg: No edema.     Left lower leg: No edema.  Skin:    General: Skin is warm and dry.     Comments: TR band in place on left wrist. No bleeding, hematoma, swelling.  Neurological:     Mental Status: He is alert.     Last Labs & Radiologic Studies    CBC Recent Labs    11/07/22 0919 11/08/22 1019  WBC 6.7  --   HGB 13.1  --   HCT 38.4   --   MCV 90  --   PLT CANCELED [id number] Metabolic Panel Recent Labs    11/07/22 0919  NA 135  K 5.2  CL 101  CO2 24  GLUCOSE 102*  BUN 14  CREATININE 1.10  CALCIUM 9.6   Liver Function Tests No results for input(s): "AST", "ALT", "ALKPHOS", "BILITOT", "PROT", "ALBUMIN" in the last 72 hours. No results for input(s): "LIPASE", "AMYLASE" in the last 72 hours. High Sensitivity Troponin:   No results for input(s): "TROPONINIHS" in the last 720 hours.  BNP Invalid input(s): "POCBNP" D-Dimer No results for input(s): "DDIMER" in the last 72 hours. Hemoglobin A1C No results for input(s): "HGBA1C" in the last 72 hours. Fasting Lipid Panel No results for input(s): "CHOL", "HDL", "LDLCALC", "TRIG", "CHOLHDL", "LDLDIRECT" in the last 72 hours. Thyroid Function Tests No results for input(s): "TSH", "T4TOTAL", "T3FREE", "THYROIDAB" in the last 72 hours.  Invalid input(s): "FREET3" _____________  CARDIAC CATHETERIZATION  Result Date: 11/08/2022   OstColon Flattery lesion is 80% stenosed.   Scoring balloon angioplasty was performed using a BALLN SCOREFLEX 2.75X10.   A drug-eluting stent was successfully placed using a SYNERGY XD 2.75X16.  Deployed to 2.9  mm and postoperative 3.1 mm at the ostium and proximal lesion.   Post intervention, there is a 5% residual stenosis.   ------------------------------------------------   Minimal diffuse disease in the LAD and very small caliber Ramus   Ost RCA to Dist RCA lesion is 100% stenosed. ->  Extensive stents completely stenosed.  Flush occlusion.   Right Radial Artery graft (Rad-rPDA) was visualized by angiography and is normal in caliber. The graft exhibits no disease. There is no competitive flow   ------------------------------------------------   The left ventricular systolic function is normal.  The left ventricular ejection fraction is 55-65% by visual estimate.   LV end diastolic pressure is normal.  There is no aortic valve stenosis. POSTPROCEDURE  DIAGNOSES CULPRIT LESION: Ostial LCx 80% Successful ScoreFlex Angioplasty with DES PCI using Synergy XD 2.75 mm x 16 mm - deployed to 3.1 mm Lesion reduced to roughly 5% at the ostium but otherwise no residual stenosis, with TIMI-3 flow pre and post. 100% CTO of Native RCA with multiple overlapping stents from ostial to distal Widely patent R Radial-rPDA widely patent with antegrade filling of the downstream PDA along with retrograde filling of the RPAV and 2 PL branches Normal LV EF by LV gram, with normal LVEDP. RECOMMENDATIONS Continue home medications for CAD management. DAPT for minimum of 6 months, then would continue Plavix long-term based on ostial stent Same-day discharge and follow-up with Dr. Algis Greenhouse, MD    Disposition   Pt is being discharged home today in good condition.  Follow-up Plans & Appointments     Discharge Instructions     Amb Referral to Cardiac Rehabilitation   Complete by: As directed    Diagnosis: Coronary Stents   After initial evaluation and assessments completed: Virtual Based Care may be provided alone or in conjunction with Phase 2 Cardiac Rehab based on patient barriers.: Yes   Intensive Cardiac Rehabilitation (ICR) [place] location only OR Traditional Cardiac Rehabilitation (TCR) *If criteria for ICR are not met will enroll in TCR [facility] only): Yes        Discharge Medications   Allergies as of 11/08/2022   No Known Allergies      Medication List     STOP taking these medications    esomeprazole 20 MG capsule Commonly known as: [place] these medications    aspirin EC 81 MG tablet Take 1 tablet (81 mg total) by mouth daily. Swallow whole. What changed: additional instructions   clopidogrel 75 MG tablet Commonly known as: Plavix Take 1 tablet (75 mg total) by mouth daily.   ezetimibe 10 MG tablet Commonly known as: ZETIA Take 1 tablet by mouth daily. Please make appointment for further refills.   fenofibrate 145 MG  tablet Commonly known as: Tricor Take 1 tablet (145 mg total) by mouth daily.   isosorbide mononitrate 30 MG 24 hr tablet Commonly known as: IMDUR Take 1 tablet (30 mg total) by mouth daily.   metoprolol succinate 50 MG 24 hr tablet Commonly known as: TOPROL-XL TAKE 1 TABLET BY MOUTH EVERY DAY WITH OR IMMEDIATELY FOLLOWING A MEAL.   nitroGLYCERIN 0.4 MG SL tablet Commonly known as: NITROSTAT Place 1 tablet (0.4 mg total) under the tongue every 5 (five) minutes as needed for chest pain.   OVER THE COUNTER MEDICATION Take 6 tablets by mouth daily. Balance of nature (3) Fruit and vegetable (3)   pantoprazole 40 MG tablet Commonly known as: Protonix Take 1 tablet (40 mg total)  by mouth daily.   ramipril 10 MG capsule Commonly known as: ALTACE TAKE 1 CAPSULE BY MOUTH DAILY   ranolazine 500 MG 12 hr tablet Commonly known as: Ranexa Take 1 tablet (500 mg total) by mouth 2 (two) times daily.   rosuvastatin 5 MG tablet Commonly known as: CRESTOR TAKE 1 TABLET(5 MG) BY MOUTH AT BEDTIME   tamsulosin 0.4 MG Caps capsule Commonly known as: FLOMAX Take 0.4 mg by mouth daily.           Allergies No Known Allergies  Outstanding Labs/Studies     Duration of Discharge Encounter   Greater than 30 minutes including physician time.  Delton Coombes, PA-C 11/08/2022, 3:26 PM

## 2022-11-08 NOTE — Discharge Instructions (Signed)

## 2022-11-09 ENCOUNTER — Encounter (HOSPITAL_COMMUNITY): Payer: Self-pay | Admitting: Cardiology

## 2022-11-10 LAB — POCT ACTIVATED CLOTTING TIME
Activated Clotting Time: 227 seconds
Activated Clotting Time: 245 seconds
Activated Clotting Time: 329 seconds

## 2022-11-23 ENCOUNTER — Ambulatory Visit (HOSPITAL_COMMUNITY)
Admission: RE | Admit: 2022-11-23 | Discharge: 2022-11-23 | Disposition: A | Discharge status: Home / Self Care | Payer: Medicare HMO | Source: Ambulatory Visit | Attending: Family Medicine | Admitting: Family Medicine

## 2022-11-23 DIAGNOSIS — R918 Other nonspecific abnormal finding of lung field: Secondary | ICD-10-CM | POA: Diagnosis not present

## 2022-11-23 DIAGNOSIS — R911 Solitary pulmonary nodule: Secondary | ICD-10-CM | POA: Diagnosis not present

## 2022-11-23 DIAGNOSIS — J432 Centrilobular emphysema: Secondary | ICD-10-CM | POA: Diagnosis not present

## 2022-11-27 ENCOUNTER — Ambulatory Visit: Payer: Medicare HMO | Attending: Internal Medicine | Admitting: Internal Medicine

## 2022-11-27 ENCOUNTER — Encounter: Payer: Self-pay | Admitting: Internal Medicine

## 2022-11-27 VITALS — BP 152/72 | HR 74 | Ht 67.0 in | Wt 169.2 lb

## 2022-11-27 DIAGNOSIS — I2510 Atherosclerotic heart disease of native coronary artery without angina pectoris: Secondary | ICD-10-CM

## 2022-11-27 DIAGNOSIS — I10 Essential (primary) hypertension: Secondary | ICD-10-CM | POA: Diagnosis not present

## 2022-11-27 DIAGNOSIS — E785 Hyperlipidemia, unspecified: Secondary | ICD-10-CM | POA: Diagnosis not present

## 2022-11-27 MED ORDER — TELMISARTAN 80 MG PO TABS: 80.0000 mg | ORAL_TABLET | Freq: Every day | ORAL | 1 refills | Status: DC

## 2022-11-27 NOTE — Progress Notes (Signed)
OFFICE NOTE  Chief Complaint:  Follow-up  Primary Care Physician: Assunta Found, MD  HPI:  Jonathan [name] is a pleasant 71 year old male patient who was formerly followed by Dr. Alanda Amass. He has an extensive past medical history of coronary disease. He has had numerous prior stents to the right coronary artery and LAD and underwent brachii therapy to the LAD. Eventually developed occlusion of the right coronary artery and ultimately underwent a right free radial graft to the PDA by Dr. Laneta Simmers in 2009. He also has a history of peripheral arterial disease and underwent right common iliac and femoral artery stenting in 2003, but does have bilateral disease which has not been studied since 2009. In addition he has hypertension, GERD and is here today for preoperative cardiac evaluation. He's been having numbness and tingling in his hands as well as legs and is felt to have a cord problem in the neck. His neurosurgeon is Dr. Mikal Plane and was planning on operating yesterday however he was noted to have an abnormal EKG and was sent for preoperative evaluation. It should be noted he had a negative nuclear stress test for ischemia in 07/2013. Currently denies any chest pain. He is able to exert himself even walking up stairs without shortness of breath but is having problems with numbness and tingling in his legs as well as cool extremities.  Mr. Jonathan [name] returns today for follow-up. He underwent lumbar surgery by Dr. Mikal Plane and he reports he still has problems with leg pain and cramping. He denies any chest pain or worsening shortness of breath. Blood pressure is well-controlled today.  11/23/2016  Mr. Jonathan [name] returns today for follow-up. He is generally without complaints. He reports that he is taking 10 mg of Crestor every other day due to some leg pains. He says this is improved his symptoms somewhat. Cholesterol however is not at goal. EKG shows normal sinus rhythm. He has no chest pain or shortness of  breath. He continues to say that he's had no significant improvement in his back pain with back surgery.  11/23/2017  Mr. Jonathan [name] was seen today in follow-up.  Overall he seems to be doing well.  He reports some occasional lightheadedness but no chest pain or worsening shortness of breath.  He reports some fatigue in his legs but no significant claudication.  He did have some mild PAD in 2015 which was unchanged since 2009.  He is also overdue for a lipid profile.  He has not followed up with his primary care provider except for when he gets sick.  02/16/2021  Mr. Jonathan [name] returns today for follow-up.  Overall he seems to be doing well.  He had last seen by Azalee Course, PA-C.  He had describes some intermittent chest pain however he says he is had none since then.  He also has a history of some PAD.  He had Dopplers back in 2020, but denies any claudication.  He says he plays golf is fairly active.  He is recently doing some bowling.  He had lipids reassessed in October which showed triglycerides were normal at 172.  He is not currently taking Vascepa, but is on ezetimibe and rosuvastatin.  Blood pressure is elevated today however he chalked it up to going out bowling last night and drinking probably too much alcohol.  11/07/2022  Mr. Jonathan [name] is seen today in follow-up.  He recently had seen Eligha Bridegroom, NP, for concerns of chest pain.  He underwent a Myoview stress test which  I interpreted back in September.  This showed a large, reversible basal to apical inferior perfusion defect that partially improves with upright imaging.  I felt that there might be a component of attenuation artifact however could not rule out reversible ischemia.  Initial medical therapy was recommended and he was started on Ranexa in addition to Imdur.  He has had some minimal relief with this but has had to take nitro least 5 times since then.  He also notes that he was trying to cut down a tree limb with a hand saw and developed  chest pain and had to stop.  He has since then avoided any significant physical activity.  11/27/2022  [name] is seen today in follow-up.  He underwent left heart catheterization on November 08, 2022 which showed an 80% ostial circumflex lesion.  This was successfully stented with a drug-eluting stent and he has noted no issues since then.  Of note his blood pressure remains elevated today 152/72.  His home blood pressure readings are elevated as well.  He is also overdue for a lipid profile.  PMHx:  Past Medical History:  Diagnosis Date   Anemia    CAD (coronary artery disease)    Femoral artery stenosis (HCC)    Gastroesophageal reflux    H/O hiatal hernia    Hyperlipidemia    Hypertension    Myocardial infarction [facility]) 11/2001   PAD (peripheral artery disease) (HCC)    bilateral iliac artery stents    PONV (postoperative nausea and vomiting)    most of the time he gets nauseated   Prostate hyperplasia with urinary obstruction    S/P CABG x 1 2009   R radial to PDA   Tobacco consumption    quit cigarettes in 2008, smokes 5-6 cigars/week    Past Surgical History:  Procedure Laterality Date   ANTERIOR CERVICAL DECOMP/DISCECTOMY FUSION N/A 10/22/2014   Procedure: CERVICAL SIX SEVEN ANTERIOR CERVICAL DECOMPRESSION/DISCECTOMY FUSION  ;  Surgeon: Coletta Memos, MD;  Location: [facility];  Service: Neurosurgery;  Laterality: N/A;  C67 Possible C7T1 anterior cervical decompression with fusion plating and bonegraft   BACK SURGERY     CARDIAC CATHETERIZATION  12/03/2001   2.5x16mm S-660 stent to RCA (MI)   CARDIAC CATHETERIZATION  05/06/2002   PTCA & stenting of LAD and PTCA & stenting of mid/distal RCA with 3.0x36mm Zeta stent   CARDIAC CATHETERIZATION  06/03/2002   LAD with prox 30% in-stent restenosis; ramus with 95% ostial lesion; L Cfx & 3-OMs free of disease; mild 30% RCA narrowing & 20-30% in-stent restenosis in RCA (Dr. Laurell Josephs)   CARDIAC CATHETERIZATION  03/24/2003    overlapping tandem DES 3.0x23 Cypher stent to mid RCA for in-stent restenosis & new lesion beyond previous stent with 3.0x12 SciMed TAXUS (Dr. Jonette Eva)   CARDIAC CATHETERIZATION  12/08/2003   3.0x24 TAXUS overlapping 3.0x58mm TAXUS DES in RCA and cuttin balloon atherectomy (Dr. Jonette Eva)   CARDIAC CATHETERIZATION  07/10/2007   normal L main, L Cfx & 3-OMs free of disease; LAD with Zeta stent in prox region (patent); RCA with multiple stents (3.0x12 Taxus in ostium with 3.0x20 Taxus overlapping it, and series of 3.0x24 Tacus and Cypher stent overlapping this, and in distal RC 2x5x86mm Zeta(?) stent; ostium of RCA with 80% narrowing - 3x15 cutting balloon - 80% to patient w/NO obstruction (Dr. Mervyn Skeeters. Little)   CARDIAC CATHETERIZATION  06/08/2008   high-grade osital & prox in-stent RCA restenosis - Dr. Jonette Eva  COLONOSCOPY N/A 11/22/2015   Procedure: COLONOSCOPY;  Surgeon: Corbin Ade, MD;  Location: [facility];  Service: Endoscopy;  Laterality: N/A;  100   CORONARY ARTERY BYPASS GRAFT  2009   free right radial to PDA for progressive RCA disease & multiple re-stenosis (Dr. Wayland Salinas)   CORONARY STENT INTERVENTION N/A 11/08/2022   Procedure: CORONARY STENT INTERVENTION;  Surgeon: Marykay Lex, MD;  Location: [facility] INVASIVE CV LAB;  Service: Cardiovascular;  Laterality: N/A;   ILIAC ARTERY STENT Bilateral 01/30/2002   9x51mm Cordis SmartStent to LCIA, 9x3.79mm Genesis stent to RCIA (Dr. Erlene Quan)   LEFT HEART CATH AND CORS/GRAFTS ANGIOGRAPHY N/A 11/08/2022   Procedure: LEFT HEART CATH AND CORS/GRAFTS ANGIOGRAPHY;  Surgeon: Marykay Lex, MD;  Location: [facility] INVASIVE CV LAB;  Service: Cardiovascular;  Laterality: N/A;   Lower Extremity Arterial Doppler  2009   R CIA stent - less than 50% diameter reduction; R CFA stent - 0-49% diameter reduction; R SFA less than 50% diameter reduction; L CFA, profunda, SFA - equal/less than 50% diameter reduction   LUMBAR LAMINECTOMY/DECOMPRESSION  MICRODISCECTOMY Bilateral 03/18/2015   Procedure: LUMBAR LAMINECTOMY/DECOMPRESSION MICRODISCECTOMY 2 LEVELS;  Surgeon: Coletta Memos, MD;  Location: [facility];  Service: Neurosurgery;  Laterality: Bilateral;  Bilateral L34 L45 laminectomies   NM MYOCAR PERF WALL MOTION  07/2013   bruce myoview - intermediate risk test with abnormal inferior horizontal ST depression; mild, fixed inferobasal defect, but no ischemia   TRANSTHORACIC ECHOCARDIOGRAM  07/2013   EF 55-60%, mild LVH; mild MR; LA mildly dilated; RVSP increased; PA peak pressure (mod pulm HTN)    FAMHx:  Family History  Problem Relation Age of Onset   Heart disease Father    CAD Brother        CABG   CAD Brother        CABG   Colon cancer Neg Hx     SOCHx:   reports that he quit smoking about 3 months ago. His smoking use included cigarettes. He has a 6.00 pack-year smoking history. He has never used smokeless tobacco. He reports current alcohol use of about 12.0 standard drinks of alcohol per week. He reports that he does not use drugs.  ALLERGIES:  No Known Allergies  ROS: Pertinent items noted in HPI and remainder of comprehensive ROS otherwise negative.  HOME MEDS: Current Outpatient Medications  Medication Sig Dispense Refill   aspirin EC 81 MG tablet Take 1 tablet (81 mg total) by mouth daily. Swallow whole. 30 tablet 5   clopidogrel (PLAVIX) 75 MG tablet Take 1 tablet (75 mg total) by mouth daily. 30 tablet 5   ezetimibe (ZETIA) 10 MG tablet Take 1 tablet by mouth daily. Please make appointment for further refills. 30 tablet 8   fenofibrate (TRICOR) 145 MG tablet Take 1 tablet (145 mg total) by mouth daily. 90 tablet 3   isosorbide mononitrate (IMDUR) 30 MG 24 hr tablet Take 1 tablet (30 mg total) by mouth daily. 90 tablet 3   metoprolol succinate (TOPROL-XL) 50 MG 24 hr tablet TAKE 1 TABLET BY MOUTH EVERY DAY WITH OR IMMEDIATELY FOLLOWING A MEAL. 90 tablet 3   nitroGLYCERIN (NITROSTAT) 0.4 MG SL tablet Place 1  tablet (0.4 mg total) under the tongue every 5 (five) minutes as needed for chest pain. 25 tablet 3   OVER THE COUNTER MEDICATION Take 6 tablets by mouth daily. Balance of nature (3) Fruit and vegetable (3)     pantoprazole (PROTONIX) 40 MG  tablet Take 1 tablet (40 mg total) by mouth daily. 30 tablet 1   ramipril (ALTACE) 10 MG capsule TAKE 1 CAPSULE BY MOUTH DAILY 90 capsule 3   ranolazine (RANEXA) 500 MG 12 hr tablet Take 1 tablet (500 mg total) by mouth 2 (two) times daily. 60 tablet 11   rosuvastatin (CRESTOR) 5 MG tablet TAKE 1 TABLET(5 MG) BY MOUTH AT BEDTIME 90 tablet 2   tamsulosin (FLOMAX) 0.4 MG CAPS capsule Take 0.4 mg by mouth daily.     No current facility-administered medications for this visit.    LABS/IMAGING: No results found for this or any previous visit (from the past 48 hour(s)). No results found.  VITALS: BP (!) 152/72 (BP Location: Left Arm, Patient Position: Sitting, Cuff Size: Normal)   Pulse 74   Ht 5\' 7"  (1.702 m)   Wt 169 lb 3.2 oz (76.7 kg)   SpO2 97%   BMI 26.50 kg/m   EXAM: General appearance: alert and no distress Neck: no carotid bruit and no JVD Lungs: clear to auscultation bilaterally Heart: regular rate and rhythm, S1, S2 normal, no murmur, click, rub or gallop Abdomen: soft, non-tender; bowel sounds normal; no masses,  no organomegaly Extremities: cool to touch, diminished pulses no edema Pulses: 1+ Skin: pale, cool, dry Neurologic: Mental status: Alert, oriented, thought content appropriate Psych: Normal  EKG: Deferred  ASSESSMENT: Unstable angina -status post left heart cath with PCI to the ostial circumflex (10/2022) Abnormal Myoview stress test with reversible inferior ischemia Coronary artery disease status post single vessel CABG in 2009 (free right radial to PDA) Numerous prior coronary interventions to the RCA and LAD with brachii therapy PAD status post right common iliac and right superficial femoral artery stents in  2003 Hypertension Dyslipidemia, goal LDL less than 70  PLAN: 1.   Mr. Buenafe is doing much better after intervention to the proximal circumflex.  He needs better blood pressure control.  I would advise switching his ACE inhibitor over to an ARB (telmisartan 80 mg daily) for better blood pressure control and have him take home readings.  He is also due for repeat lipid profile.  Will need to continue to optimize that with a target LDL less than 70.  As he is now revascularized, I think we can stop his Ranexa today.  We may be able to stop his long-acting nitrate in a few months.  Plan follow-up with me in 4 months.  [name], MD, [facility], FACP  [place]  [facility]  Medical Director of the [facility]  [facility] Attending Cardiologist  Direct Dial: [phone]  Fax: [phone]  Website:  [email or web address] Nicely Laniqua Torrens 11/27/2022, 9:12 AM

## 2022-11-27 NOTE — Patient Instructions (Signed)
Medication Instructions:   STOP: RAMIPRIL   STOP RANEXA   START: TELMISARTAN '80mg'$  ONCE DAILY   *If you need a refill on your cardiac medications before your next appointment, please call your pharmacy*  Lab Work: BLOOD WORK TODAY  If you have labs (blood work) drawn today and your tests are completely normal, you will receive your results only by: [place] (if you have MyChart) OR A paper copy in the mail If you have any lab test that is abnormal or we need to change your treatment, we will call you to review the results.  Testing/Procedures: None Ordered At This Time.   Follow-Up: At [facility], you and your health needs are our priority.  As part of our continuing mission to provide you with exceptional heart care, we have created designated Provider Care Teams.  These Care Teams include your primary Cardiologist (physician) and Advanced Practice Providers (APPs -  Physician Assistants and Nurse Practitioners) who all work together to provide you with the care you need, when you need it.  Your next appointment:   4 month(s)  The format for your next appointment:   In Person  Provider:   Pixie Casino, MD

## 2022-11-28 LAB — LIPOPROTEIN A (LPA): Lipoprotein (a): 41.3 nmol/L (ref <75.0)

## 2022-11-28 LAB — LIPID PANEL
Chol/HDL Ratio: 3.6 ratio (ref 0.0–5.0)
Cholesterol, Total: 143 mg/dL (ref 100–199)
HDL: 40 mg/dL (ref >39)
LDL Chol Calc (NIH): 81 mg/dL (ref 0–99)
Triglycerides: 119 mg/dL (ref 0–149)
VLDL Cholesterol Cal: 22 mg/dL (ref 5–40)

## 2022-11-29 ENCOUNTER — Other Ambulatory Visit: Payer: Self-pay | Admitting: *Deleted

## 2022-11-29 DIAGNOSIS — I2510 Atherosclerotic heart disease of native coronary artery without angina pectoris: Secondary | ICD-10-CM

## 2022-11-29 DIAGNOSIS — E785 Hyperlipidemia, unspecified: Secondary | ICD-10-CM

## 2022-11-29 MED ORDER — ROSUVASTATIN CALCIUM 10 MG PO TABS: 10.0000 mg | ORAL_TABLET | Freq: Every day | ORAL | 3 refills | Status: DC

## 2022-12-13 ENCOUNTER — Other Ambulatory Visit: Payer: Self-pay | Admitting: *Deleted

## 2022-12-13 DIAGNOSIS — E785 Hyperlipidemia, unspecified: Secondary | ICD-10-CM

## 2022-12-15 DIAGNOSIS — B338 Other specified viral diseases: Secondary | ICD-10-CM | POA: Diagnosis not present

## 2023-01-22 ENCOUNTER — Other Ambulatory Visit: Payer: Self-pay | Admitting: *Deleted

## 2023-01-22 DIAGNOSIS — I2510 Atherosclerotic heart disease of native coronary artery without angina pectoris: Secondary | ICD-10-CM

## 2023-01-22 MED ORDER — CLOPIDOGREL BISULFATE 75 MG PO TABS: 75.0000 mg | ORAL_TABLET | Freq: Every day | ORAL | 11 refills | Status: DC

## 2023-01-22 MED ORDER — EZETIMIBE 10 MG PO TABS: 10.0000 mg | ORAL_TABLET | Freq: Every day | ORAL | 11 refills | Status: DC

## 2023-03-16 ENCOUNTER — Ambulatory Visit: Payer: PPO | Attending: Internal Medicine | Admitting: Nurse Practitioner

## 2023-03-16 ENCOUNTER — Encounter: Payer: Self-pay | Admitting: Nurse Practitioner

## 2023-03-16 VITALS — BP 140/52 | HR 72 | Ht 67.0 in | Wt 183.0 lb

## 2023-03-16 DIAGNOSIS — I739 Peripheral vascular disease, unspecified: Secondary | ICD-10-CM | POA: Diagnosis not present

## 2023-03-16 DIAGNOSIS — I10 Essential (primary) hypertension: Secondary | ICD-10-CM

## 2023-03-16 DIAGNOSIS — I2510 Atherosclerotic heart disease of native coronary artery without angina pectoris: Secondary | ICD-10-CM

## 2023-03-16 DIAGNOSIS — Z87891 Personal history of nicotine dependence: Secondary | ICD-10-CM | POA: Diagnosis not present

## 2023-03-16 DIAGNOSIS — E785 Hyperlipidemia, unspecified: Secondary | ICD-10-CM | POA: Diagnosis not present

## 2023-03-16 MED ORDER — ISOSORBIDE MONONITRATE ER 60 MG PO TB24: 60.0000 mg | ORAL_TABLET | Freq: Every day | ORAL | 3 refills | Status: DC

## 2023-03-16 NOTE — Progress Notes (Unsigned)
Office Visit    Patient Name: Jonathan [name] Date of Encounter: 03/16/2023  Primary Care Provider:  Sharilyn Sites, MD Primary Cardiologist:  Pixie Casino, MD  Chief Complaint    72 year old male with a history of CAD, s/p numerous prior stents to the LAD and RCA, s/p CABG x 1 in 2009, PAD s/p iliac and femoral artery stenting in 2003, hypertension, hyperlipidemia, GERD and tobacco use who presents for follow-up related to CAD.  Past Medical History    Past Medical History:  Diagnosis Date   Anemia    CAD (coronary artery disease)    Femoral artery stenosis (HCC)    Gastroesophageal reflux    H/O hiatal hernia    Hyperlipidemia    Hypertension    Myocardial infarction [facility]) 11/2001   PAD (peripheral artery disease) ([place])    bilateral iliac artery stents    PONV (postoperative nausea and vomiting)    most of the time he gets nauseated   Prostate hyperplasia with urinary obstruction    S/P CABG x 1 2009   R radial to PDA   Tobacco consumption    quit cigarettes in 2008, smokes 5-6 cigars/week   Past Surgical History:  Procedure Laterality Date   ANTERIOR CERVICAL DECOMP/DISCECTOMY FUSION N/A 10/22/2014   Procedure: CERVICAL SIX SEVEN ANTERIOR CERVICAL DECOMPRESSION/DISCECTOMY FUSION  ;  Surgeon: Ashok Pall, MD;  Location: [place] [facility];  Service: Neurosurgery;  Laterality: N/A;  C67 Possible C7T1 anterior cervical decompression with fusion plating and bonegraft   BACK SURGERY     CARDIAC CATHETERIZATION  12/03/2001   2.5x78mm S-660 stent to RCA (MI)   CARDIAC CATHETERIZATION  05/06/2002   PTCA & stenting of LAD and PTCA & stenting of mid/distal RCA with 3.0x54mm Zeta stent   CARDIAC CATHETERIZATION  06/03/2002   LAD with prox 30% in-stent restenosis; ramus with 95% ostial lesion; L Cfx & 3-OMs free of disease; mild 30% RCA narrowing & 20-30% in-stent restenosis in RCA (Dr. Gerrie Nordmann)   [place]  03/24/2003   overlapping tandem DES 3.0x23 Cypher stent to  mid RCA for in-stent restenosis & new lesion beyond previous stent with 3.0x12 SciMed TAXUS (Dr. Marella Chimes)   [place]  12/08/2003   3.0x24 TAXUS overlapping 3.0x30mm TAXUS DES in RCA and cuttin balloon atherectomy (Dr. Marella Chimes)   CARDIAC CATHETERIZATION  07/10/2007   normal L main, L Cfx & 3-OMs free of disease; LAD with Zeta stent in prox region (patent); RCA with multiple stents (3.0x12 Taxus in ostium with 3.0x20 Taxus overlapping it, and series of 3.0x24 Tacus and Cypher stent overlapping this, and in distal RC 2x5x96mm Zeta(?) stent; ostium of RCA with 80% narrowing - 3x15 cutting balloon - 80% to patient w/NO obstruction (Dr. Loni Muse. Little)   [place]  06/08/2008   high-grade osital & prox in-stent RCA restenosis - Dr. Marella Chimes   COLONOSCOPY N/A 11/22/2015   Procedure: COLONOSCOPY;  Surgeon: Daneil Dolin, MD;  [facility];  Service: Endoscopy;  Laterality: N/A;  100   CORONARY ARTERY BYPASS GRAFT  2009   free right radial to PDA for progressive RCA disease & multiple re-stenosis (Dr. Ellison Hughs)   CORONARY STENT INTERVENTION N/A 11/08/2022   Procedure: CORONARY STENT INTERVENTION;  Surgeon: Leonie Man, MD;  Location: [place] [facility] LAB;  Service: Cardiovascular;  Laterality: N/A;   ILIAC ARTERY STENT Bilateral 01/30/2002   9x57mm Cordis SmartStent to LCIA, 9x3.75mm Genesis stent to RCIA (Dr. Adora Fridge)  LEFT HEART CATH AND CORS/GRAFTS ANGIOGRAPHY N/A 11/08/2022   Procedure: LEFT HEART CATH AND CORS/GRAFTS ANGIOGRAPHY;  Surgeon: Leonie Man, MD;  Location: [place] [facility];  Service: Cardiovascular;  Laterality: N/A;   Lower Extremity Arterial Doppler  2009   R CIA stent - less than 50% diameter reduction; R CFA stent - 0-49% diameter reduction; R SFA less than 50% diameter reduction; L CFA, profunda, SFA - equal/less than 50% diameter reduction   LUMBAR LAMINECTOMY/DECOMPRESSION MICRODISCECTOMY Bilateral 03/18/2015   Procedure:  LUMBAR LAMINECTOMY/DECOMPRESSION MICRODISCECTOMY 2 LEVELS;  Surgeon: Ashok Pall, MD;  Location: [place] [facility] OR[facility];  Service: Neurosurgery;  Laterality: Bilateral;  Bilateral L34 L45 laminectomies   NM MYOCAR PERF WALL MOTION  07/2013   bruce myoview - intermediate risk test with abnormal inferior horizontal ST depression; mild, fixed inferobasal defect, but no ischemia   TRANSTHORACIC ECHOCARDIOGRAM  07/2013   EF 55-60%, mild LVH; mild MR; LA mildly dilated; RVSP increased; PA peak pressure 9mmHg (mod pulm HTN)    Allergies  No Known Allergies   Labs/Other Studies Reviewed    The following studies were reviewed today: Lexiscan myoview 08/2022:   Findings are equivocal, suggestive of possible diaphragmatic attenuation, but also concerning for ischemia. The study is intermediate risk.   No ST deviation was noted.   LV perfusion is abnormal. Defect 1: There is a medium defect with moderate reduction in uptake present in the apical to basal inferior location(s) that is partially reversible. There is normal wall motion in the defect area. Consistent with artifact caused by subdiaphragmatic activity.   Left ventricular function is normal. Nuclear stress EF: 71 %. The left ventricular ejection fraction is hyperdynamic (>65%). End diastolic cavity size is normal. End systolic cavity size is normal.   Prior study available for comparison from 08/19/2013. No changes compared to prior study. No ischemia, bowel attenuation artifact, normal LVEF at 66%   Large, reversible basal to apical inferior perfusion defect, which partially improves with stress upright imaging, suggestive of a component of diaphragmatic attenuation artifact. Cannot exclude reversible ischemia. LVEF 71% with normal wall motion. This is an intermediate risk study. Consider further ischemic evaluation if warranted.  LHC 10/2022:    Ost Cx lesion is 80% stenosed.   Scoring balloon angioplasty was performed using a BALLN SCOREFLEX  2.75X10.   A drug-eluting stent was successfully placed using a SYNERGY XD 2.75X16.  Deployed to 2.9 mm and postoperative 3.1 mm at the ostium and proximal lesion.   Post intervention, there is a 5% residual stenosis.   ------------------------------------------------   Minimal diffuse disease in the LAD and very small caliber Ramus   Ost RCA to Dist RCA lesion is 100% stenosed. ->  Extensive stents completely stenosed.  Flush occlusion.   Right Radial Artery graft (Rad-rPDA) was visualized by angiography and is normal in caliber. The graft exhibits no disease. There is no competitive flow   ------------------------------------------------   The left ventricular systolic function is normal.  The left ventricular ejection fraction is 55-65% by visual estimate.   LV end diastolic pressure is normal.  There is no aortic valve stenosis.   POSTPROCEDURE DIAGNOSES CULPRIT LESION: Ostial LCx 80% Successful ScoreFlex Angioplasty with DES PCI using Synergy XD 2.75 mm x 16 mm - deployed to 3.1 mm Lesion reduced to roughly 5% at the ostium but otherwise no residual stenosis, with TIMI-3 flow pre and post. 100% CTO of Native RCA with multiple overlapping stents from ostial to distal Widely patent R Radial-rPDA widely  patent with antegrade filling of the downstream PDA along with retrograde filling of the RPAV and 2 PL branches Normal LV EF by LV gram, with normal LVEDP.      RECOMMENDATIONS Continue home medications for CAD management. DAPT for minimum of 6 months, then would continue Plavix long-term based on ostial stent Same-day discharge and follow-up with Dr. Debara Pickett    Recent Labs: 08/22/2022: ALT 21 11/07/2022: BUN 14; Creatinine, Ser 1.10; Hemoglobin 13.1; Potassium 5.2; Sodium 135 11/08/2022: Platelets 206  Recent Lipid Panel    Component Value Date/Time   CHOL 143 11/27/2022 0942   TRIG 119 11/27/2022 0942   HDL 40 11/27/2022 0942   CHOLHDL 3.6 11/27/2022 0942   CHOLHDL 4.9 11/21/2016  0925   VLDL 61 (H) 11/21/2016 0925   LDLCALC 81 11/27/2022 0942    History of Present Illness    72 year old male with the above past medical history including CAD, s/p numerous prior stents to the LAD and RCA, s/p CABG x 1 in 2009, PAD s/p iliac and femoral artery stenting in 2003, hypertension, hyperlipidemia, GERD and tobacco use.  Previously followed with Dr. Rollene Fare.  He has an extensive history of CAD as above, he underwent brachytherapy to the LAD.  Eventually he developed occlusion of the right coronary artery and ultimately underwent a right free radial graft to the PDA by Dr. Caffie Pinto in 2009.  He had an abnormal stress test in September 2023.  Most recent ABIs in 07/2019 were stable.  Follow-up cardiac catheterization in 10/2022 revealed 80% ostial circumflex occlusion s/p DES, patent R PDA graft.  He was last seen in the office on 11/27/2022 and was stable from a cardiac standpoint.  He denied symptoms concerning for angina.  His BP was elevated.  ACE inhibitor was switched to telmisartan 80 mg daily. Ranexa was discontinued.   He presents today for follow-up. Since his last visit he notes a 35-month history of intermittent chest discomfort that occurs both at rest and with activity, relieved with nitroglycerin.  He states that he has had the symptoms since the time of his stent procedure though there is no documented chest discomfort from his last visit with Dr. Debara Pickett.  He also notes that he has been taking only half of his medication every day.  He is still taking Ranexa.  He is a difficult historian.  Cardiac catheterization November 2023 was overall reassuring.  Will try increasing Imdur to 60 mg daily.  I encouraged adherence to his medication regimen.  He will have repeat fasting lipids drawn as he is overdue for this.  Will stop Ranexa.  Will discuss with Dr. Debara Pickett whether or not he recommends any additional testing (i.e. cardiac PET stress test).  Will defer at this time.  Follow-up in 2  to 3 months with [facility], follow-up in 6 months with Dr. Debara Pickett.  CAD: PAD: Hypertension: Hyperlipidemia: Tobacco use: He is no longer smoking.  Disposition: Follow-up in 2-3 months with [facility], follow-up with Dr. Debara Pickett in 6 months    Home Medications    Current Outpatient Medications  Medication Sig Dispense Refill   aspirin EC 81 MG tablet Take 1 tablet (81 mg total) by mouth daily. Swallow whole. 30 tablet 5   clopidogrel (PLAVIX) 75 MG tablet Take 1 tablet (75 mg total) by mouth daily. 30 tablet 11   ezetimibe (ZETIA) 10 MG tablet Take 1 tablet (10 mg total) by mouth daily. Take 1 tablet by mouth daily. 30 tablet 11   fenofibrate (TRICOR)  145 MG tablet Take 1 tablet (145 mg total) by mouth daily. 90 tablet 3   isosorbide mononitrate (IMDUR) 60 MG 24 hr tablet Take 1 tablet (60 mg total) by mouth daily. 90 tablet 3   metoprolol succinate (TOPROL-XL) 50 MG 24 hr tablet TAKE 1 TABLET BY MOUTH EVERY DAY WITH OR IMMEDIATELY FOLLOWING A MEAL. 90 tablet 3   OVER THE COUNTER MEDICATION Take 6 tablets by mouth daily. Balance of nature (3) Fruit and vegetable (3)     pantoprazole (PROTONIX) 40 MG tablet Take 1 tablet (40 mg total) by mouth daily. 30 tablet 1   rosuvastatin (CRESTOR) 10 MG tablet Take 1 tablet (10 mg total) by mouth daily. 90 tablet 3   tamsulosin (FLOMAX) 0.4 MG CAPS capsule Take 0.4 mg by mouth daily.     telmisartan (MICARDIS) 80 MG tablet Take 1 tablet (80 mg total) by mouth daily. 90 tablet 1   nitroGLYCERIN (NITROSTAT) 0.4 MG SL tablet Place 1 tablet (0.4 mg total) under the tongue every 5 (five) minutes as needed for chest pain. 25 tablet 3   No current facility-administered medications for this visit.     Review of Systems    He denies palpitations, dyspnea, pnd, orthopnea, n, v, dizziness, syncope, edema, weight gain, or early satiety. All other systems reviewed and are otherwise negative except as noted above.   Physical Exam    VS:  BP (!) 140/52   Pulse 72   Ht 5'  7" (1.702 m)   Wt 183 lb (83 kg)   SpO2 97%   BMI 28.66 kg/m   GEN: Well nourished, well developed, in no acute distress. HEENT: normal. Neck: Supple, no JVD, carotid bruits, or masses. Cardiac: RRR, no murmurs, rubs, or gallops. No clubbing, cyanosis, edema.  Radials/DP/PT 2+ and equal bilaterally.  Respiratory:  Respirations regular and unlabored, clear to auscultation bilaterally. GI: Soft, nontender, nondistended, BS + x 4. MS: no deformity or atrophy. Skin: warm and dry, no rash. Neuro:  Strength and sensation are intact. Psych: Normal affect.  Accessory Clinical Findings    ECG personally reviewed by me today -NSR, 70 bpm- no acute changes.   Lab Results  Component Value Date   WBC 6.7 11/07/2022   HGB 13.1 11/07/2022   HCT 38.4 11/07/2022   MCV 90 11/07/2022   PLT 206 11/08/2022   Lab Results  Component Value Date   CREATININE 1.10 11/07/2022   BUN 14 11/07/2022   NA 135 11/07/2022   K 5.2 11/07/2022   CL 101 11/07/2022   CO2 24 11/07/2022   Lab Results  Component Value Date   ALT 21 08/22/2022   AST 18 08/22/2022   ALKPHOS 77 08/22/2022   BILITOT 0.5 08/22/2022   Lab Results  Component Value Date   CHOL 143 11/27/2022   HDL 40 11/27/2022   LDLCALC 81 11/27/2022   TRIG 119 11/27/2022   CHOLHDL 3.6 11/27/2022    Lab Results  Component Value Date   HGBA1C  06/11/2008    5.4 (NOTE)   The ADA recommends the following therapeutic goals for glycemic   control related to Hgb A1C measurement:   Goal of Therapy:   < 7.0% Hgb A1C   Action Suggested:  > 8.0% Hgb A1C   Ref:  Diabetes Care, 22, Suppl. 1, 1999    Assessment & Plan    1.  ***   Lenna Sciara, NP 03/16/2023, 4:20 PM

## 2023-03-16 NOTE — Patient Instructions (Addendum)
Medication Instructions:  Stop Ranexa as directed Increase Imdur 60 mg daily  *If you need a refill on your cardiac medications before your next appointment, please call your pharmacy*   Lab Work: NONE ordered at this time of appointment   If you have labs (blood work) drawn today and your tests are completely normal, you will receive your results only by: [place] (if you have MyChart) OR A paper copy in the mail If you have any lab test that is abnormal or we need to change your treatment, we will call you to review the results.   Testing/Procedures: NONE ordered at this time of appointment     Follow-Up: At [facility], you and your health needs are our priority.  As part of our continuing mission to provide you with exceptional heart care, we have created designated Provider Care Teams.  These Care Teams include your primary Cardiologist (physician) and Advanced Practice Providers (APPs -  Physician Assistants and Nurse Practitioners) who all work together to provide you with the care you need, when you need it.  We recommend signing up for the patient portal called "MyChart".  Sign up information is provided on this After Visit Summary.  MyChart is used to connect with patients for Virtual Visits (Telemedicine).  Patients are able to view lab/test results, encounter notes, upcoming appointments, etc.  Non-urgent messages can be sent to your provider as well.   To learn more about what you can do with MyChart, go to [email or web address].    Your next appointment:   2-3 month(s) Diona Browner NP) Next available (Dr. Geralyn Corwin)  Provider:   Diona Browner, NP        Other Instructions

## 2023-03-18 ENCOUNTER — Encounter: Payer: Self-pay | Admitting: Nurse Practitioner

## 2023-04-09 ENCOUNTER — Other Ambulatory Visit: Payer: Self-pay | Admitting: Nurse Practitioner

## 2023-04-09 ENCOUNTER — Other Ambulatory Visit: Payer: Self-pay | Admitting: Internal Medicine

## 2023-04-12 ENCOUNTER — Ambulatory Visit: Payer: Medicare HMO | Admitting: Internal Medicine

## 2023-06-08 ENCOUNTER — Encounter: Payer: Self-pay | Admitting: Nurse Practitioner

## 2023-06-08 ENCOUNTER — Ambulatory Visit: Payer: PPO | Attending: Nurse Practitioner | Admitting: Nurse Practitioner

## 2023-06-08 VITALS — BP 132/68 | HR 60 | Ht 67.0 in | Wt 176.0 lb

## 2023-06-08 DIAGNOSIS — I25118 Atherosclerotic heart disease of native coronary artery with other forms of angina pectoris: Secondary | ICD-10-CM | POA: Diagnosis not present

## 2023-06-08 DIAGNOSIS — I10 Essential (primary) hypertension: Secondary | ICD-10-CM | POA: Diagnosis not present

## 2023-06-08 DIAGNOSIS — I739 Peripheral vascular disease, unspecified: Secondary | ICD-10-CM | POA: Diagnosis not present

## 2023-06-08 DIAGNOSIS — E785 Hyperlipidemia, unspecified: Secondary | ICD-10-CM

## 2023-06-08 MED ORDER — NITROGLYCERIN 0.4 MG SL SUBL: 0.4000 mg | SUBLINGUAL_TABLET | SUBLINGUAL | 5 refills | Status: DC | PRN

## 2023-06-08 MED ORDER — ISOSORBIDE MONONITRATE ER 60 MG PO TB24: 90.0000 mg | ORAL_TABLET | Freq: Every day | ORAL | 3 refills | Status: DC

## 2023-06-08 MED ORDER — EZETIMIBE 10 MG PO TABS
10.0000 mg | ORAL_TABLET | Freq: Every day | ORAL | 3 refills | Status: AC
Start: 2023-06-08 — End: ?

## 2023-06-08 NOTE — Patient Instructions (Addendum)
Medication Instructions:   Increase Imdur to 90 mg  daily   ( 1 and 1/2 tablet of 60 mg )    *If you need a refill on your cardiac medications before your next appointment, please call your pharmacy*   Lab Work: Lipid Hepatic panel  If you have labs (blood work) drawn today and your tests are completely normal, you will receive your results only by: MyChart Message (if you have MyChart) OR A paper copy in the mail If you have any lab test that is abnormal or we need to change your treatment, we will call you to review the results.   Testing/Procedures: Not needed   Follow-Up: At [facility], you and your health needs are our priority.  As part of our continuing mission to provide you with exceptional heart care, we have created designated Provider Care Teams.  These Care Teams include your primary Cardiologist (physician) and Advanced Practice Providers (APPs -  Physician Assistants and Nurse Practitioners) who all work together to provide you with the care you need, when you need it.     Your next appointment:   6 month(s)  The format for your next appointment:   In Person  Provider:   [name], MD

## 2023-06-08 NOTE — Progress Notes (Signed)
Office Visit    Patient Name: MUNEER GURGANIOUS Date of Encounter: 06/08/2023  Primary Care Provider:  Assunta Found, MD Primary Cardiologist:  [name], MD  Chief Complaint    72 year old male with a history of CAD, s/p numerous prior stents to the LAD and RCA, s/p CABG x 1 in 2009, DES-oLCx in 2023, PAD s/p iliac and femoral artery stenting in 2003, hypertension, hyperlipidemia, GERD and tobacco use who presents for follow-up related to CAD.   Past Medical History    Past Medical History:  Diagnosis Date   Anemia    CAD (coronary artery disease)    Femoral artery stenosis (HCC)    Gastroesophageal reflux    H/O hiatal hernia    Hyperlipidemia    Hypertension    Myocardial infarction [facility]) 11/2001   PAD (peripheral artery disease) (HCC)    bilateral iliac artery stents    PONV (postoperative nausea and vomiting)    most of the time he gets nauseated   Prostate hyperplasia with urinary obstruction    S/P CABG x 1 2009   R radial to PDA   Tobacco consumption    quit cigarettes in 2008, smokes 5-6 cigars/week   Past Surgical History:  Procedure Laterality Date   ANTERIOR CERVICAL DECOMP/DISCECTOMY FUSION N/A 10/22/2014   Procedure: CERVICAL SIX SEVEN ANTERIOR CERVICAL DECOMPRESSION/DISCECTOMY FUSION  ;  Surgeon: Coletta Memos, MD;  Location: [facility] OR[facility];  Service: Neurosurgery;  Laterality: N/A;  C67 Possible C7T1 anterior cervical decompression with fusion plating and bonegraft   BACK SURGERY     CARDIAC CATHETERIZATION  12/03/2001   2.5x12mm S-660 stent to RCA (MI)   CARDIAC CATHETERIZATION  05/06/2002   PTCA & stenting of LAD and PTCA & stenting of mid/distal RCA with 3.0x40mm Zeta stent   CARDIAC CATHETERIZATION  06/03/2002   LAD with prox 30% in-stent restenosis; ramus with 95% ostial lesion; L Cfx & 3-OMs free of disease; mild 30% RCA narrowing & 20-30% in-stent restenosis in RCA (Dr. Laurell Josephs)   CARDIAC CATHETERIZATION  03/24/2003   overlapping tandem DES  3.0x23 Cypher stent to mid RCA for in-stent restenosis & new lesion beyond previous stent with 3.0x12 SciMed TAXUS (Dr. Jonette Eva)   CARDIAC CATHETERIZATION  12/08/2003   3.0x24 TAXUS overlapping 3.0x34mm TAXUS DES in RCA and cuttin balloon atherectomy (Dr. Jonette Eva)   CARDIAC CATHETERIZATION  07/10/2007   normal L main, L Cfx & 3-OMs free of disease; LAD with Zeta stent in prox region (patent); RCA with multiple stents (3.0x12 Taxus in ostium with 3.0x20 Taxus overlapping it, and series of 3.0x24 Tacus and Cypher stent overlapping this, and in distal RC 2x5x85mm Zeta(?) stent; ostium of RCA with 80% narrowing - 3x15 cutting balloon - 80% to patient w/NO obstruction (Dr. Mervyn Skeeters. Little)   CARDIAC CATHETERIZATION  06/08/2008   high-grade osital & prox in-stent RCA restenosis - Dr. Jonette Eva   COLONOSCOPY N/A 11/22/2015   Procedure: COLONOSCOPY;  Surgeon: Corbin Ade, MD;  Location: [facility];  Service: Endoscopy;  Laterality: N/A;  100   CORONARY ARTERY BYPASS GRAFT  2009   free right radial to PDA for progressive RCA disease & multiple re-stenosis (Dr. Wayland Salinas)   CORONARY STENT INTERVENTION N/A 11/08/2022   Procedure: CORONARY STENT INTERVENTION;  Surgeon: Marykay Lex, MD;  Location: [facility] INVASIVE CV LAB;  Service: Cardiovascular;  Laterality: N/A;   ILIAC ARTERY STENT Bilateral 01/30/2002   9x41mm Cordis SmartStent to LCIA, 9x3.69mm Genesis stent to  RCIA (Dr. Erlene Quan)   LEFT HEART CATH AND CORS/GRAFTS ANGIOGRAPHY N/A 11/08/2022   Procedure: LEFT HEART CATH AND CORS/GRAFTS ANGIOGRAPHY;  Surgeon: Marykay Lex, MD;  Location: [facility] INVASIVE CV LAB;  Service: Cardiovascular;  Laterality: N/A;   Lower Extremity Arterial Doppler  2009   R CIA stent - less than 50% diameter reduction; R CFA stent - 0-49% diameter reduction; R SFA less than 50% diameter reduction; L CFA, profunda, SFA - equal/less than 50% diameter reduction   LUMBAR LAMINECTOMY/DECOMPRESSION MICRODISCECTOMY Bilateral  03/18/2015   Procedure: LUMBAR LAMINECTOMY/DECOMPRESSION MICRODISCECTOMY 2 LEVELS;  Surgeon: Coletta Memos, MD;  Location: [facility];  Service: Neurosurgery;  Laterality: Bilateral;  Bilateral L34 L45 laminectomies   NM MYOCAR PERF WALL MOTION  07/2013   bruce myoview - intermediate risk test with abnormal inferior horizontal ST depression; mild, fixed inferobasal defect, but no ischemia   TRANSTHORACIC ECHOCARDIOGRAM  07/2013   EF 55-60%, mild LVH; mild MR; LA mildly dilated; RVSP increased; PA peak pressure (mod pulm HTN)    Allergies  No Known Allergies   Labs/Other Studies Reviewed    The following studies were reviewed today:  Cardiac Studies & Procedures   CARDIAC CATHETERIZATION  CARDIAC CATHETERIZATION 11/08/2022  Narrative   Ost Cx lesion is 80% stenosed.   Scoring balloon angioplasty was performed using a BALLN SCOREFLEX 2.75X10.   A drug-eluting stent was successfully placed using a SYNERGY XD 2.75X16.  Deployed to 2.9 mm and postoperative 3.1 mm at the ostium and proximal lesion.   Post intervention, there is a 5% residual stenosis.   ------------------------------------------------   Minimal diffuse disease in the LAD and very small caliber Ramus   Ost RCA to Dist RCA lesion is 100% stenosed. ->  Extensive stents completely stenosed.  Flush occlusion.   Right Radial Artery graft (Rad-rPDA) was visualized by angiography and is normal in caliber. The graft exhibits no disease. There is no competitive flow   ------------------------------------------------   The left ventricular systolic function is normal.  The left ventricular ejection fraction is 55-65% by visual estimate.   LV end diastolic pressure is normal.  There is no aortic valve stenosis.  POSTPROCEDURE DIAGNOSES CULPRIT LESION: Ostial LCx 80% Successful ScoreFlex Angioplasty with DES PCI using Synergy XD 2.75 mm x 16 mm - deployed to 3.1 mm Lesion reduced to roughly 5% at the ostium but otherwise no  residual stenosis, with TIMI-3 flow pre and post. 100% CTO of Native RCA with multiple overlapping stents from ostial to distal Widely patent R Radial-rPDA widely patent with antegrade filling of the downstream PDA along with retrograde filling of the RPAV and 2 PL branches Normal LV EF by LV gram, with normal LVEDP.   RECOMMENDATIONS Continue home medications for CAD management. DAPT for minimum of 6 months, then would continue Plavix long-term based on ostial stent Same-day discharge and follow-up with Dr. Donalee Citrin, MD  Findings Coronary Findings Diagnostic  Dominance: Right  Left Main Vessel was injected. Vessel is normal in caliber.  Left Anterior Descending The vessel exhibits minimal luminal irregularities.  First Diagonal Branch Vessel is small in size.  Second Diagonal Branch Vessel is small in size.  Third Diagonal Branch Vessel is small in size.  Ramus Intermedius Vessel is small.  Left Circumflex Vessel is moderate in size. There is mild diffuse disease throughout the vessel. Ost Cx lesion is 80% stenosed. Vessel is the culprit lesion. The lesion is located at the bifurcation and eccentric.  Fibrotic  Second Obtuse Marginal Branch Vessel is small in size.  Left Posterior Atrioventricular Artery Vessel is small in size.  Right Coronary Artery Vessel was injected. Vessel is normal in caliber. There is severe diffuse disease throughout the vessel. Ost RCA to Dist RCA lesion is 100% stenosed. The lesion was previously treated using a bare metal stent and a drug eluting stent over 2 years ago.  Right Posterior Descending Artery Vessel is small in size.  Right Posterior Atrioventricular Artery Vessel is small in size.  First Right Posterolateral Branch Vessel is small in size.  Second Right Posterolateral Branch Vessel is small in size.  Right Radial Artery Graft To RPDA Right radial artery graft was visualized by angiography and is  normal in caliber.  The graft exhibits no disease. There is no competitive flow  Intervention  Ost Cx lesion Angioplasty Lesion length:  14 mm. CATH VISTA GUIDE 6FR XB3.5 guide catheter was inserted. WIRE ASAHI PROWATER 180CM guidewire used to cross lesion. Scoring balloon angioplasty was performed using a BALLN SCOREFLEX 2.75X10. Maximum pressure: 14 atm. Inflation time: 20 sec. Stent Lesion length:  16 mm. Lesion crossed with guidewire. Pre-stent angioplasty was performed using a BALLN EMERGE MR 2.5X12. Maximum pressure:  14 atm. Inflation time:  20 sec. Prior to ScoreFlex A drug-eluting stent was successfully placed using a SYNERGY XD 2.75X16. Maximum pressure: 16 atm. Inflation time: 30 sec. Stent strut is well apposed. Post-stent angioplasty was performed using a BALL SAPPHIRE NC24 3.0X12. Maximum pressure:  18 atm. Post-Intervention Lesion Assessment The intervention was successful. Pre-interventional TIMI flow is 3. Post-intervention TIMI flow is 3. Treated lesion length:  16 mm. No complications occurred at this lesion. There is a 5% residual stenosis post intervention.   STRESS TESTS  MYOCARDIAL PERFUSION IMAGING 08/30/2022  Narrative   Findings are equivocal, suggestive of possible diaphragmatic attenuation, but also concerning for ischemia. The study is intermediate risk.   No ST deviation was noted.   LV perfusion is abnormal. Defect 1: There is a medium defect with moderate reduction in uptake present in the apical to basal inferior location(s) that is partially reversible. There is normal wall motion in the defect area. Consistent with artifact caused by subdiaphragmatic activity.   Left ventricular function is normal. Nuclear stress EF: 71 %. The left ventricular ejection fraction is hyperdynamic (>65%). End diastolic cavity size is normal. End systolic cavity size is normal.   Prior study available for comparison from 08/19/2013. No changes compared to prior study. No ischemia,  bowel attenuation artifact, normal LVEF at 66%  Large, reversible basal to apical inferior perfusion defect, which partially improves with stress upright imaging, suggestive of a component of diaphragmatic attenuation artifact. Cannot exclude reversible ischemia. LVEF 71% with normal wall motion. This is an intermediate risk study. Consider further ischemic evaluation if warranted.             Recent Labs: 08/22/2022: ALT 21 11/07/2022: BUN 14; Creatinine, Ser 1.10; Hemoglobin 13.1; Potassium 5.2; Sodium 135 11/08/2022: Platelets 206  Recent Lipid Panel    Component Value Date/Time   CHOL 143 11/27/2022 0942   TRIG 119 11/27/2022 0942   HDL 40 11/27/2022 0942   CHOLHDL 3.6 11/27/2022 0942   CHOLHDL 4.9 11/21/2016 0925   VLDL 61 (H) 11/21/2016 0925   LDLCALC 81 11/27/2022 0942    History of Present Illness    72 year old male with the above past medical history including CAD, s/p numerous prior stents to the LAD and RCA, s/p  CABG x 1 in 2009, DES-oLCx in 2023, PAD s/p iliac and femoral artery stenting in 2003, hypertension, hyperlipidemia, GERD and tobacco use.   Previously followed with Dr. Alanda Amass.  He has an extensive history of CAD as above, he underwent brachytherapy to the LAD.  Eventually he developed occlusion of the right coronary artery and ultimately underwent a right free radial graft to the PDA by Dr. Laneta Simmers in 2009.  He had an abnormal stress test in September 2023.  Follow-up cardiac catheterization in 10/2022 revealed 80% ostial circumflex occlusion s/p DES, patent R PDA graft. Additionally, he has a history of PAD s/p iliac and femoral artery stenting in 2003, most recent ABIs in 07/2019 were stable.  He was last seen in the office on 03/16/2023 and was stable overall from a cardiac standpoint though he did note a 1-month history of intermittent chest discomfort both at rest and with activity.  He reported not taking his medications as prescribed.  Ranexa was discontinued as  suggested at prior visit.  Imdur was increased to 60 mg daily.  Symptoms were discussed with Dr. Rennis Golden who noted that should he have continuing symptoms cardiac PET stress test could be considered in the future.  He presents today for follow-up.  Since his last visit he has been stable overall from a cardiac standpoint.  He notes that his chest discomfort has improved overall with increased Imdur.  He has still been taking nitroglycerin 1-2 times a week.  He remains active and plays golf regularly.  He did note a recent dizzy spell while playing golf.  He denies any palpitations, presyncope, syncope.  He thinks he may have been dehydrated.  Otherwise, he denies any additional concerns.  Home Medications    Current Outpatient Medications  Medication Sig Dispense Refill   aspirin EC 81 MG tablet Take 1 tablet (81 mg total) by mouth daily. Swallow whole. 30 tablet 5   clopidogrel (PLAVIX) 75 MG tablet Take 1 tablet (75 mg total) by mouth daily. 30 tablet 11   fenofibrate (TRICOR) 145 MG tablet Take 1 tablet (145 mg total) by mouth daily. 90 tablet 3   isosorbide mononitrate (IMDUR) 60 MG 24 hr tablet Take 1.5 tablets (90 mg total) by mouth daily. 135 tablet 3   metoprolol succinate (TOPROL-XL) 50 MG 24 hr tablet TAKE 1 TABLET BY MOUTH EVERY DAY WITH OR IMMEDIATELY FOLLOWING A MEAL. 90 tablet 3   OVER THE COUNTER MEDICATION Take 6 tablets by mouth daily. Balance of nature (3) Fruit and vegetable (3)     pantoprazole (PROTONIX) 40 MG tablet Take 1 tablet (40 mg total) by mouth daily. 30 tablet 1   rosuvastatin (CRESTOR) 10 MG tablet Take 1 tablet (10 mg total) by mouth daily. 90 tablet 3   tamsulosin (FLOMAX) 0.4 MG CAPS capsule Take 0.4 mg by mouth daily.     telmisartan (MICARDIS) 80 MG tablet Take 1 tablet (80 mg total) by mouth daily. 90 tablet 1   ezetimibe (ZETIA) 10 MG tablet Take 1 tablet (10 mg total) by mouth daily. Take 1 tablet by mouth daily. 90 tablet 3   nitroGLYCERIN (NITROSTAT) 0.4 MG  SL tablet Place 1 tablet (0.4 mg total) under the tongue every 5 (five) minutes as needed for chest pain. 25 tablet 5   No current facility-administered medications for this visit.     Review of Systems    He denies palpitations, dyspnea, pnd, orthopnea, n, v, syncope, edema, weight gain, or early satiety. All other  systems reviewed and are otherwise negative except as noted above.   Physical Exam    VS:  BP 132/68   Pulse 60   Ht 5\' 7"  (1.702 m)   Wt 176 lb (79.8 kg)   BMI 27.57 kg/m  GEN: Well nourished, well developed, in no acute distress. HEENT: normal. Neck: Supple, no JVD, carotid bruits, or masses. Cardiac: RRR, no murmurs, rubs, or gallops. No clubbing, cyanosis, edema.  Radials/DP/PT 2+ and equal bilaterally.  Respiratory:  Respirations regular and unlabored, clear to auscultation bilaterally. GI: Soft, nontender, nondistended, BS + x 4. MS: no deformity or atrophy. Skin: warm and dry, no rash. Neuro:  Strength and sensation are intact. Psych: Normal affect.  Accessory Clinical Findings    ECG personally reviewed by me today -NSR, 60 bpm- no acute changes.   Lab Results  Component Value Date   WBC 6.7 11/07/2022   HGB 13.1 11/07/2022   HCT 38.4 11/07/2022   MCV 90 11/07/2022   PLT 206 11/08/2022   Lab Results  Component Value Date   CREATININE 1.10 11/07/2022   BUN 14 11/07/2022   NA 135 11/07/2022   K 5.2 11/07/2022   CL 101 11/07/2022   CO2 24 11/07/2022   Lab Results  Component Value Date   ALT 21 08/22/2022   AST 18 08/22/2022   ALKPHOS 77 08/22/2022   BILITOT 0.5 08/22/2022   Lab Results  Component Value Date   CHOL 143 11/27/2022   HDL 40 11/27/2022   LDLCALC 81 11/27/2022   TRIG 119 11/27/2022   CHOLHDL 3.6 11/27/2022    Lab Results  Component Value Date   HGBA1C  06/11/2008    5.4 (NOTE)   The ADA recommends the following therapeutic goals for glycemic   control related to Hgb A1C measurement:   Goal of Therapy:   < 7.0% Hgb A1C    Action Suggested:  > 8.0% Hgb A1C   Ref:  Diabetes Care, 22, Suppl. 1, 1999    Assessment & Plan    1. CAD: S/p numerous prior stents to the LAD and RCA, s/p CABG x 1 in 2009, DES-oLCx in 2023. Since the time of his stent procedure he has had intermittent chest discomfort both at rest and with activity. Cath in November 2023 was overall reassuring. Symptoms improved with increased Imdur.  He continues to take nitroglycerin 1-2 times a week for intermittent chest discomfort.  Will increase Imdur to 90 mg daily.  He did have a recent dizzy spell, denies any palpitations, presyncope, syncope.  He thinks he was likely dehydrated.  Encouraged adequate hydration, ongoing monitoring of BP.  We discussed the possibility of repeat ischemic evaluation (i.e. cardiac PET stress test), however, patient declines any additional testing at this time and would prefer medical therapy.  I think this is reasonable.  Reviewed ED precautions.  Continue aspirin, Plavix, metoprolol, telmisartan, Imdur as above, Crestor, and Zetia.   2. PAD: S/p iliac and femoral artery stenting in 2003. Most recent ABIs in 07/2019 were stable.  Denies any significant claudication.  Continue aspirin, Plavix, Crestor, Zetia.   3. Hypertension: BP well controlled. Continue current antihypertensive regimen.    4. Hyperlipidemia: LDL was 81 in 11/2022.  Crestor was increased from 5 mg to 10 mg daily at the time.  He is overdue for repeat fasting lipids, he states he will have these drawn.  Continue Crestor, Zetia.  5. Disposition: Follow-up in 6 months with Dr. Rennis Golden, sooner if needed.  Joylene Grapes, NP 06/08/2023, 9:45 AM

## 2023-06-11 DIAGNOSIS — E785 Hyperlipidemia, unspecified: Secondary | ICD-10-CM | POA: Diagnosis not present

## 2023-06-11 DIAGNOSIS — I25118 Atherosclerotic heart disease of native coronary artery with other forms of angina pectoris: Secondary | ICD-10-CM | POA: Diagnosis not present

## 2023-06-12 LAB — HEPATIC FUNCTION PANEL
ALT: 22 IU/L (ref 0–44)
AST: 24 IU/L (ref 0–40)
Albumin: 4.3 g/dL (ref 3.8–4.8)
Alkaline Phosphatase: 59 IU/L (ref 44–121)
Bilirubin Total: 0.5 mg/dL (ref 0.0–1.2)
Bilirubin, Direct: 0.19 mg/dL (ref 0.00–0.40)
Total Protein: 6.7 g/dL (ref 6.0–8.5)

## 2023-06-12 LAB — LIPID PANEL
Chol/HDL Ratio: 2.6 ratio (ref 0.0–5.0)
Cholesterol, Total: 98 mg/dL — ABNORMAL LOW (ref 100–199)
HDL: 38 mg/dL — ABNORMAL LOW (ref >39)
LDL Chol Calc (NIH): 37 mg/dL (ref 0–99)
Triglycerides: 129 mg/dL (ref 0–149)
VLDL Cholesterol Cal: 23 mg/dL (ref 5–40)

## 2023-06-13 ENCOUNTER — Telehealth: Payer: Self-pay

## 2023-06-13 NOTE — Telephone Encounter (Signed)
Spoke with pt. Pt was notified of lab results. Pt will continue current medication and f/u as planned.  

## 2023-08-08 DIAGNOSIS — H43811 Vitreous degeneration, right eye: Secondary | ICD-10-CM | POA: Diagnosis not present

## 2023-08-08 DIAGNOSIS — H02831 Dermatochalasis of right upper eyelid: Secondary | ICD-10-CM | POA: Diagnosis not present

## 2023-08-08 DIAGNOSIS — H02834 Dermatochalasis of left upper eyelid: Secondary | ICD-10-CM | POA: Diagnosis not present

## 2023-08-08 DIAGNOSIS — H40053 Ocular hypertension, bilateral: Secondary | ICD-10-CM | POA: Diagnosis not present

## 2023-08-08 DIAGNOSIS — H25813 Combined forms of age-related cataract, bilateral: Secondary | ICD-10-CM | POA: Diagnosis not present

## 2023-08-13 ENCOUNTER — Telehealth: Payer: Self-pay | Admitting: Internal Medicine

## 2023-08-13 NOTE — Telephone Encounter (Signed)
  Pt c/o of Chest Pain: STAT if active CP, including tightness, pressure, jaw pain, radiating pain to shoulder/upper arm/back, CP unrelieved by Nitro. Symptoms reported of SOB, nausea, vomiting, sweating.  1. Are you having CP right now?   No  2. Are you experiencing any other symptoms (ex. SOB, nausea, vomiting, sweating)?  No   3. Is your CP continuous or coming and going?   Coming and going   4. Have you taken Nitroglycerin?  Patient stated he took 2 tablets yesterday and 1 this morning  5. How long have you been experiencing CP?  Started yesterday  6. If NO CP at time of call then end call with telling Pt to call back or call 911 if Chest pain returns prior to return call from triage team.   Patient stated he is concerned about his chest pains and wants to know next steps.

## 2023-08-13 NOTE — Telephone Encounter (Signed)
Returned a call to pt. Call rang 2 times and then went to VM. Left a message to call back ASAP.

## 2023-08-13 NOTE — Telephone Encounter (Signed)
Returned call to pt again. Call rang 2 times then went straight to VM. Will send a MyChart message as well.

## 2023-08-14 NOTE — Telephone Encounter (Signed)
Left voicemail to return call to office  Also left voicemail on alternate number

## 2023-08-15 NOTE — Telephone Encounter (Signed)
Call to patient and wife VM.  VM is full and unable to send message Call to husbands cell , goes to VM and VM is full No other numbers on file.  6th attempt to contact patient.  Will wait to see if he calls back

## 2023-08-16 ENCOUNTER — Encounter (HOSPITAL_COMMUNITY): Payer: Self-pay

## 2023-08-16 ENCOUNTER — Emergency Department (HOSPITAL_COMMUNITY): Payer: PPO

## 2023-08-16 ENCOUNTER — Observation Stay (HOSPITAL_COMMUNITY)
Admission: EM | Admit: 2023-08-16 | Discharge: 2023-08-18 | Disposition: A | Discharge status: Home / Self Care | Payer: PPO | Attending: Internal Medicine | Admitting: Internal Medicine

## 2023-08-16 ENCOUNTER — Other Ambulatory Visit: Payer: Self-pay

## 2023-08-16 DIAGNOSIS — I200 Unstable angina: Secondary | ICD-10-CM | POA: Diagnosis not present

## 2023-08-16 DIAGNOSIS — Z955 Presence of coronary angioplasty implant and graft: Secondary | ICD-10-CM | POA: Insufficient documentation

## 2023-08-16 DIAGNOSIS — R079 Chest pain, unspecified: Secondary | ICD-10-CM | POA: Diagnosis not present

## 2023-08-16 DIAGNOSIS — Z87891 Personal history of nicotine dependence: Secondary | ICD-10-CM | POA: Diagnosis not present

## 2023-08-16 DIAGNOSIS — I25110 Atherosclerotic heart disease of native coronary artery with unstable angina pectoris: Principal | ICD-10-CM | POA: Insufficient documentation

## 2023-08-16 DIAGNOSIS — Z79899 Other long term (current) drug therapy: Secondary | ICD-10-CM | POA: Insufficient documentation

## 2023-08-16 DIAGNOSIS — Z7902 Long term (current) use of antithrombotics/antiplatelets: Secondary | ICD-10-CM | POA: Diagnosis not present

## 2023-08-16 DIAGNOSIS — I10 Essential (primary) hypertension: Secondary | ICD-10-CM | POA: Insufficient documentation

## 2023-08-16 DIAGNOSIS — E782 Mixed hyperlipidemia: Secondary | ICD-10-CM

## 2023-08-16 DIAGNOSIS — I700 Atherosclerosis of aorta: Secondary | ICD-10-CM | POA: Diagnosis not present

## 2023-08-16 DIAGNOSIS — Z7982 Long term (current) use of aspirin: Secondary | ICD-10-CM | POA: Diagnosis not present

## 2023-08-16 DIAGNOSIS — Z951 Presence of aortocoronary bypass graft: Secondary | ICD-10-CM | POA: Diagnosis not present

## 2023-08-16 LAB — CBC
HCT: 37.2 % — ABNORMAL LOW (ref 39.0–52.0)
HCT: 36.7 % — ABNORMAL LOW (ref 39.0–52.0)
Hemoglobin: 12.7 g/dL — ABNORMAL LOW (ref 13.0–17.0)
Hemoglobin: 12.9 g/dL — ABNORMAL LOW (ref 13.0–17.0)
MCH: 29.3 pg (ref 26.0–34.0)
MCH: 30.6 pg (ref 26.0–34.0)
MCHC: 34.1 g/dL (ref 30.0–36.0)
MCHC: 35.1 g/dL (ref 30.0–36.0)
MCV: 85.9 fL (ref 80.0–100.0)
MCV: 87.2 fL (ref 80.0–100.0)
Platelets: 227 10*3/uL (ref 150–400)
Platelets: 202 10*3/uL (ref 150–400)
RBC: 4.33 MIL/uL (ref 4.22–5.81)
RBC: 4.21 MIL/uL — ABNORMAL LOW (ref 4.22–5.81)
RDW: 13.2 % (ref 11.5–15.5)
RDW: 13.2 % (ref 11.5–15.5)
WBC: 7 10*3/uL (ref 4.0–10.5)
WBC: 7.4 10*3/uL (ref 4.0–10.5)
nRBC: 0 % (ref 0.0–0.2)
nRBC: 0 % (ref 0.0–0.2)

## 2023-08-16 LAB — BASIC METABOLIC PANEL
Anion gap: 10 (ref 5–15)
BUN: 13 mg/dL (ref 8–23)
CO2: 23 mmol/L (ref 22–32)
Calcium: 9.5 mg/dL (ref 8.9–10.3)
Chloride: 104 mmol/L (ref 98–111)
Creatinine, Ser: 1.09 mg/dL (ref 0.61–1.24)
GFR, Estimated: >60 mL/min (ref >60)
Glucose, Bld: 148 mg/dL — ABNORMAL HIGH (ref 70–99)
Potassium: 4 mmol/L (ref 3.5–5.1)
Sodium: 137 mmol/L (ref 135–145)

## 2023-08-16 LAB — TROPONIN I (HIGH SENSITIVITY)
Troponin I (High Sensitivity): 5 ng/L (ref <18)
Troponin I (High Sensitivity): 7 ng/L (ref <18)

## 2023-08-16 LAB — CREATININE, SERUM
Creatinine, Ser: 1.13 mg/dL (ref 0.61–1.24)
GFR, Estimated: >60 mL/min (ref >60)

## 2023-08-16 MED ORDER — HEPARIN SODIUM (PORCINE) 5000 UNIT/ML IJ SOLN
5000.0000 [IU] | Freq: Three times a day (TID) | INTRAMUSCULAR | Status: DC
  Administered 2023-08-16 – 2023-08-17 (×2): 5000 [IU] via SUBCUTANEOUS
  Filled 2023-08-16 (×2): qty 1

## 2023-08-16 MED ORDER — IRBESARTAN 300 MG PO TABS
300.0000 mg | ORAL_TABLET | Freq: Once | ORAL | Status: AC
  Administered 2023-08-16: 300 mg via ORAL
  Filled 2023-08-16: qty 1

## 2023-08-16 MED ORDER — SODIUM CHLORIDE 0.9 % WEIGHT BASED INFUSION: 3.0000 mL/kg/h | INTRAVENOUS | Status: DC

## 2023-08-16 MED ORDER — ROSUVASTATIN CALCIUM 5 MG PO TABS
10.0000 mg | ORAL_TABLET | Freq: Every day | ORAL | Status: DC
  Administered 2023-08-16 – 2023-08-17 (×2): 10 mg via ORAL
  Filled 2023-08-16 (×2): qty 2

## 2023-08-16 MED ORDER — METOPROLOL SUCCINATE ER 25 MG PO TB24
50.0000 mg | ORAL_TABLET | Freq: Once | ORAL | Status: AC
  Administered 2023-08-16: 50 mg via ORAL
  Filled 2023-08-16: qty 2

## 2023-08-16 MED ORDER — ONDANSETRON HCL 4 MG/2ML IJ SOLN: 4.0000 mg | Freq: Four times a day (QID) | INTRAMUSCULAR | Status: DC | PRN

## 2023-08-16 MED ORDER — ASPIRIN 81 MG PO TBEC
81.0000 mg | DELAYED_RELEASE_TABLET | Freq: Every day | ORAL | Status: DC
  Administered 2023-08-18: 81 mg via ORAL
  Filled 2023-08-16 (×2): qty 1

## 2023-08-16 MED ORDER — NITROGLYCERIN IN D5W 200-5 MCG/ML-% IV SOLN
2.0000 ug/min | INTRAVENOUS | Status: DC
  Administered 2023-08-16 – 2023-08-17 (×2): 5 ug/min via INTRAVENOUS
  Filled 2023-08-16: qty 250

## 2023-08-16 MED ORDER — PANTOPRAZOLE SODIUM 40 MG PO TBEC
40.0000 mg | DELAYED_RELEASE_TABLET | Freq: Every day | ORAL | Status: DC
  Administered 2023-08-17 – 2023-08-18 (×2): 40 mg via ORAL
  Filled 2023-08-16 (×2): qty 1

## 2023-08-16 MED ORDER — IRBESARTAN 150 MG PO TABS
300.0000 mg | ORAL_TABLET | Freq: Every day | ORAL | Status: DC
  Administered 2023-08-17 – 2023-08-18 (×2): 300 mg via ORAL
  Filled 2023-08-16: qty 2
  Filled 2023-08-16: qty 1

## 2023-08-16 MED ORDER — ASPIRIN 81 MG PO CHEW
81.0000 mg | CHEWABLE_TABLET | ORAL | Status: AC
  Administered 2023-08-17: 81 mg via ORAL
  Filled 2023-08-16: qty 1

## 2023-08-16 MED ORDER — EZETIMIBE 10 MG PO TABS
10.0000 mg | ORAL_TABLET | Freq: Every day | ORAL | Status: DC
  Administered 2023-08-17 – 2023-08-18 (×2): 10 mg via ORAL
  Filled 2023-08-16 (×2): qty 1

## 2023-08-16 MED ORDER — TAMSULOSIN HCL 0.4 MG PO CAPS: 0.4000 mg | ORAL_CAPSULE | Freq: Every day | ORAL | Status: DC

## 2023-08-16 MED ORDER — SODIUM CHLORIDE 0.9 % WEIGHT BASED INFUSION
1.0000 mL/kg/h | INTRAVENOUS | Status: DC
  Administered 2023-08-17: 1 mL/kg/h via INTRAVENOUS

## 2023-08-16 MED ORDER — METOPROLOL SUCCINATE ER 50 MG PO TB24
50.0000 mg | ORAL_TABLET | Freq: Every day | ORAL | Status: DC
  Administered 2023-08-17 – 2023-08-18 (×2): 50 mg via ORAL
  Filled 2023-08-16 (×2): qty 1

## 2023-08-16 MED ORDER — FENOFIBRATE 160 MG PO TABS
160.0000 mg | ORAL_TABLET | Freq: Every day | ORAL | Status: DC
  Administered 2023-08-18: 160 mg via ORAL
  Filled 2023-08-16 (×3): qty 1

## 2023-08-16 MED ORDER — ACETAMINOPHEN 325 MG PO TABS: 650.0000 mg | ORAL_TABLET | ORAL | Status: DC | PRN

## 2023-08-16 MED ORDER — CLOPIDOGREL BISULFATE 75 MG PO TABS
75.0000 mg | ORAL_TABLET | Freq: Every day | ORAL | Status: DC
  Administered 2023-08-17: 75 mg via ORAL
  Filled 2023-08-16: qty 1

## 2023-08-16 NOTE — H&P (Signed)
ADMISSION HISTORY AND PHYSICAL  Patient Name: Jonathan [name] Date of Encounter: 08/16/2023 Primary Care Physician: Jonathan Found, MD Cardiologist: Jonathan Nose, MD  Chief Complaint   Chest pain  Patient Profile   72 yo male with known CAD and recent PCI to the ostial LCX in 10/2022, presents with nitrate responsive chest pain concerning for unstable angina.  HPI   This is a 72 y.o. male patient of mine with a past medical history significant for coronary disease. He has had numerous prior stents to the right coronary artery and LAD and underwent brachii therapy to the LAD. Eventually developed occlusion of the right coronary artery and ultimately underwent a right free radial graft to the PDA by Jonathan [name] in 2009. He also has a history of peripheral arterial disease and underwent right common iliac and femoral artery stenting in 2003. More recently he had progressive chest pain and underwent myoview stress testing in 08/2022, which was abnormal, showing a reversible inferior perfusion defect. He was sent to cardiac catheterization and [name] to have an 80% ostial LCx stenosis which was stented. When I saw him in follow-up he was doing well and I stopped his Ranexa with plans to stop his long-acting nitrate in a few months. He then saw Jonathan Person, NP about 3 months later and had reported continued intermittent nitroglycerin use, so his imdur was increased to 60 mg daily. He was seen again in June with ongoing symptoms and the imdur was further increased to 90 mg daily.  He now presents with chest pain that started a few hours PTA. He reported continued nitroglycerin use with only minor relief. Troponin here, however, has been negative at 5 and 7.  Symptoms, however, are suggestive of unstable angina.  PMHx   Past Medical History:  Diagnosis Date   Anemia    CAD (coronary artery disease)    Femoral artery stenosis (HCC)    Gastroesophageal reflux    H/O hiatal hernia     Hyperlipidemia    Hypertension    Myocardial infarction [facility] 11/2001   PAD (peripheral artery disease) (HCC)    bilateral iliac artery stents    PONV (postoperative nausea and vomiting)    most of the time he gets nauseated   Prostate hyperplasia with urinary obstruction    S/P CABG x 1 2009   R radial to PDA   Tobacco consumption    quit cigarettes in 2008, smokes 5-6 cigars/week    Past Surgical History:  Procedure Laterality Date   ANTERIOR CERVICAL DECOMP/DISCECTOMY FUSION N/A 10/22/2014   Procedure: CERVICAL SIX SEVEN ANTERIOR CERVICAL DECOMPRESSION/DISCECTOMY FUSION  ;  Surgeon: Jonathan Memos, MD;  [facility];  Service: Neurosurgery;  Laterality: N/A;  C67 Possible C7T1 anterior cervical decompression with fusion plating and bonegraft   BACK SURGERY     CARDIAC CATHETERIZATION  12/03/2001   2.5x72mm S-660 stent to RCA (MI)   CARDIAC CATHETERIZATION  05/06/2002   PTCA & stenting of LAD and PTCA & stenting of mid/distal RCA with 3.0x28mm Zeta stent   CARDIAC CATHETERIZATION  06/03/2002   LAD with prox 30% in-stent restenosis; ramus with 95% ostial lesion; L Cfx & 3-OMs free of disease; mild 30% RCA narrowing & 20-30% in-stent restenosis in RCA (Jonathan [name])   CARDIAC CATHETERIZATION  03/24/2003   overlapping tandem DES 3.0x23 Cypher stent to mid RCA for in-stent restenosis & new lesion beyond previous stent with 3.0x12 SciMed TAXUS (Jonathan [name])   CARDIAC  CATHETERIZATION  12/08/2003   3.0x24 TAXUS overlapping 3.0x35mm TAXUS DES in RCA and cuttin balloon atherectomy (Jonathan [name])   CARDIAC CATHETERIZATION  07/10/2007   normal L main, L Cfx & 3-OMs free of disease; LAD with Zeta stent in prox region (patent); RCA with multiple stents (3.0x12 Taxus in ostium with 3.0x20 Taxus overlapping it, and series of 3.0x24 Tacus and Cypher stent overlapping this, and in distal RC 2x5x66mm Zeta(?) stent; ostium of RCA with 80% narrowing - 3x15 cutting balloon - 80% to patient  w/NO obstruction (Jonathan [name]. [name])   CARDIAC CATHETERIZATION  06/08/2008   high-grade osital & prox in-stent RCA restenosis - Jonathan [name]   COLONOSCOPY N/A 11/22/2015   Procedure: COLONOSCOPY;  Surgeon: Jonathan Ade, MD;  [facility];  Service: Endoscopy;  Laterality: N/A;  100   CORONARY ARTERY BYPASS GRAFT  2009   free right radial to PDA for progressive RCA disease & multiple re-stenosis (Jonathan [name])   CORONARY STENT INTERVENTION N/A 11/08/2022   Procedure: CORONARY STENT INTERVENTION;  Surgeon: Jonathan Lex, MD;  Location: [facility] INVASIVE CV LAB;  Service: Cardiovascular;  Laterality: N/A;   ILIAC ARTERY STENT Bilateral 01/30/2002   9x75mm Cordis SmartStent to LCIA, 9x3.21mm Genesis stent to RCIA (Jonathan [name])   LEFT HEART CATH AND CORS/GRAFTS ANGIOGRAPHY N/A 11/08/2022   Procedure: LEFT HEART CATH AND CORS/GRAFTS ANGIOGRAPHY;  Surgeon: Jonathan Lex, MD;  Location: [facility] INVASIVE CV LAB;  Service: Cardiovascular;  Laterality: N/A;   Lower Extremity Arterial Doppler  2009   R CIA stent - less than 50% diameter reduction; R CFA stent - 0-49% diameter reduction; R SFA less than 50% diameter reduction; L CFA, profunda, SFA - equal/less than 50% diameter reduction   LUMBAR LAMINECTOMY/DECOMPRESSION MICRODISCECTOMY Bilateral 03/18/2015   Procedure: LUMBAR LAMINECTOMY/DECOMPRESSION MICRODISCECTOMY 2 LEVELS;  Surgeon: Jonathan Memos, MD;  [facility];  Service: Neurosurgery;  Laterality: Bilateral;  Bilateral L34 L45 laminectomies   NM MYOCAR PERF WALL MOTION  07/2013   bruce myoview - intermediate risk test with abnormal inferior horizontal ST depression; mild, fixed inferobasal defect, but no ischemia   TRANSTHORACIC ECHOCARDIOGRAM  07/2013   EF 55-60%, mild LVH; mild MR; LA mildly dilated; RVSP increased; PA peak pressure (mod pulm HTN)    FAMHx   Family History  Problem Relation Age of Onset   Heart disease Father    CAD Brother        CABG   CAD Brother         CABG   Colon cancer Neg Hx     SOCHx    reports that he quit smoking about a year ago. His smoking use included cigarettes. He started smoking about 31 years ago. He has a 6 pack-year smoking history. He has never used smokeless tobacco. He reports current alcohol use of about 12.0 standard drinks of alcohol per week. He reports that he does not use drugs.  Outpatient Medications   No current facility-administered medications on file prior to encounter.   Current Outpatient Medications on File Prior to Encounter  Medication Sig Dispense Refill   aspirin EC 81 MG tablet Take 1 tablet (81 mg total) by mouth daily. Swallow whole. 30 tablet 5   clopidogrel (PLAVIX) 75 MG tablet Take 1 tablet (75 mg total) by mouth daily. 30 tablet 11   ezetimibe (ZETIA) 10 MG tablet Take 1 tablet (10 mg total) by mouth daily. Take 1 tablet by mouth daily. 90  tablet 3   fenofibrate (TRICOR) 145 MG tablet Take 1 tablet (145 mg total) by mouth daily. 90 tablet 3   isosorbide mononitrate (IMDUR) 60 MG 24 hr tablet Take 1.5 tablets (90 mg total) by mouth daily. 135 tablet 3   metoprolol succinate (TOPROL-XL) 50 MG 24 hr tablet TAKE 1 TABLET BY MOUTH EVERY DAY WITH OR IMMEDIATELY FOLLOWING A MEAL. 90 tablet 3   nitroGLYCERIN (NITROSTAT) 0.4 MG SL tablet Place 1 tablet (0.4 mg total) under the tongue every 5 (five) minutes as needed for chest pain. 25 tablet 5   OVER THE COUNTER MEDICATION Take 6 tablets by mouth daily. Balance of nature (3) Fruit and vegetable (3)     pantoprazole (PROTONIX) 40 MG tablet Take 1 tablet (40 mg total) by mouth daily. 30 tablet 1   rosuvastatin (CRESTOR) 10 MG tablet Take 1 tablet (10 mg total) by mouth daily. 90 tablet 3   tamsulosin (FLOMAX) 0.4 MG CAPS capsule Take 0.4 mg by mouth daily.     telmisartan (MICARDIS) 80 MG tablet Take 1 tablet (80 mg total) by mouth daily. 90 tablet 1    Inpatient Medications    Scheduled Meds:   Continuous Infusions:   PRN Meds:     ALLERGIES   No Known Allergies  ROS   Pertinent items noted in HPI and remainder of comprehensive ROS otherwise negative.  Vitals   Vitals:   08/16/23 1354 08/16/23 1549 08/16/23 1631 08/16/23 1746  BP: (!) 202/78 (!) 159/64 (!) 159/112   Pulse: 84 64 68   Resp: 16 18 18    Temp: 98.4 F (36.9 C)   98.3 F (36.8 C)  TempSrc: Oral   Oral  SpO2: 97% 100% 98%   Weight:   80.7 kg   Height:   5\' 7"  (1.702 m)    No intake or output data in the 24 hours ending 08/16/23 1829 Filed Weights   08/16/23 1631  Weight: 80.7 kg    Physical Exam   General appearance: alert and no distress Neck: no carotid bruit, no JVD, and thyroid not enlarged, symmetric, no tenderness/mass/nodules Lungs: clear to auscultation bilaterally Heart: regular rate and rhythm, S1, S2 normal, no murmur, click, rub or gallop Abdomen: soft, non-tender; bowel sounds normal; no masses,  no organomegaly Extremities: extremities normal, atraumatic, no cyanosis or edema Pulses: 2+ and symmetric Skin: Skin color, texture, turgor normal. No rashes or lesions Neurologic: Grossly normal Psych: Pleasant  Labs   Results for orders placed or performed during the hospital encounter of 08/16/23 (from the past 48 hour(s))  Basic metabolic panel     Status: Abnormal   Collection Time: 08/16/23  2:02 PM  Result Value Ref Range   Sodium 137 135 - 145 mmol/L   Potassium 4.0 3.5 - 5.1 mmol/L   Chloride 104 98 - 111 mmol/L   CO2 23 22 - 32 mmol/L   Glucose, Bld 148 (H) 70 - 99 mg/dL    Comment: Glucose reference range applies only to samples taken after fasting for at least 8 hours.   BUN 13 8 - 23 mg/dL   Creatinine, Ser 9.56 0.61 - 1.24 mg/dL   Calcium 9.5 8.9 - 21.3 mg/dL   GFR, Estimated >08 >65 mL/min    Comment: (NOTE) Calculated using the CKD-EPI Creatinine Equation (2021)    Anion gap 10 5 - 15    Comment: Performed at [facility], [address] [place], [place] [address]  CBC  Status:  Abnormal   Collection Time: 08/16/23  2:02 PM  Result Value Ref Range   WBC 7.0 4.0 - 10.5 K/uL   RBC 4.33 4.22 - 5.81 MIL/uL   Hemoglobin 12.7 (L) 13.0 - 17.0 g/dL   HCT 14.7 (L) 82.9 - 56.2 %   MCV 85.9 80.0 - 100.0 fL   MCH 29.3 26.0 - 34.0 pg   MCHC 34.1 30.0 - 36.0 g/dL   RDW 13.0 86.5 - 78.4 %   Platelets 227 150 - 400 K/uL   nRBC 0.0 0.0 - 0.2 %    Comment: Performed at [facility], [address], [place], [place] [address]  Troponin I (High Sensitivity)     Status: None   Collection Time: 08/16/23  2:02 PM  Result Value Ref Range   Troponin I (High Sensitivity) 5 <18 ng/L    Comment: (NOTE) Elevated high sensitivity troponin I (hsTnI) values and significant  changes across serial measurements may suggest ACS but many other  chronic and acute conditions are known to elevate hsTnI results.  Refer to the "Links" section for chest pain algorithms and additional  guidance. Performed at [facility], [address], [place], [place] [address]   Troponin I (High Sensitivity)     Status: None   Collection Time: 08/16/23  4:33 PM  Result Value Ref Range   Troponin I (High Sensitivity) 7 <18 ng/L    Comment: (NOTE) Elevated high sensitivity troponin I (hsTnI) values and significant  changes across serial measurements may suggest ACS but many other  chronic and acute conditions are known to elevate hsTnI results.  Refer to the "Links" section for chest pain algorithms and additional  guidance. Performed at [facility], [address] [place], [place] [address]     ECG   NSR with non-specific IVCD - Personally Reviewed  Telemetry   Sinus rhythm - Personally Reviewed  Radiology   DG Chest 2 View  Result Date: 08/16/2023 CLINICAL DATA:  Chest pain. EXAM: CHEST - 2 VIEW COMPARISON:  10/05/2014. FINDINGS: Bilateral lung fields are clear. Bilateral costophrenic angles are clear. Normal cardio-mediastinal silhouette. Status post CABG.  Aortic arch  calcifications noted. No acute osseous abnormalities. Lower cervical spinal fixation hardware seen. The soft tissues are within normal limits. IMPRESSION: No acute cardiopulmonary process. Aortic Atherosclerosis (ICD10-I70.0). Electronically Signed   By: Jules Schick M.D.   On: 08/16/2023 14:48    Cardiac Studies   N/A  Assessment   Principal Problem:   Unstable angina pectoris (HCC) Active Problems:   S/P CABG x 1   Essential hypertension   Mixed hyperlipidemia   Plan   Mr. Filkins has had progressive chest pain over the past 4 to 6 months, but continues to be refractory to increases in his Imdur. He has had some relief with short-acting nitroglycerin, but his symptoms have returned. EKG shows no ischemic changes and trop is negative x 2.  Given his significant coronary history, I feel we need to perform a definitive study to determine if there is obstructive CAD or perhaps a non-cardiac etiology to his chest pain. He is agreeable to this approach. Will keep NPO p MN and plan for cath tomorrow.  Informed Consent   Shared Decision Making/Informed Consent The risks [stroke (1 in 1000), death (1 in 1000), kidney failure [usually temporary] (1 in 500), bleeding (1 in 200), allergic reaction [possibly serious] (1 in 200)], benefits (diagnostic support and management of coronary artery disease)  and alternatives of a cardiac catheterization were discussed in detail with Mr. Lu and he is willing to proceed.    FULL CODE  Time Spent Directly with Patient:  I have spent a total of 45 minutes with patient reviewing hospital notes, telemetry, EKGs, labs and examining the patient as well as establishing an assessment and plan that was discussed with the patient.  > 50% of time was spent in direct patient care.   Length of Stay:  LOS: 0 days   Jonathan Nose, MD, [facility], FACP  [place]  [facility]  Medical Director of the [facility] Lipid Disorders &  Cardiovascular Risk Reduction  Clinic Diplomate of the American Board of Clinical Lipidology Attending Cardiologist  Direct Dial: [phone]  Fax: [phone]  Website:  [email or web address] 08/16/2023, 6:29 PM

## 2023-08-16 NOTE — ED Notes (Signed)
ED TO INPATIENT HANDOFF REPORT  ED Nurse Name and Phone #: Dot Lanes, medic [phone]  S Name/Age/Gender Jonathan [name] 72 y.o. male Room/Bed: 035C/035C  Code Status   Code Status: Full Code  Home/SNF/Other Home Patient oriented to: self, place, time, and situation Is this baseline? Yes   Triage Complete: Triage complete  Chief Complaint Unstable angina (HCC) [I20.0]  Triage Note Pt is coming for chest pain  that started 5 hrs ago, has a Hx of  MI, The pain is located in the center of his chest an the mentions he has taking up to 5 nitroglycerin today at varying times. The nitroglycerin is providing him pain relief but only for 30 minutes at a time.    Allergies No Known Allergies  Level of Care/Admitting Diagnosis ED Disposition     ED Disposition  Admit   Condition  --   Comment  Hospital Area: [facility] [[id number]]  Level of Care: Telemetry Cardiac [103]  May place patient in observation at [facility] or [facility] if equivalent level of care is available:: No  Covid Evaluation: Asymptomatic - no recent exposure (last 10 days) testing not required  Diagnosis: Unstable angina [facility]) [[id number]]  Admitting Physician: [name] [id number]  Attending Physician: Kela Millin          B Medical/Surgery History Past Medical History:  Diagnosis Date   Anemia    CAD (coronary artery disease)    Femoral artery stenosis (HCC)    Gastroesophageal reflux    H/O hiatal hernia    Hyperlipidemia    Hypertension    Myocardial infarction (HCC) 11/2001   PAD (peripheral artery disease) (HCC)    bilateral iliac artery stents    PONV (postoperative nausea and vomiting)    most of the time he gets nauseated   Prostate hyperplasia with urinary obstruction    S/P CABG x 1 2009   R radial to PDA   Tobacco consumption    quit cigarettes in 2008, smokes 5-6 cigars/week   Past Surgical History:  Procedure Laterality Date   ANTERIOR CERVICAL  DECOMP/DISCECTOMY FUSION N/A 10/22/2014   Procedure: CERVICAL SIX SEVEN ANTERIOR CERVICAL DECOMPRESSION/DISCECTOMY FUSION  ;  Surgeon: Coletta Memos, MD;  [facility];  Service: Neurosurgery;  Laterality: N/A;  C67 Possible C7T1 anterior cervical decompression with fusion plating and bonegraft   BACK SURGERY     CARDIAC CATHETERIZATION  12/03/2001   2.5x77mm S-660 stent to RCA (MI)   CARDIAC CATHETERIZATION  05/06/2002   PTCA & stenting of LAD and PTCA & stenting of mid/distal RCA with 3.0x16mm Zeta stent   CARDIAC CATHETERIZATION  06/03/2002   LAD with prox 30% in-stent restenosis; ramus with 95% ostial lesion; L Cfx & 3-OMs free of disease; mild 30% RCA narrowing & 20-30% in-stent restenosis in RCA (Dr. Laurell Josephs)   CARDIAC CATHETERIZATION  03/24/2003   overlapping tandem DES 3.0x23 Cypher stent to mid RCA for in-stent restenosis & new lesion beyond previous stent with 3.0x12 SciMed TAXUS (Dr. Jonette Eva)   CARDIAC CATHETERIZATION  12/08/2003   3.0x24 TAXUS overlapping 3.0x4mm TAXUS DES in RCA and cuttin balloon atherectomy (Dr. Jonette Eva)   CARDIAC CATHETERIZATION  07/10/2007   normal L main, L Cfx & 3-OMs free of disease; LAD with Zeta stent in prox region (patent); RCA with multiple stents (3.0x12 Taxus in ostium with 3.0x20 Taxus overlapping it, and series of 3.0x24 Tacus and Cypher stent overlapping this, and in distal RC  2x5x59mm Zeta(?) stent; ostium of RCA with 80% narrowing - 3x15 cutting balloon - 80% to patient w/NO obstruction (Dr. Mervyn Skeeters. Little)   CARDIAC CATHETERIZATION  06/08/2008   high-grade osital & prox in-stent RCA restenosis - Dr. Jonette Eva   COLONOSCOPY N/A 11/22/2015   Procedure: COLONOSCOPY;  Surgeon: Corbin Ade, MD;  [facility];  Service: Endoscopy;  Laterality: N/A;  100   CORONARY ARTERY BYPASS GRAFT  2009   free right radial to PDA for progressive RCA disease & multiple re-stenosis (Dr. Wayland Salinas)   CORONARY STENT INTERVENTION N/A  11/08/2022   Procedure: CORONARY STENT INTERVENTION;  Surgeon: Marykay Lex, MD;  Location: [facility] INVASIVE CV LAB;  Service: Cardiovascular;  Laterality: N/A;   ILIAC ARTERY STENT Bilateral 01/30/2002   9x37mm Cordis SmartStent to LCIA, 9x3.37mm Genesis stent to RCIA (Dr. Erlene Quan)   LEFT HEART CATH AND CORS/GRAFTS ANGIOGRAPHY N/A 11/08/2022   Procedure: LEFT HEART CATH AND CORS/GRAFTS ANGIOGRAPHY;  Surgeon: Marykay Lex, MD;  Location: [facility] INVASIVE CV LAB;  Service: Cardiovascular;  Laterality: N/A;   Lower Extremity Arterial Doppler  2009   R CIA stent - less than 50% diameter reduction; R CFA stent - 0-49% diameter reduction; R SFA less than 50% diameter reduction; L CFA, profunda, SFA - equal/less than 50% diameter reduction   LUMBAR LAMINECTOMY/DECOMPRESSION MICRODISCECTOMY Bilateral 03/18/2015   Procedure: LUMBAR LAMINECTOMY/DECOMPRESSION MICRODISCECTOMY 2 LEVELS;  Surgeon: Coletta Memos, MD;  [facility];  Service: Neurosurgery;  Laterality: Bilateral;  Bilateral L34 L45 laminectomies   NM MYOCAR PERF WALL MOTION  07/2013   bruce myoview - intermediate risk test with abnormal inferior horizontal ST depression; mild, fixed inferobasal defect, but no ischemia   TRANSTHORACIC ECHOCARDIOGRAM  07/2013   EF 55-60%, mild LVH; mild MR; LA mildly dilated; RVSP increased; PA peak pressure (mod pulm HTN)     A IV Location/Drains/Wounds Patient Lines/Drains/Airways Status     Active Line/Drains/Airways     Name Placement date Placement time Site Days   Incision (Closed) 03/18/15 Back Other (Comment) 03/18/15  1559  -- 3073   Wound / Incision (Open or Dehisced) 02/16/15 Puncture Lumbar Mid Bandaid DI 02/16/15  1036  Lumbar  3103            Intake/Output Last 24 hours No intake or output data in the 24 hours ending 08/16/23 1858  Labs/Imaging Results for orders placed or performed during the hospital encounter of 08/16/23 (from the past 48 hour(s))  Basic metabolic panel      Status: Abnormal   Collection Time: 08/16/23  2:02 PM  Result Value Ref Range   Sodium 137 135 - 145 mmol/L   Potassium 4.0 3.5 - 5.1 mmol/L   Chloride 104 98 - 111 mmol/L   CO2 23 22 - 32 mmol/L   Glucose, Bld 148 (H) 70 - 99 mg/dL    Comment: Glucose reference range applies only to samples taken after fasting for at least 8 hours.   BUN 13 8 - 23 mg/dL   Creatinine, Ser 1.61 0.61 - 1.24 mg/dL   Calcium 9.5 8.9 - 09.6 mg/dL   GFR, Estimated >04 >54 mL/min    Comment: (NOTE) Calculated using the CKD-EPI Creatinine Equation (2021)    Anion gap 10 5 - 15    Comment: Performed at [facility], [address] [place], [place] [address]  CBC     Status: Abnormal   Collection Time: 08/16/23  2:02 PM  Result Value Ref Range   WBC 7.0 4.0 - 10.5 K/uL   RBC 4.33 4.22 - 5.81 MIL/uL   Hemoglobin 12.7 (L) 13.0 - 17.0 g/dL   HCT 16.1 (L) 09.6 - 04.5 %   MCV 85.9 80.0 - 100.0 fL   MCH 29.3 26.0 - 34.0 pg   MCHC 34.1 30.0 - 36.0 g/dL   RDW 40.9 81.1 - 91.4 %   Platelets 227 150 - 400 K/uL   nRBC 0.0 0.0 - 0.2 %    Comment: Performed at [facility], [address] [place], [place] [address]  Troponin I (High Sensitivity)     Status: None   Collection Time: 08/16/23  2:02 PM  Result Value Ref Range   Troponin I (High Sensitivity) 5 <18 ng/L    Comment: (NOTE) Elevated high sensitivity troponin I (hsTnI) values and significant  changes across serial measurements may suggest ACS but many other  chronic and acute conditions are known to elevate hsTnI results.  Refer to the "Links" section for chest pain algorithms and additional  guidance. Performed at [facility], [address], [place], [place] [address]   Troponin I (High Sensitivity)     Status: None   Collection Time: 08/16/23  4:33 PM  Result Value Ref Range   Troponin I (High Sensitivity) 7 <18 ng/L    Comment: (NOTE) Elevated high sensitivity troponin I (hsTnI) values and significant  changes across  serial measurements may suggest ACS but many other  chronic and acute conditions are known to elevate hsTnI results.  Refer to the "Links" section for chest pain algorithms and additional  guidance. Performed at [facility], [address] [place], [place] [address]    DG Chest 2 View  Result Date: 08/16/2023 CLINICAL DATA:  Chest pain. EXAM: CHEST - 2 VIEW COMPARISON:  10/05/2014. FINDINGS: Bilateral lung fields are clear. Bilateral costophrenic angles are clear. Normal cardio-mediastinal silhouette. Status post CABG.  Aortic arch calcifications noted. No acute osseous abnormalities. Lower cervical spinal fixation hardware seen. The soft tissues are within normal limits. IMPRESSION: No acute cardiopulmonary process. Aortic Atherosclerosis (ICD10-I70.0). Electronically Signed   By: Jules Schick M.D.   On: 08/16/2023 14:48    Pending Labs Unresulted Labs (From admission, onward)     Start     Ordered   Signed and Held  CBC  (heparin)  Once,   R       Comments: Baseline for heparin therapy IF NOT ALREADY DRAWN.  Notify MD if PLT < 100 K.    Signed and Held   Signed and Held  Creatinine, serum  (heparin)  Once,   R       Comments: Baseline for heparin therapy IF NOT ALREADY DRAWN.    Signed and Held   Signed and Held  Basic metabolic panel  Tomorrow morning,   R        Signed and Held   Signed and Held  Lipid panel  Tomorrow morning,   R        Signed and Held   Signed and Held  CBC  Tomorrow morning,   R        Signed and Held            Vitals/Pain Today's Vitals   08/16/23 1631 08/16/23 1746 08/16/23 1830 08/16/23 1845  BP: (!) 159/112  (!) 159/58 (!) 137/96  Pulse: 68  60 68  Resp: 18  15 19   Temp:  98.3 F (36.8 C)    TempSrc:  Oral    SpO2: 98%  98% 99%  Weight: 178 lb (80.7 kg)     Height: 5\' 7"  (1.702 m)     PainSc: 0-No pain       Isolation Precautions No active isolations  Medications Medications  irbesartan (AVAPRO) tablet 300 mg (300 mg Oral  Given 08/16/23 1559)  metoprolol succinate (TOPROL-XL) 24 hr tablet 50 mg (50 mg Oral Given 08/16/23 1549)    Mobility walks     Focused Assessments    R Recommendations: See Admitting Provider Note  Report given to:   Additional Notes:

## 2023-08-16 NOTE — ED Triage Notes (Signed)
Pt is coming for chest pain  that started 5 hrs ago, has a Hx of  MI, The pain is located in the center of his chest an the mentions he has taking up to 5 nitroglycerin today at varying times. The nitroglycerin is providing him pain relief but only for 30 minutes at a time.

## 2023-08-16 NOTE — ED Provider Notes (Signed)
[place] EMERGENCY DEPARTMENT AT [facility] Provider Note   CSN: [id number] Arrival date & time: 08/16/23  1350     History  Chief Complaint  Patient presents with   Chest Pain    Jonathan [name] is a 72 y.o. male with past medical history significant for coronary artery disease, previous CABG, peripheral arterial disease, hypertension, hyperlipidemia, multiple stents presents with concern for chest pain which feels like pressure that started around 5 hours ago but endorses that he was having some pain last night as well.  He reports that he has taken nitroglycerin up to 5 times today, giving him some pain relief only for 30 minutes at a time.  He reports his last episode of pain began when he was bringing the garbage can in, he took a nitro around 1 hour prior to arrival and reports that he is pain-free at this time.  He reports some odd tingling sensation bilateral arms, denies any recent fall, neck injury.  He denies any significant shortness of breath, nausea, vomiting at this time.  Patient with last stent documented as November 2023 although he thinks that it may have been January of this year.  Reports that he has been taking his aspirin Plavix, he has not taken any of his blood pressure medications today.   Chest Pain      Home Medications Prior to Admission medications   Medication Sig Start Date End Date Taking? Authorizing Provider  aspirin EC 81 MG tablet Take 1 tablet (81 mg total) by mouth daily. Swallow whole. 11/08/22   Marykay Lex, MD  clopidogrel (PLAVIX) 75 MG tablet Take 1 tablet (75 mg total) by mouth daily. 01/22/23   Hilty, Lisette Abu, MD  ezetimibe (ZETIA) 10 MG tablet Take 1 tablet (10 mg total) by mouth daily. Take 1 tablet by mouth daily. 06/08/23   Joylene Grapes, NP  fenofibrate (TRICOR) 145 MG tablet Take 1 tablet (145 mg total) by mouth daily. 08/29/22 08/30/23  Swinyer, Zachary George, NP  isosorbide mononitrate (IMDUR) 60 MG 24 hr tablet Take 1.5  tablets (90 mg total) by mouth daily. 06/08/23 09/06/23  Joylene Grapes, NP  metoprolol succinate (TOPROL-XL) 50 MG 24 hr tablet TAKE 1 TABLET BY MOUTH EVERY DAY WITH OR IMMEDIATELY FOLLOWING A MEAL. 08/07/22   Hilty, Lisette Abu, MD  nitroGLYCERIN (NITROSTAT) 0.4 MG SL tablet Place 1 tablet (0.4 mg total) under the tongue every 5 (five) minutes as needed for chest pain. 06/08/23 09/06/23  Joylene Grapes, NP  pantoprazole (PROTONIX) 40 MG tablet Take 1 tablet (40 mg total) by mouth daily. 11/08/22   Marykay Lex, MD  rosuvastatin (CRESTOR) 10 MG tablet Take 1 tablet (10 mg total) by mouth daily. 11/29/22   Hilty, Lisette Abu, MD  tamsulosin (FLOMAX) 0.4 MG CAPS capsule Take 0.4 mg by mouth daily.    [provider]  telmisartan (MICARDIS) 80 MG tablet Take 1 tablet (80 mg total) by mouth daily. 04/10/23   Joylene Grapes, NP      Allergies    Patient has no known allergies.    Review of Systems   Review of Systems  Cardiovascular:  Positive for chest pain.  All other systems reviewed and are negative.   Physical Exam Updated Vital Signs BP (!) 137/96   Pulse 68   Temp 98.3 F (36.8 C) (Oral)   Resp 19   Ht 5\' 7"  (1.702 m)   Wt 80.7 kg   SpO2  99%   BMI 27.88 kg/m  Physical Exam Vitals and nursing note reviewed.  Constitutional:      General: He is not in acute distress.    Appearance: Normal appearance.  HENT:     Head: Normocephalic and atraumatic.  Eyes:     General:        Right eye: No discharge.        Left eye: No discharge.  Cardiovascular:     Rate and Rhythm: Normal rate and regular rhythm.     Heart sounds: No murmur heard.    No friction rub. No gallop.  Pulmonary:     Effort: Pulmonary effort is normal.     Breath sounds: Normal breath sounds.  Chest:     Comments: No significant ttp of chest wall Abdominal:     General: Bowel sounds are normal.     Palpations: Abdomen is soft.  Skin:    General: Skin is warm and dry.     Capillary Refill: Capillary  refill takes less than 2 seconds.  Neurological:     Mental Status: He is alert and oriented to person, place, and time.  Psychiatric:        Mood and Affect: Mood normal.        Behavior: Behavior normal.     ED Results / Procedures / Treatments   Labs (all labs ordered are listed, but only abnormal results are displayed) Labs Reviewed  BASIC METABOLIC PANEL - Abnormal; Notable for the following components:      Result Value   Glucose, Bld 148 (*)    All other components within normal limits  CBC - Abnormal; Notable for the following components:   Hemoglobin 12.7 (*)    HCT 37.2 (*)    All other components within normal limits  TROPONIN I (HIGH SENSITIVITY)  TROPONIN I (HIGH SENSITIVITY)    EKG EKG Interpretation Date/Time:  Thursday August 16 2023 14:31:36 EDT Ventricular Rate:  69 PR Interval:  185 QRS Duration:  119 QT Interval:  404 QTC Calculation: 433 R Axis:   75  Text Interpretation: Sinus rhythm Nonspecific intraventricular conduction delay  no significant change since Nov 2023 Confirmed by Pricilla Loveless [phone]) on 08/16/2023 2:34:55 PM  Radiology DG Chest 2 View  Result Date: 08/16/2023 CLINICAL DATA:  Chest pain. EXAM: CHEST - 2 VIEW COMPARISON:  10/05/2014. FINDINGS: Bilateral lung fields are clear. Bilateral costophrenic angles are clear. Normal cardio-mediastinal silhouette. Status post CABG.  Aortic arch calcifications noted. No acute osseous abnormalities. Lower cervical spinal fixation hardware seen. The soft tissues are within normal limits. IMPRESSION: No acute cardiopulmonary process. Aortic Atherosclerosis (ICD10-I70.0). Electronically Signed   By: Jules Schick M.D.   On: 08/16/2023 14:48    Procedures Procedures    Medications Ordered in ED Medications  irbesartan (AVAPRO) tablet 300 mg (300 mg Oral Given 08/16/23 1559)  metoprolol succinate (TOPROL-XL) 24 hr tablet 50 mg (50 mg Oral Given 08/16/23 1549)    ED Course/ Medical Decision  Making/ A&P Clinical Course as of 08/16/23 1916  Thu Aug 16, 2023  1709 On re-evaluation patient continues to be chest pain free - endorsed chest pain was worse after eating  [CP]  1811 Jonathan [name] to evaluate [CP]    Clinical Course User Index [CP] Olene Floss, PA-C                                 Medical  Decision Making Amount and/or Complexity of Data Reviewed Labs: ordered. Radiology: ordered.  Risk Prescription drug management. Decision regarding hospitalization.   This patient is a 72 y.o. male who presents to the ED for concern of chest pain, this involves an extensive number of treatment options, and is a complaint that carries with it a high risk of complications and morbidity. The emergent differential diagnosis prior to evaluation includes, but is not limited to,  ACS, AAS, PE, Mallory-Weiss, Boerhaave's, Pneumonia, acute bronchitis, asthma or COPD exacerbation, anxiety, MSK pain or traumatic injury to the chest, acid reflux versus other . This is not an exhaustive differential.   Past Medical History / Co-morbidities / Social History: coronary artery disease, previous CABG, peripheral arterial disease, hypertension, hyperlipidemia, multiple stents  Additional history: Chart reviewed. Pertinent results include: Reviewed recent outpatient cardiology notes, as well as previous cardiology studies including cardiac catheterization, previous echo  Physical Exam: Physical exam performed. The pertinent findings include: Patient with some significant high blood pressure on arrival, 202/78, significantly improved 137/96 after administering patient's home blood pressure medication and resting in bed.  He has no significant tenderness palpation of the chest wall.  Vital signs otherwise stable, normal respiratory rate and stable oxygen saturation on room air.  Lab Tests: I ordered, and personally interpreted labs.  The pertinent results include: CBC overall unremarkable, very  mild anemia, hemoglobin 12.7, BMP with mildly elevated glucose at 148 for nonfasting lab values, otherwise unremarkable.  Initial troponin 5, with delta at 7.  Patient remains chest pain-free.   Imaging Studies: I ordered imaging studies including plain film chest x-ray. I independently visualized and interpreted imaging which showed no evidence of acute intrathoracic abnormality. I agree with the radiologist interpretation.   Cardiac Monitoring:  The patient was maintained on a cardiac monitor.  My attending physician Dr. Anitra Lauth viewed and interpreted the cardiac monitored which showed an underlying rhythm of: Normal sinus rhythm, no significant change from recent baseline. I agree with this interpretation.   Medications: I ordered medication including Avapro and Toprol for blood pressure. Reevaluation of the patient after these medicines showed that the patient improved.   Consultations Obtained: I requested consultation with the cardiologist, spoke with Dr. Antoine Poche,  and discussed lab and imaging findings as well as pertinent plan - they recommend: Patient will be evaluated at bedside by cardiology, after speaking with the patient at bedside Dr. Rennis Golden will admit to cardiology service, concern for unstable angina   Disposition: After consideration of the diagnostic results and the patients response to treatment, I feel that patient would benefit from admission.   I discussed this case with my attending physician Dr. Anitra Lauth who cosigned this note including patient's presenting symptoms, physical exam, and planned diagnostics and interventions. Attending physician stated agreement with plan or made changes to plan which were implemented.    Final Clinical Impression(s) / ED Diagnoses Final diagnoses:  Unstable angina [facility])    Rx / DC Orders ED Discharge Orders     None         [place] 08/16/23 1916    Gwyneth Sprout, MD 08/16/23 [phone]

## 2023-08-16 NOTE — ED Notes (Signed)
Patient transported to X-ray 

## 2023-08-17 ENCOUNTER — Encounter (HOSPITAL_COMMUNITY)
Admission: EM | Disposition: A | Discharge status: Home / Self Care | Payer: Self-pay | Source: Home / Self Care | Attending: Emergency Medicine

## 2023-08-17 DIAGNOSIS — Z7982 Long term (current) use of aspirin: Secondary | ICD-10-CM | POA: Diagnosis not present

## 2023-08-17 DIAGNOSIS — I10 Essential (primary) hypertension: Secondary | ICD-10-CM | POA: Diagnosis not present

## 2023-08-17 DIAGNOSIS — Z87891 Personal history of nicotine dependence: Secondary | ICD-10-CM | POA: Diagnosis not present

## 2023-08-17 DIAGNOSIS — Z79899 Other long term (current) drug therapy: Secondary | ICD-10-CM | POA: Diagnosis not present

## 2023-08-17 DIAGNOSIS — Z7902 Long term (current) use of antithrombotics/antiplatelets: Secondary | ICD-10-CM | POA: Diagnosis not present

## 2023-08-17 DIAGNOSIS — Z955 Presence of coronary angioplasty implant and graft: Secondary | ICD-10-CM | POA: Diagnosis not present

## 2023-08-17 DIAGNOSIS — I200 Unstable angina: Secondary | ICD-10-CM | POA: Diagnosis not present

## 2023-08-17 DIAGNOSIS — I25110 Atherosclerotic heart disease of native coronary artery with unstable angina pectoris: Secondary | ICD-10-CM

## 2023-08-17 DIAGNOSIS — Z951 Presence of aortocoronary bypass graft: Secondary | ICD-10-CM | POA: Diagnosis not present

## 2023-08-17 DIAGNOSIS — E782 Mixed hyperlipidemia: Secondary | ICD-10-CM | POA: Diagnosis not present

## 2023-08-17 HISTORY — PX: LEFT HEART CATH AND CORS/GRAFTS ANGIOGRAPHY: CATH118250

## 2023-08-17 HISTORY — PX: CORONARY BALLOON ANGIOPLASTY: CATH118233

## 2023-08-17 HISTORY — PX: CORONARY ULTRASOUND/IVUS: CATH118244

## 2023-08-17 LAB — BASIC METABOLIC PANEL
Anion gap: 7 (ref 5–15)
BUN: 13 mg/dL (ref 8–23)
CO2: 27 mmol/L (ref 22–32)
Calcium: 9.4 mg/dL (ref 8.9–10.3)
Chloride: 103 mmol/L (ref 98–111)
Creatinine, Ser: 1.09 mg/dL (ref 0.61–1.24)
GFR, Estimated: >60 mL/min (ref >60)
Glucose, Bld: 96 mg/dL (ref 70–99)
Potassium: 4 mmol/L (ref 3.5–5.1)
Sodium: 137 mmol/L (ref 135–145)

## 2023-08-17 LAB — CBC
HCT: 38.9 % — ABNORMAL LOW (ref 39.0–52.0)
Hemoglobin: 13.4 g/dL (ref 13.0–17.0)
MCH: 30 pg (ref 26.0–34.0)
MCHC: 34.4 g/dL (ref 30.0–36.0)
MCV: 87 fL (ref 80.0–100.0)
Platelets: 222 10*3/uL (ref 150–400)
RBC: 4.47 MIL/uL (ref 4.22–5.81)
RDW: 13.3 % (ref 11.5–15.5)
WBC: 8.4 10*3/uL (ref 4.0–10.5)
nRBC: 0 % (ref 0.0–0.2)

## 2023-08-17 LAB — LIPID PANEL
Cholesterol: 114 mg/dL (ref 0–200)
HDL: 35 mg/dL — ABNORMAL LOW (ref >40)
LDL Cholesterol: 43 mg/dL (ref 0–99)
Total CHOL/HDL Ratio: 3.3 ratio
Triglycerides: 178 mg/dL — ABNORMAL HIGH (ref <150)
VLDL: 36 mg/dL (ref 0–40)

## 2023-08-17 LAB — POCT ACTIVATED CLOTTING TIME
Activated Clotting Time: 238 seconds
Activated Clotting Time: 256 seconds
Activated Clotting Time: 635 seconds
Activated Clotting Time: 299 seconds

## 2023-08-17 SURGERY — LEFT HEART CATH AND CORS/GRAFTS ANGIOGRAPHY
Anesthesia: LOCAL

## 2023-08-17 MED ORDER — MORPHINE SULFATE (PF) 2 MG/ML IV SOLN: 2.0000 mg | INTRAVENOUS | Status: DC | PRN

## 2023-08-17 MED ORDER — HYDRALAZINE HCL 20 MG/ML IJ SOLN
INTRAMUSCULAR | Status: AC
  Filled 2023-08-17: qty 1

## 2023-08-17 MED ORDER — HEPARIN SODIUM (PORCINE) 1000 UNIT/ML IJ SOLN
INTRAMUSCULAR | Status: AC
  Filled 2023-08-17: qty 10

## 2023-08-17 MED ORDER — MIDAZOLAM HCL 2 MG/2ML IJ SOLN
INTRAMUSCULAR | Status: AC
  Filled 2023-08-17: qty 2

## 2023-08-17 MED ORDER — SODIUM CHLORIDE 0.9% FLUSH: 3.0000 mL | INTRAVENOUS | Status: DC | PRN

## 2023-08-17 MED ORDER — LABETALOL HCL 5 MG/ML IV SOLN: 10.0000 mg | INTRAVENOUS | Status: AC | PRN

## 2023-08-17 MED ORDER — LIDOCAINE HCL (PF) 1 % IJ SOLN
INTRAMUSCULAR | Status: AC
  Filled 2023-08-17: qty 30

## 2023-08-17 MED ORDER — HEPARIN (PORCINE) IN NACL 1000-0.9 UT/500ML-% IV SOLN
INTRAVENOUS | Status: DC | PRN
  Administered 2023-08-17 (×3): 500 mL

## 2023-08-17 MED ORDER — NITROGLYCERIN 1 MG/10 ML FOR IR/CATH LAB
INTRA_ARTERIAL | Status: AC
  Filled 2023-08-17: qty 10

## 2023-08-17 MED ORDER — MIDAZOLAM HCL 2 MG/2ML IJ SOLN
INTRAMUSCULAR | Status: DC | PRN
  Administered 2023-08-17: 2 mg via INTRAVENOUS
  Administered 2023-08-17: 1 mg via INTRAVENOUS

## 2023-08-17 MED ORDER — VERAPAMIL HCL 2.5 MG/ML IV SOLN
INTRAVENOUS | Status: DC | PRN
  Administered 2023-08-17: 10 mL via INTRA_ARTERIAL

## 2023-08-17 MED ORDER — FENTANYL CITRATE (PF) 100 MCG/2ML IJ SOLN
INTRAMUSCULAR | Status: AC
  Filled 2023-08-17: qty 2

## 2023-08-17 MED ORDER — SODIUM CHLORIDE 0.9 % IV SOLN
INTRAVENOUS | Status: AC | PRN
  Administered 2023-08-17: 50 mL/h via INTRAVENOUS

## 2023-08-17 MED ORDER — HEPARIN SODIUM (PORCINE) 1000 UNIT/ML IJ SOLN
INTRAMUSCULAR | Status: DC | PRN
  Administered 2023-08-17: 4000 [IU] via INTRAVENOUS
  Administered 2023-08-17: 3000 [IU] via INTRAVENOUS
  Administered 2023-08-17: 2000 [IU] via INTRAVENOUS
  Administered 2023-08-17: 4000 [IU] via INTRAVENOUS

## 2023-08-17 MED ORDER — NITROGLYCERIN IN D5W 200-5 MCG/ML-% IV SOLN
INTRAVENOUS | Status: AC
  Filled 2023-08-17: qty 250

## 2023-08-17 MED ORDER — LIDOCAINE HCL (PF) 1 % IJ SOLN
INTRAMUSCULAR | Status: DC | PRN
  Administered 2023-08-17: 2 mL

## 2023-08-17 MED ORDER — SODIUM CHLORIDE 0.9 % WEIGHT BASED INFUSION: 1.0000 mL/kg/h | INTRAVENOUS | Status: AC

## 2023-08-17 MED ORDER — SODIUM CHLORIDE 0.9% FLUSH
3.0000 mL | Freq: Two times a day (BID) | INTRAVENOUS | Status: DC
  Administered 2023-08-18: 3 mL via INTRAVENOUS

## 2023-08-17 MED ORDER — VERAPAMIL HCL 2.5 MG/ML IV SOLN
INTRAVENOUS | Status: AC
  Filled 2023-08-17: qty 2

## 2023-08-17 MED ORDER — NITROGLYCERIN 1 MG/10 ML FOR IR/CATH LAB
INTRA_ARTERIAL | Status: DC | PRN
  Administered 2023-08-17 (×2): 200 ug via INTRACORONARY

## 2023-08-17 MED ORDER — IOHEXOL 350 MG/ML SOLN
INTRAVENOUS | Status: DC | PRN
  Administered 2023-08-17: 130 mL

## 2023-08-17 MED ORDER — HYDRALAZINE HCL 20 MG/ML IJ SOLN
INTRAMUSCULAR | Status: DC | PRN
  Administered 2023-08-17: 10 mg via INTRAVENOUS

## 2023-08-17 MED ORDER — HEPARIN SODIUM (PORCINE) 5000 UNIT/ML IJ SOLN
5000.0000 [IU] | Freq: Three times a day (TID) | INTRAMUSCULAR | Status: DC
  Administered 2023-08-17 – 2023-08-18 (×2): 5000 [IU] via SUBCUTANEOUS
  Filled 2023-08-17 (×2): qty 1

## 2023-08-17 MED ORDER — FENTANYL CITRATE (PF) 100 MCG/2ML IJ SOLN
INTRAMUSCULAR | Status: DC | PRN
  Administered 2023-08-17: 25 ug via INTRAVENOUS
  Administered 2023-08-17: 50 ug via INTRAVENOUS
  Administered 2023-08-17: 25 ug via INTRAVENOUS
  Administered 2023-08-17: 50 ug via INTRAVENOUS

## 2023-08-17 MED ORDER — SODIUM CHLORIDE 0.9 % IV SOLN: 250.0000 mL | INTRAVENOUS | Status: DC | PRN

## 2023-08-17 MED ORDER — HYDRALAZINE HCL 20 MG/ML IJ SOLN: 10.0000 mg | INTRAMUSCULAR | Status: AC | PRN

## 2023-08-17 SURGICAL SUPPLY — 26 items
BALLN EMERGE MR 2.0X12 (BALLOONS) ×1
BALLN WOLVERINE 3.00X10 (BALLOONS) ×1
BALLN ~~LOC~~ EMERGE MR 2.5X12 (BALLOONS) ×1
BALLN ~~LOC~~ EMERGE MR 3.25X12 (BALLOONS) ×1
BALLN ~~LOC~~ EMERGE MR 3.25X8 (BALLOONS) ×1
BALLOON EMERGE MR 2.0X12 (BALLOONS) IMPLANT
BALLOON WOLVERINE 3.00X10 (BALLOONS) IMPLANT
BALLOON ~~LOC~~ EMERGE MR 2.5X12 (BALLOONS) IMPLANT
BALLOON ~~LOC~~ EMERGE MR 3.25X12 (BALLOONS) IMPLANT
BALLOON ~~LOC~~ EMERGE MR 3.25X8 (BALLOONS) IMPLANT
CATH INFINITI 5FR MULTPACK ANG (CATHETERS) IMPLANT
CATH LAUNCHER 6FR EBU3.5 (CATHETERS) IMPLANT
CATH OPTICROSS HD (CATHETERS) IMPLANT
CATH SHOCKWAVE C2 3.0X12 (CATHETERS) IMPLANT
DEVICE RAD COMP TR BAND LRG (VASCULAR PRODUCTS) IMPLANT
ELECT DEFIB PAD ADLT CADENCE (PAD) IMPLANT
GLIDESHEATH SLEND SS 6F .021 (SHEATH) IMPLANT
GUIDEWIRE INQWIRE 1.5J.035X260 (WIRE) IMPLANT
INQWIRE 1.5J .035X260CM (WIRE) ×1
KIT ENCORE 26 ADVANTAGE (KITS) IMPLANT
KIT HEART LEFT (KITS) IMPLANT
PACK CARDIAC CATHETERIZATION (CUSTOM PROCEDURE TRAY) ×1 IMPLANT
SLED PULL BACK IVUS (MISCELLANEOUS) IMPLANT
TRANSDUCER W/STOPCOCK (MISCELLANEOUS) IMPLANT
TUBING CIL FLEX 10 FLL-RA (TUBING) IMPLANT
WIRE ASAHI PROWATER 180CM (WIRE) IMPLANT

## 2023-08-17 NOTE — Progress Notes (Addendum)
Received update from nurse that patient is now chest pain free. Per report patient had gone to cath lab from 3e with findings as noted/unsuccessful PCI. He then went to 6e. Nurse states Dr. Rennis Golden had evaluated patient for recurrent CP post procedure (4-5/10) about an hour ago and recommended initiation of IV NTG + EKG. Dr. Rennis Golden had signed out to Dr. Royann Shivers who spoke to nurse and ordered PRN morphine.  EKG showed NSR without acute STT changes. Nurse relayed update that patient is now pain free on 33mcg/min of IV NTG, VSS. They are aware to notify of any recurrent symptoms. We will also plan to relay this update to on-call fellow at shift change.

## 2023-08-17 NOTE — Progress Notes (Signed)
DAILY PROGRESS NOTE   Patient Name: Jonathan [name] Date of Encounter: 08/17/2023 Cardiologist: [name], MD  Chief Complaint   Some chest pain overnight  Patient Profile   72 yo male with known CAD and recent PCI to the ostial LCX in 10/2022, presents with nitrate responsive chest pain concerning for unstable angina.   Subjective   Some chest pain overnight- started on a nitroglycerin gtts by the covering fellow, then discontinued this morning- BP remained elevated overnight. Plan for LHC today. Labs appear normal. TC 114, HDL 35, TG 178 and LDL 43.     Objective   Vitals:   08/16/23 2330 08/17/23 0145 08/17/23 0426 08/17/23 0732  BP: (!) 144/46 (!) 150/60 (!) 155/55 (!) 180/53  Pulse: 65 (!) 59 64 68  Resp: 18  20 20   Temp: 97.8 F (36.6 C)  98.1 F (36.7 C) 98.2 F (36.8 C)  TempSrc: Oral  Oral Oral  SpO2: 96% 99% 98% 97%  Weight:      Height:        Intake/Output Summary (Last 24 hours) at 08/17/2023 0848 Last data filed at 08/17/2023 2440 Gross per 24 hour  Intake 149.64 ml  Output 550 ml  Net -400.36 ml   Filed Weights   08/16/23 1631 08/16/23 2015  Weight: 80.7 kg 80.1 kg    Physical Exam   General appearance: alert and no distress Lungs: clear to auscultation bilaterally Heart: regular rate and rhythm, S1, S2 normal, no murmur, click, rub or gallop Extremities: extremities normal, atraumatic, no cyanosis or edema Neurologic: Grossly normal  Inpatient Medications    Scheduled Meds:  aspirin EC  81 mg Oral Daily   clopidogrel  75 mg Oral Daily   ezetimibe  10 mg Oral Daily   fenofibrate  160 mg Oral Daily   heparin  5,000 Units Subcutaneous Q8H   irbesartan  300 mg Oral Daily   metoprolol succinate  50 mg Oral Daily   pantoprazole  40 mg Oral Daily   rosuvastatin  10 mg Oral QHS   tamsulosin  0.4 mg Oral QPC supper    Continuous Infusions:  sodium chloride 1 mL/kg/hr (08/17/23 1027)   nitroGLYCERIN Stopped (08/17/23 0624)     PRN Meds: acetaminophen, ondansetron (ZOFRAN) IV   Labs   Results for orders placed or performed during the hospital encounter of 08/16/23 (from the past 48 hour(s))  Basic metabolic panel     Status: Abnormal   Collection Time: 08/16/23  2:02 PM  Result Value Ref Range   Sodium 137 135 - 145 mmol/L   Potassium 4.0 3.5 - 5.1 mmol/L   Chloride 104 98 - 111 mmol/L   CO2 23 22 - 32 mmol/L   Glucose, Bld 148 (H) 70 - 99 mg/dL    Comment: Glucose reference range applies only to samples taken after fasting for at least 8 hours.   BUN 13 8 - 23 mg/dL   Creatinine, Ser 2.53 0.61 - 1.24 mg/dL   Calcium 9.5 8.9 - 66.4 mg/dL   GFR, Estimated >40 >34 mL/min    Comment: (NOTE) Calculated using the CKD-EPI Creatinine Equation (2021)    Anion gap 10 5 - 15    Comment: Performed at [facility], [address] [place], [place] [address]  CBC     Status: Abnormal   Collection Time: 08/16/23  2:02 PM  Result Value Ref Range   WBC 7.0 4.0 - 10.5 K/uL   RBC  4.33 4.22 - 5.81 MIL/uL   Hemoglobin 12.7 (L) 13.0 - 17.0 g/dL   HCT 56.2 (L) 13.0 - 86.5 %   MCV 85.9 80.0 - 100.0 fL   MCH 29.3 26.0 - 34.0 pg   MCHC 34.1 30.0 - 36.0 g/dL   RDW 78.4 69.6 - 29.5 %   Platelets 227 150 - 400 K/uL   nRBC 0.0 0.0 - 0.2 %    Comment: Performed at [facility], [address], [place], [place] [address]  Troponin I (High Sensitivity)     Status: None   Collection Time: 08/16/23  2:02 PM  Result Value Ref Range   Troponin I (High Sensitivity) 5 <18 ng/L    Comment: (NOTE) Elevated high sensitivity troponin I (hsTnI) values and significant  changes across serial measurements may suggest ACS but many other  chronic and acute conditions are known to elevate hsTnI results.  Refer to the "Links" section for chest pain algorithms and additional  guidance. Performed at [facility], [address], [place], [place] [address]   Troponin I (High Sensitivity)     Status: None   Collection  Time: 08/16/23  4:33 PM  Result Value Ref Range   Troponin I (High Sensitivity) 7 <18 ng/L    Comment: (NOTE) Elevated high sensitivity troponin I (hsTnI) values and significant  changes across serial measurements may suggest ACS but many other  chronic and acute conditions are known to elevate hsTnI results.  Refer to the "Links" section for chest pain algorithms and additional  guidance. Performed at [facility], [address] [place], [place] [address]   CBC     Status: Abnormal   Collection Time: 08/16/23  8:24 PM  Result Value Ref Range   WBC 7.4 4.0 - 10.5 K/uL   RBC 4.21 (L) 4.22 - 5.81 MIL/uL   Hemoglobin 12.9 (L) 13.0 - 17.0 g/dL   HCT 36.6 (L) 44.0 - 34.7 %   MCV 87.2 80.0 - 100.0 fL   MCH 30.6 26.0 - 34.0 pg   MCHC 35.1 30.0 - 36.0 g/dL   RDW 42.5 95.6 - 38.7 %   Platelets 202 150 - 400 K/uL   nRBC 0.0 0.0 - 0.2 %    Comment: Performed at [facility], [address], [place], [place] [address]  Creatinine, serum     Status: None   Collection Time: 08/16/23  8:24 PM  Result Value Ref Range   Creatinine, Ser 1.13 0.61 - 1.24 mg/dL   GFR, Estimated >29 >51 mL/min    Comment: (NOTE) Calculated using the CKD-EPI Creatinine Equation (2021) Performed at [facility], [address] [place], [place] [address]   Basic metabolic panel     Status: None   Collection Time: 08/17/23  4:51 AM  Result Value Ref Range   Sodium 137 135 - 145 mmol/L   Potassium 4.0 3.5 - 5.1 mmol/L   Chloride 103 98 - 111 mmol/L   CO2 27 22 - 32 mmol/L   Glucose, Bld 96 70 - 99 mg/dL    Comment: Glucose reference range applies only to samples taken after fasting for at least 8 hours.   BUN 13 8 - 23 mg/dL   Creatinine, Ser 6.06 0.61 - 1.24 mg/dL   Calcium 9.4 8.9 - 30.1 mg/dL   GFR, Estimated >60 >10 mL/min    Comment: (NOTE) Calculated using the CKD-EPI Creatinine Equation (2021)    Anion gap  7 5 - 15    Comment: Performed at [facility], [address], [place], [place] [address]  Lipid panel     Status: Abnormal   Collection Time: 08/17/23  4:51 AM  Result Value Ref Range   Cholesterol 114 0 - 200 mg/dL   Triglycerides 604 (H) <150 mg/dL   HDL 35 (L) >54 mg/dL   Total CHOL/HDL Ratio 3.3 RATIO   VLDL 36 0 - 40 mg/dL   LDL Cholesterol 43 0 - 99 mg/dL    Comment:        Total Cholesterol/HDL:CHD Risk Coronary Heart Disease Risk Table                     Men   Women  1/2 Average Risk   3.4   3.3  Average Risk       5.0   4.4  2 X Average Risk   9.6   7.1  3 X Average Risk  23.4   11.0        Use the calculated Patient Ratio above and the CHD Risk Table to determine the patient's CHD Risk.        ATP III CLASSIFICATION (LDL):  <100     mg/dL   Optimal  098-119  mg/dL   Near or Above                    Optimal  130-159  mg/dL   Borderline  147-829  mg/dL   High  >562     mg/dL   Very High Performed at [facility], [address], [place], [place] [address]   CBC     Status: Abnormal   Collection Time: 08/17/23  4:51 AM  Result Value Ref Range   WBC 8.4 4.0 - 10.5 K/uL   RBC 4.47 4.22 - 5.81 MIL/uL   Hemoglobin 13.4 13.0 - 17.0 g/dL   HCT 57.8 (L) 46.9 - 62.9 %   MCV 87.0 80.0 - 100.0 fL   MCH 30.0 26.0 - 34.0 pg   MCHC 34.4 30.0 - 36.0 g/dL   RDW 52.8 41.3 - 24.4 %   Platelets 222 150 - 400 K/uL   nRBC 0.0 0.0 - 0.2 %    Comment: Performed at [facility], [address], [place], [place] [address]    ECG   N/A  Telemetry   Normal sinus rhythm - Personally Reviewed  Radiology    DG Chest 2 View  Result Date: 08/16/2023 CLINICAL DATA:  Chest pain. EXAM: CHEST - 2 VIEW COMPARISON:  10/05/2014. FINDINGS: Bilateral lung fields are clear. Bilateral costophrenic angles are clear. Normal cardio-mediastinal silhouette. Status post CABG.  Aortic arch calcifications noted. No acute osseous abnormalities. Lower cervical spinal fixation hardware seen. The soft tissues are within normal limits. IMPRESSION: No  acute cardiopulmonary process. Aortic Atherosclerosis (ICD10-I70.0). Electronically Signed   By: Jules Schick M.D.   On: 08/16/2023 14:48    Cardiac Studies   N/A  Assessment   Principal Problem:   Unstable angina pectoris (HCC) Active Problems:   S/P CABG x 1   Essential hypertension   Mixed hyperlipidemia   Unstable angina (HCC)   Plan   Chest pain overnight- hypertensive, placed on a nitroglycerin gtts, but not running this morning. Not given am BP meds yet. Labs normal. LDL <70. Plan for [facility] today with Dr. Herbie Baltimore.  Time Spent Directly with Patient:  I have  spent a total of 25 minutes with the patient reviewing hospital notes, telemetry, EKGs, labs and examining the patient as well as establishing an assessment and plan that was discussed personally with the patient.  > 50% of time was spent in direct patient care.  Length of Stay:  LOS: 0 days   [name], MD, [facility], FACP  [place]  [facility]  Medical Director of the [facility]  Cardiovascular Risk Reduction Clinic Diplomate of the American Board of Clinical Lipidology Attending Cardiologist  Direct Dial: [phone]  Fax: [phone]  Website:  [email or web address] 08/17/2023, 8:48 AM

## 2023-08-17 NOTE — Interval H&P Note (Signed)
History and Physical Interval Note:  08/17/2023 2:12 PM  Jonathan [name]  has presented today for surgery, with the diagnosis of unstable angina.  The various methods of treatment have been discussed with the patient and family. After consideration of risks, benefits and other options for treatment, the patient has consented to  Procedure(s): LEFT HEART CATH AND CORS/GRAFTS ANGIOGRAPHY (N/A) as a surgical intervention.  The patient's history has been reviewed, patient examined, no change in status, stable for surgery.  I have reviewed the patient's chart and labs.  Questions were answered to the patient's satisfaction.   Cath Lab Visit (complete for each Cath Lab visit)  Clinical Evaluation Leading to the Procedure:   ACS: Yes.    Non-ACS:    Anginal Classification: CCS III  Anti-ischemic medical therapy: Maximal Therapy (2 or more classes of medications)  Non-Invasive Test Results: No non-invasive testing performed  Prior CABG: Previous CABG        [facility] 08/17/2023 2:13 PM

## 2023-08-17 NOTE — H&P (View-Only) (Signed)
DAILY PROGRESS NOTE   Patient Name: Jonathan [name] Date of Encounter: 08/17/2023 Cardiologist: [name], MD  Chief Complaint   Some chest pain overnight  Patient Profile   72 yo male with known CAD and recent PCI to the ostial LCX in 10/2022, presents with nitrate responsive chest pain concerning for unstable angina.   Subjective   Some chest pain overnight- started on a nitroglycerin gtts by the covering fellow, then discontinued this morning- BP remained elevated overnight. Plan for [facility] today. Labs appear normal. TC 114, HDL 35, TG 178 and LDL 43.     Objective   Vitals:   08/16/23 2330 08/17/23 0145 08/17/23 0426 08/17/23 0732  BP: (!) 144/46 (!) 150/60 (!) 155/55 (!) 180/53  Pulse: 65 (!) 59 64 68  Resp: 18  20 20   Temp: 97.8 F (36.6 C)  98.1 F (36.7 C) 98.2 F (36.8 C)  TempSrc: Oral  Oral Oral  SpO2: 96% 99% 98% 97%  Weight:      Height:        Intake/Output Summary (Last 24 hours) at 08/17/2023 0848 Last data filed at 08/17/2023 2440 Gross per 24 hour  Intake 149.64 ml  Output 550 ml  Net -400.36 ml   Filed Weights   08/16/23 1631 08/16/23 2015  Weight: 80.7 kg 80.1 kg    Physical Exam   General appearance: alert and no distress Lungs: clear to auscultation bilaterally Heart: regular rate and rhythm, S1, S2 normal, no murmur, click, rub or gallop Extremities: extremities normal, atraumatic, no cyanosis or edema Neurologic: Grossly normal  Inpatient Medications    Scheduled Meds:  aspirin EC  81 mg Oral Daily   clopidogrel  75 mg Oral Daily   ezetimibe  10 mg Oral Daily   fenofibrate  160 mg Oral Daily   heparin  5,000 Units Subcutaneous Q8H   irbesartan  300 mg Oral Daily   metoprolol succinate  50 mg Oral Daily   pantoprazole  40 mg Oral Daily   rosuvastatin  10 mg Oral QHS   tamsulosin  0.4 mg Oral QPC supper    Continuous Infusions:  sodium chloride 1 mL/kg/hr (08/17/23 1027)   nitroGLYCERIN Stopped (08/17/23 0624)     PRN Meds: acetaminophen, ondansetron (ZOFRAN) IV   Labs   Results for orders placed or performed during the hospital encounter of 08/16/23 (from the past 48 hour(s))  Basic metabolic panel     Status: Abnormal   Collection Time: 08/16/23  2:02 PM  Result Value Ref Range   Sodium 137 135 - 145 mmol/L   Potassium 4.0 3.5 - 5.1 mmol/L   Chloride 104 98 - 111 mmol/L   CO2 23 22 - 32 mmol/L   Glucose, Bld 148 (H) 70 - 99 mg/dL    Comment: Glucose reference range applies only to samples taken after fasting for at least 8 hours.   BUN 13 8 - 23 mg/dL   Creatinine, Ser 2.53 0.61 - 1.24 mg/dL   Calcium 9.5 8.9 - 66.4 mg/dL   GFR, Estimated >40 >34 mL/min    Comment: (NOTE) Calculated using the CKD-EPI Creatinine Equation (2021)    Anion gap 10 5 - 15    Comment: Performed at [facility], [address] [place], [place] [address]  CBC     Status: Abnormal   Collection Time: 08/16/23  2:02 PM  Result Value Ref Range   WBC 7.0 4.0 - 10.5 K/uL   RBC  4.33 4.22 - 5.81 MIL/uL   Hemoglobin 12.7 (L) 13.0 - 17.0 g/dL   HCT 56.2 (L) 13.0 - 86.5 %   MCV 85.9 80.0 - 100.0 fL   MCH 29.3 26.0 - 34.0 pg   MCHC 34.1 30.0 - 36.0 g/dL   RDW 78.4 69.6 - 29.5 %   Platelets 227 150 - 400 K/uL   nRBC 0.0 0.0 - 0.2 %    Comment: Performed at [facility], [address], [place], [place] [address]  Troponin I (High Sensitivity)     Status: None   Collection Time: 08/16/23  2:02 PM  Result Value Ref Range   Troponin I (High Sensitivity) 5 <18 ng/L    Comment: (NOTE) Elevated high sensitivity troponin I (hsTnI) values and significant  changes across serial measurements may suggest ACS but many other  chronic and acute conditions are known to elevate hsTnI results.  Refer to the "Links" section for chest pain algorithms and additional  guidance. Performed at [facility], [address], [place], [place] [address]   Troponin I (High Sensitivity)     Status: None   Collection  Time: 08/16/23  4:33 PM  Result Value Ref Range   Troponin I (High Sensitivity) 7 <18 ng/L    Comment: (NOTE) Elevated high sensitivity troponin I (hsTnI) values and significant  changes across serial measurements may suggest ACS but many other  chronic and acute conditions are known to elevate hsTnI results.  Refer to the "Links" section for chest pain algorithms and additional  guidance. Performed at [facility], [address] [place], [place] [address]   CBC     Status: Abnormal   Collection Time: 08/16/23  8:24 PM  Result Value Ref Range   WBC 7.4 4.0 - 10.5 K/uL   RBC 4.21 (L) 4.22 - 5.81 MIL/uL   Hemoglobin 12.9 (L) 13.0 - 17.0 g/dL   HCT 36.6 (L) 44.0 - 34.7 %   MCV 87.2 80.0 - 100.0 fL   MCH 30.6 26.0 - 34.0 pg   MCHC 35.1 30.0 - 36.0 g/dL   RDW 42.5 95.6 - 38.7 %   Platelets 202 150 - 400 K/uL   nRBC 0.0 0.0 - 0.2 %    Comment: Performed at [facility], [address], [place], [place] [address]  Creatinine, serum     Status: None   Collection Time: 08/16/23  8:24 PM  Result Value Ref Range   Creatinine, Ser 1.13 0.61 - 1.24 mg/dL   GFR, Estimated >29 >51 mL/min    Comment: (NOTE) Calculated using the CKD-EPI Creatinine Equation (2021) Performed at [facility], [address] [place], [place] [address]   Basic metabolic panel     Status: None   Collection Time: 08/17/23  4:51 AM  Result Value Ref Range   Sodium 137 135 - 145 mmol/L   Potassium 4.0 3.5 - 5.1 mmol/L   Chloride 103 98 - 111 mmol/L   CO2 27 22 - 32 mmol/L   Glucose, Bld 96 70 - 99 mg/dL    Comment: Glucose reference range applies only to samples taken after fasting for at least 8 hours.   BUN 13 8 - 23 mg/dL   Creatinine, Ser 6.06 0.61 - 1.24 mg/dL   Calcium 9.4 8.9 - 30.1 mg/dL   GFR, Estimated >60 >10 mL/min    Comment: (NOTE) Calculated using the CKD-EPI Creatinine Equation (2021)    Anion gap  7 5 - 15    Comment: Performed at [facility], [address], [place], [place] [address]  Lipid panel     Status: Abnormal   Collection Time: 08/17/23  4:51 AM  Result Value Ref Range   Cholesterol 114 0 - 200 mg/dL   Triglycerides 604 (H) <150 mg/dL   HDL 35 (L) >54 mg/dL   Total CHOL/HDL Ratio 3.3 RATIO   VLDL 36 0 - 40 mg/dL   LDL Cholesterol 43 0 - 99 mg/dL    Comment:        Total Cholesterol/HDL:CHD Risk Coronary Heart Disease Risk Table                     Men   Women  1/2 Average Risk   3.4   3.3  Average Risk       5.0   4.4  2 X Average Risk   9.6   7.1  3 X Average Risk  23.4   11.0        Use the calculated Patient Ratio above and the CHD Risk Table to determine the patient's CHD Risk.        ATP III CLASSIFICATION (LDL):  <100     mg/dL   Optimal  098-119  mg/dL   Near or Above                    Optimal  130-159  mg/dL   Borderline  147-829  mg/dL   High  >562     mg/dL   Very High Performed at [facility], [address], [place], [place] [address]   CBC     Status: Abnormal   Collection Time: 08/17/23  4:51 AM  Result Value Ref Range   WBC 8.4 4.0 - 10.5 K/uL   RBC 4.47 4.22 - 5.81 MIL/uL   Hemoglobin 13.4 13.0 - 17.0 g/dL   HCT 57.8 (L) 46.9 - 62.9 %   MCV 87.0 80.0 - 100.0 fL   MCH 30.0 26.0 - 34.0 pg   MCHC 34.4 30.0 - 36.0 g/dL   RDW 52.8 41.3 - 24.4 %   Platelets 222 150 - 400 K/uL   nRBC 0.0 0.0 - 0.2 %    Comment: Performed at [facility], [address], [place], [place] [address]    ECG   N/A  Telemetry   Normal sinus rhythm - Personally Reviewed  Radiology    DG Chest 2 View  Result Date: 08/16/2023 CLINICAL DATA:  Chest pain. EXAM: CHEST - 2 VIEW COMPARISON:  10/05/2014. FINDINGS: Bilateral lung fields are clear. Bilateral costophrenic angles are clear. Normal cardio-mediastinal silhouette. Status post CABG.  Aortic arch calcifications noted. No acute osseous abnormalities. Lower cervical spinal fixation hardware seen. The soft tissues are within normal limits. IMPRESSION: No  acute cardiopulmonary process. Aortic Atherosclerosis (ICD10-I70.0). Electronically Signed   By: Jules Schick M.D.   On: 08/16/2023 14:48    Cardiac Studies   N/A  Assessment   Principal Problem:   Unstable angina pectoris (HCC) Active Problems:   S/P CABG x 1   Essential hypertension   Mixed hyperlipidemia   Unstable angina (HCC)   Plan   Chest pain overnight- hypertensive, placed on a nitroglycerin gtts, but not running this morning. Not given am BP meds yet. Labs normal. LDL <70. Plan for [facility] today with Dr. Herbie Baltimore.  Time Spent Directly with Patient:  I have  spent a total of 25 minutes with the patient reviewing hospital notes, telemetry, EKGs, labs and examining the patient as well as establishing an assessment and plan that was discussed personally with the patient.  > 50% of time was spent in direct patient care.  Length of Stay:  LOS: 0 days   [name], MD, [facility], FACP  [place]  [facility]  Medical Director of the Advanced Lipid Disorders &  Cardiovascular Risk Reduction Clinic Diplomate of the American Board of Clinical Lipidology Attending Cardiologist  Direct Dial: [phone]  Fax: [phone]  Website:  [email or web address] 08/17/2023, 8:48 AM

## 2023-08-17 NOTE — Plan of Care (Signed)
  Problem: Activity: Goal: Ability to return to baseline activity level will improve Outcome: Progressing   Problem: Cardiovascular: Goal: Ability to achieve and maintain adequate cardiovascular perfusion will improve Outcome: Progressing Goal: Vascular access site(s) Level 0-1 will be maintained Outcome: Progressing   

## 2023-08-18 ENCOUNTER — Other Ambulatory Visit (HOSPITAL_COMMUNITY): Payer: Self-pay

## 2023-08-18 DIAGNOSIS — E782 Mixed hyperlipidemia: Secondary | ICD-10-CM | POA: Diagnosis not present

## 2023-08-18 DIAGNOSIS — Z951 Presence of aortocoronary bypass graft: Secondary | ICD-10-CM | POA: Diagnosis not present

## 2023-08-18 DIAGNOSIS — Z7902 Long term (current) use of antithrombotics/antiplatelets: Secondary | ICD-10-CM | POA: Diagnosis not present

## 2023-08-18 DIAGNOSIS — I25110 Atherosclerotic heart disease of native coronary artery with unstable angina pectoris: Secondary | ICD-10-CM | POA: Diagnosis not present

## 2023-08-18 DIAGNOSIS — I10 Essential (primary) hypertension: Secondary | ICD-10-CM | POA: Diagnosis not present

## 2023-08-18 DIAGNOSIS — Z87891 Personal history of nicotine dependence: Secondary | ICD-10-CM | POA: Diagnosis not present

## 2023-08-18 DIAGNOSIS — Z7982 Long term (current) use of aspirin: Secondary | ICD-10-CM | POA: Diagnosis not present

## 2023-08-18 DIAGNOSIS — Z79899 Other long term (current) drug therapy: Secondary | ICD-10-CM | POA: Diagnosis not present

## 2023-08-18 DIAGNOSIS — Z955 Presence of coronary angioplasty implant and graft: Secondary | ICD-10-CM | POA: Diagnosis not present

## 2023-08-18 DIAGNOSIS — I200 Unstable angina: Secondary | ICD-10-CM | POA: Diagnosis not present

## 2023-08-18 LAB — BASIC METABOLIC PANEL
Anion gap: 8 (ref 5–15)
BUN: 15 mg/dL (ref 8–23)
CO2: 22 mmol/L (ref 22–32)
Calcium: 8.6 mg/dL — ABNORMAL LOW (ref 8.9–10.3)
Chloride: 105 mmol/L (ref 98–111)
Creatinine, Ser: 1.22 mg/dL (ref 0.61–1.24)
GFR, Estimated: >60 mL/min (ref >60)
Glucose, Bld: 94 mg/dL (ref 70–99)
Potassium: 3.9 mmol/L (ref 3.5–5.1)
Sodium: 135 mmol/L (ref 135–145)

## 2023-08-18 LAB — CBC
HCT: 31.9 % — ABNORMAL LOW (ref 39.0–52.0)
Hemoglobin: 10.8 g/dL — ABNORMAL LOW (ref 13.0–17.0)
MCH: 29.3 pg (ref 26.0–34.0)
MCHC: 33.9 g/dL (ref 30.0–36.0)
MCV: 86.4 fL (ref 80.0–100.0)
Platelets: 189 10*3/uL (ref 150–400)
RBC: 3.69 MIL/uL — ABNORMAL LOW (ref 4.22–5.81)
RDW: 13.4 % (ref 11.5–15.5)
WBC: 7.8 10*3/uL (ref 4.0–10.5)
nRBC: 0 % (ref 0.0–0.2)

## 2023-08-18 MED ORDER — RANOLAZINE ER 500 MG PO TB12
1000.0000 mg | ORAL_TABLET | Freq: Two times a day (BID) | ORAL | Status: DC
  Administered 2023-08-18: 1000 mg via ORAL
  Filled 2023-08-18: qty 2

## 2023-08-18 MED ORDER — ISOSORBIDE MONONITRATE ER 60 MG PO TB24
120.0000 mg | ORAL_TABLET | Freq: Every day | ORAL | Status: DC
  Administered 2023-08-18: 120 mg via ORAL
  Filled 2023-08-18: qty 2

## 2023-08-18 MED ORDER — ISOSORBIDE MONONITRATE ER 120 MG PO TB24: 120.0000 mg | ORAL_TABLET | Freq: Every day | ORAL | 3 refills | Status: DC

## 2023-08-18 MED ORDER — RANOLAZINE ER 1000 MG PO TB12
1000.0000 mg | ORAL_TABLET | Freq: Two times a day (BID) | ORAL | 3 refills | Status: DC
  Filled 2023-08-18: qty 180, 90d supply, fill #0

## 2023-08-18 MED ORDER — RANOLAZINE ER 1000 MG PO TB12: 1000.0000 mg | ORAL_TABLET | Freq: Two times a day (BID) | ORAL | 3 refills | Status: DC

## 2023-08-18 MED ORDER — ISOSORBIDE MONONITRATE ER 120 MG PO TB24
120.0000 mg | ORAL_TABLET | Freq: Every day | ORAL | 3 refills | Status: DC
  Filled 2023-08-18 (×2): qty 90, 90d supply, fill #0

## 2023-08-18 MED ORDER — TAMSULOSIN HCL 0.4 MG PO CAPS: 0.4000 mg | ORAL_CAPSULE | Freq: Every day | ORAL | Status: AC

## 2023-08-18 NOTE — Progress Notes (Signed)
DAILY PROGRESS NOTE   Patient Name: Jonathan [name] Date of Encounter: 08/18/2023 Cardiologist: [name], MD  Chief Complaint   More chest pain post-cath  Patient Profile   71 yo male with known CAD and recent PCI to the ostial LCX in 10/2022, presents with nitrate responsive chest pain concerning for unstable angina.   Subjective   Underwent cath yesterday which shows severe ISR of the ostial LCX - this was not amenable to PCI, after multiple attempts and a prolonged procedure with multiple balloons that continued to burst.  Afterward, he was experiencing more angina, that ultimately improved on IV nitroglycerin and with morphine.     Objective   Vitals:   08/17/23 2031 08/18/23 0558 08/18/23 0735 08/18/23 0925  BP: (!) 106/50 (!) 139/58 (!) 139/58 135/73  Pulse: 77 67 66   Resp: 18 18    Temp:  98.5 F (36.9 C) 98.4 F (36.9 C)   TempSrc:  Oral Oral   SpO2: 97% 98% 100%   Weight:      Height:        Intake/Output Summary (Last 24 hours) at 08/18/2023 0938 Last data filed at 08/18/2023 0411 Gross per 24 hour  Intake 537.23 ml  Output 500 ml  Net 37.23 ml   Filed Weights   08/16/23 1631 08/16/23 2015  Weight: 80.7 kg 80.1 kg    Physical Exam   General appearance: alert and no distress Lungs: clear to auscultation bilaterally Heart: regular rate and rhythm, S1, S2 normal, no murmur, click, rub or gallop Extremities: extremities normal, atraumatic, no cyanosis or edema Neurologic: Grossly normal  Inpatient Medications    Scheduled Meds:  aspirin EC  81 mg Oral Daily   ezetimibe  10 mg Oral Daily   fenofibrate  160 mg Oral Daily   heparin  5,000 Units Subcutaneous Q8H   irbesartan  300 mg Oral Daily   metoprolol succinate  50 mg Oral Daily   pantoprazole  40 mg Oral Daily   rosuvastatin  10 mg Oral QHS   sodium chloride flush  3 mL Intravenous Q12H   tamsulosin  0.4 mg Oral QPC supper    Continuous Infusions:  sodium chloride      nitroGLYCERIN 20 mcg/min (08/17/23 2034)    PRN Meds: sodium chloride, acetaminophen, morphine injection, ondansetron (ZOFRAN) IV, sodium chloride flush   Labs   Results for orders placed or performed during the hospital encounter of 08/16/23 (from the past 48 hour(s))  Basic metabolic panel     Status: Abnormal   Collection Time: 08/16/23  2:02 PM  Result Value Ref Range   Sodium 137 135 - 145 mmol/L   Potassium 4.0 3.5 - 5.1 mmol/L   Chloride 104 98 - 111 mmol/L   CO2 23 22 - 32 mmol/L   Glucose, Bld 148 (H) 70 - 99 mg/dL    Comment: Glucose reference range applies only to samples taken after fasting for at least 8 hours.   BUN 13 8 - 23 mg/dL   Creatinine, Ser 1.61 0.61 - 1.24 mg/dL   Calcium 9.5 8.9 - 09.6 mg/dL   GFR, Estimated >04 >54 mL/min    Comment: (NOTE) Calculated using the CKD-EPI Creatinine Equation (2021)    Anion gap 10 5 - 15    Comment: Performed at [facility], [address], [place], [place] [address]  CBC     Status: Abnormal   Collection Time: 08/16/23  2:02 PM  Result  Value Ref Range   WBC 7.0 4.0 - 10.5 K/uL   RBC 4.33 4.22 - 5.81 MIL/uL   Hemoglobin 12.7 (L) 13.0 - 17.0 g/dL   HCT 16.1 (L) 09.6 - 04.5 %   MCV 85.9 80.0 - 100.0 fL   MCH 29.3 26.0 - 34.0 pg   MCHC 34.1 30.0 - 36.0 g/dL   RDW 40.9 81.1 - 91.4 %   Platelets 227 150 - 400 K/uL   nRBC 0.0 0.0 - 0.2 %    Comment: Performed at [facility], [address] [place], [place] [address]  Troponin I (High Sensitivity)     Status: None   Collection Time: 08/16/23  2:02 PM  Result Value Ref Range   Troponin I (High Sensitivity) 5 <18 ng/L    Comment: (NOTE) Elevated high sensitivity troponin I (hsTnI) values and significant  changes across serial measurements may suggest ACS but many other  chronic and acute conditions are known to elevate hsTnI results.  Refer to the "Links" section for chest pain algorithms and additional  guidance. Performed at [facility],  [address] [place], [place] [address]   Troponin I (High Sensitivity)     Status: None   Collection Time: 08/16/23  4:33 PM  Result Value Ref Range   Troponin I (High Sensitivity) 7 <18 ng/L    Comment: (NOTE) Elevated high sensitivity troponin I (hsTnI) values and significant  changes across serial measurements may suggest ACS but many other  chronic and acute conditions are known to elevate hsTnI results.  Refer to the "Links" section for chest pain algorithms and additional  guidance. Performed at [facility], [address], [place], [place] [address]   CBC     Status: Abnormal   Collection Time: 08/16/23  8:24 PM  Result Value Ref Range   WBC 7.4 4.0 - 10.5 K/uL   RBC 4.21 (L) 4.22 - 5.81 MIL/uL   Hemoglobin 12.9 (L) 13.0 - 17.0 g/dL   HCT 46.9 (L) 62.9 - 52.8 %   MCV 87.2 80.0 - 100.0 fL   MCH 30.6 26.0 - 34.0 pg   MCHC 35.1 30.0 - 36.0 g/dL   RDW 41.3 24.4 - 01.0 %   Platelets 202 150 - 400 K/uL   nRBC 0.0 0.0 - 0.2 %    Comment: Performed at [facility], [address], [place], [place] [address]  Creatinine, serum     Status: None   Collection Time: 08/16/23  8:24 PM  Result Value Ref Range   Creatinine, Ser 1.13 0.61 - 1.24 mg/dL   GFR, Estimated >66 >44 mL/min    Comment: (NOTE) Calculated using the CKD-EPI Creatinine Equation (2021) Performed at [facility], [address] [place], [place] [address]   Basic metabolic panel     Status: None   Collection Time: 08/17/23  4:51 AM  Result Value Ref Range   Sodium 137 135 - 145 mmol/L   Potassium 4.0 3.5 - 5.1 mmol/L   Chloride 103 98 - 111 mmol/L   CO2 27 22 - 32 mmol/L   Glucose, Bld 96 70 - 99 mg/dL    Comment: Glucose reference range applies only to samples taken after fasting for at least 8 hours.   BUN 13 8 - 23 mg/dL   Creatinine, Ser 2.59 0.61 - 1.24 mg/dL   Calcium 9.4 8.9 - 56.3 mg/dL   GFR, Estimated >87 >56 mL/min  Comment: (NOTE) Calculated using the CKD-EPI Creatinine  Equation (2021)    Anion gap 7 5 - 15    Comment: Performed at [facility], [address] [place], [place] [address]  Lipid panel     Status: Abnormal   Collection Time: 08/17/23  4:51 AM  Result Value Ref Range   Cholesterol 114 0 - 200 mg/dL   Triglycerides 604 (H) <150 mg/dL   HDL 35 (L) >54 mg/dL   Total CHOL/HDL Ratio 3.3 RATIO   VLDL 36 0 - 40 mg/dL   LDL Cholesterol 43 0 - 99 mg/dL    Comment:        Total Cholesterol/HDL:CHD Risk Coronary Heart Disease Risk Table                     Men   Women  1/2 Average Risk   3.4   3.3  Average Risk       5.0   4.4  2 X Average Risk   9.6   7.1  3 X Average Risk  23.4   11.0        Use the calculated Patient Ratio above and the CHD Risk Table to determine the patient's CHD Risk.        ATP III CLASSIFICATION (LDL):  <100     mg/dL   Optimal  098-119  mg/dL   Near or Above                    Optimal  130-159  mg/dL   Borderline  147-829  mg/dL   High  >562     mg/dL   Very High Performed at [facility], [address], [place], [place] [address]   CBC     Status: Abnormal   Collection Time: 08/17/23  4:51 AM  Result Value Ref Range   WBC 8.4 4.0 - 10.5 K/uL   RBC 4.47 4.22 - 5.81 MIL/uL   Hemoglobin 13.4 13.0 - 17.0 g/dL   HCT 57.8 (L) 46.9 - 62.9 %   MCV 87.0 80.0 - 100.0 fL   MCH 30.0 26.0 - 34.0 pg   MCHC 34.4 30.0 - 36.0 g/dL   RDW 52.8 41.3 - 24.4 %   Platelets 222 150 - 400 K/uL   nRBC 0.0 0.0 - 0.2 %    Comment: Performed at [facility], [address] [place], [place] [address]  POCT Activated clotting time     Status: None   Collection Time: 08/17/23  2:45 PM  Result Value Ref Range   Activated Clotting Time 238 seconds    Comment: Reference range 74-137 seconds for patients not on anticoagulant therapy.  POCT Activated clotting time     Status: None   Collection Time: 08/17/23  2:57 PM  Result Value Ref Range   Activated Clotting Time 256 seconds    Comment: Reference range 74-137  seconds for patients not on anticoagulant therapy.  POCT Activated clotting time     Status: None   Collection Time: 08/17/23  3:10 PM  Result Value Ref Range   Activated Clotting Time 635 seconds    Comment: Reference range 74-137 seconds for patients not on anticoagulant therapy.  POCT Activated clotting time     Status: None   Collection Time: 08/17/23  3:30 PM  Result Value Ref Range   Activated Clotting Time 299 seconds    Comment: Reference range 74-137 seconds for patients  not on anticoagulant therapy.  Basic metabolic panel     Status: Abnormal   Collection Time: 08/18/23  3:36 AM  Result Value Ref Range   Sodium 135 135 - 145 mmol/L   Potassium 3.9 3.5 - 5.1 mmol/L   Chloride 105 98 - 111 mmol/L   CO2 22 22 - 32 mmol/L   Glucose, Bld 94 70 - 99 mg/dL    Comment: Glucose reference range applies only to samples taken after fasting for at least 8 hours.   BUN 15 8 - 23 mg/dL   Creatinine, Ser 9.56 0.61 - 1.24 mg/dL   Calcium 8.6 (L) 8.9 - 10.3 mg/dL   GFR, Estimated >21 >30 mL/min    Comment: (NOTE) Calculated using the CKD-EPI Creatinine Equation (2021)    Anion gap 8 5 - 15    Comment: Performed at [facility], [address], [place], [place] [address]  CBC     Status: Abnormal   Collection Time: 08/18/23  3:36 AM  Result Value Ref Range   WBC 7.8 4.0 - 10.5 K/uL   RBC 3.69 (L) 4.22 - 5.81 MIL/uL   Hemoglobin 10.8 (L) 13.0 - 17.0 g/dL   HCT 46.9 (L) 62.9 - 52.8 %   MCV 86.4 80.0 - 100.0 fL   MCH 29.3 26.0 - 34.0 pg   MCHC 33.9 30.0 - 36.0 g/dL   RDW 41.3 24.4 - 01.0 %   Platelets 189 150 - 400 K/uL   nRBC 0.0 0.0 - 0.2 %    Comment: Performed at [facility], [address] [place], [place] [address]    ECG   N/A  Telemetry   Normal sinus rhythm - Personally Reviewed  Radiology    CARDIAC CATHETERIZATION  Result Date: 08/17/2023   Ost RCA to Dist RCA lesion is 100% stenosed.   Ost Cx lesion is 90% stenosed.   Scoring balloon angioplasty  was performed using a BALLN WOLVERINE 3.00X10.   Post intervention, there is a 90% residual stenosis.   Right radial artery and is normal in caliber.   The graft exhibits no disease.   There is no competitive flow   LV end diastolic pressure is mildly elevated. 2 vessel obstructive CAD. Severe in stent restenosis at the ostium of the LCx. Chronic occlusion of the RCA. Continued patency of the stent in the proximal LAD Patent free radial graft to the PDA Mildly elevated LVEDP 18 mm Hg Unsuccessful PCI of the ostium of the LCx. Unable to expand lesion due to the fact that multiple balloons ruptured in the lesion at low inflation pressures. Unsuccessful Shockwave lithotripsy. Plan: PCI option very limited at this point. Could consider Rotational atherectomy which may possible smooth out whatever is causing balloon rupture. However, rotational atherectomy  is off label for in stent disease and even if lesion could eventually be expanded restenosis rate will be higher. The other option to consider would be CABG.   DG Chest 2 View  Result Date: 08/16/2023 CLINICAL DATA:  Chest pain. EXAM: CHEST - 2 VIEW COMPARISON:  10/05/2014. FINDINGS: Bilateral lung fields are clear. Bilateral costophrenic angles are clear. Normal cardio-mediastinal silhouette. Status post CABG.  Aortic arch calcifications noted. No acute osseous abnormalities. Lower cervical spinal fixation hardware seen. The soft tissues are within normal limits. IMPRESSION: No acute cardiopulmonary process. Aortic Atherosclerosis (ICD10-I70.0). Electronically Signed   By: Jules Schick M.D.   On: 08/16/2023 14:48    Cardiac Studies   N/A  Assessment   Principal Problem:   Unstable angina pectoris (HCC) Active Problems:   S/P CABG x 1   Essential hypertension   Mixed hyperlipidemia   Unstable angina (HCC)   Plan   No further chest pain on nitroglycerin gtts.  Will d/c and start imdur 120 mg daily. Restart ranexa 1000 mg BID. He is amenable to  considering options for CABG.  Ok to d/c home today. Has follow-up with me in December, but will need earlier APP follow-up in 2-3 weeks. I have send a message to CT surgery regarding an appt with Dr. Laneta Simmers.  Time Spent Directly with Patient:  I have spent a total of 25 minutes with the patient reviewing hospital notes, telemetry, EKGs, labs and examining the patient as well as establishing an assessment and plan that was discussed personally with the patient.  > 50% of time was spent in direct patient care.  Length of Stay:  LOS: 0 days   [name], MD, [facility], FACP  [place]  [facility]  Medical Director of the Advanced Lipid Disorders &  Cardiovascular Risk Reduction Clinic Diplomate of the American Board of Clinical Lipidology Attending Cardiologist  Direct Dial: [phone]  Fax: [phone]  Website:  [email or web address] 08/18/2023, 9:38 AM

## 2023-08-18 NOTE — Plan of Care (Signed)
  Problem: Education: Goal: Understanding of CV disease, CV risk reduction, and recovery process will improve Outcome: Progressing Goal: Individualized Educational Video(s) Outcome: Progressing   Problem: Activity: Goal: Ability to return to baseline activity level will improve Outcome: Progressing   Problem: Cardiovascular: Goal: Ability to achieve and maintain adequate cardiovascular perfusion will improve Outcome: Progressing Goal: Vascular access site(s) Level 0-1 will be maintained Outcome: Progressing   Problem: Health Behavior/Discharge Planning: Goal: Ability to safely manage health-related needs after discharge will improve Outcome: Progressing   Problem: Activity: Goal: Ability to tolerate increased activity will improve Outcome: Progressing   Problem: Cardiac: Goal: Ability to achieve and maintain adequate cardiovascular perfusion will improve Outcome: Progressing   Problem: Clinical Measurements: Goal: Ability to maintain clinical measurements within normal limits will improve Outcome: Progressing Goal: Will remain free from infection Outcome: Progressing Goal: Diagnostic test results will improve Outcome: Progressing Goal: Respiratory complications will improve Outcome: Progressing Goal: Cardiovascular complication will be avoided Outcome: Progressing   Problem: Nutrition: Goal: Adequate nutrition will be maintained Outcome: Progressing   Problem: Pain Managment: Goal: General experience of comfort will improve Outcome: Progressing   Problem: Safety: Goal: Ability to remain free from injury will improve Outcome: Progressing

## 2023-08-18 NOTE — Discharge Summary (Signed)
Discharge Summary    Patient ID: Jonathan [name] MRN: [id number]; DOB: [birth year]  Admit date: 08/16/2023 Discharge date: 08/18/2023  PCP:  Assunta Found, MD   [facility] Providers Cardiologist:  [name], MD        Discharge Diagnoses    Principal Problem:   Unstable angina pectoris [facility]) Active Problems:   S/P CABG x 1   Essential hypertension   Mixed hyperlipidemia   Unstable angina [facility])    Diagnostic Studies/Procedures    Left heart cath 08/17/23:   Ost RCA to Dist RCA lesion is 100% stenosed.   Ost Cx lesion is 90% stenosed.   Scoring balloon angioplasty was performed using a BALLN WOLVERINE 3.00X10.   Post intervention, there is a 90% residual stenosis.   Right radial artery and is normal in caliber.   The graft exhibits no disease.   There is no competitive flow   LV end diastolic pressure is mildly elevated.   2 vessel obstructive CAD. Severe in stent restenosis at the ostium of the LCx. Chronic occlusion of the RCA. Continued patency of the stent in the proximal LAD Patent free radial graft to the PDA Mildly elevated LVEDP 18 mm Hg Unsuccessful PCI of the ostium of the LCx. Unable to expand lesion due to the fact that multiple balloons ruptured in the lesion at low inflation pressures. Unsuccessful Shockwave lithotripsy.    Plan: PCI option very limited at this point. Could consider Rotational atherectomy which may possible smooth out whatever is causing balloon rupture. However, rotational atherectomy  is off label for in stent disease and even if lesion could eventually be expanded restenosis rate will be higher. The other option to consider would be CABG.  _____________   History of Present Illness     Jonathan [name] is a 72 y.o. male with a history of CAD s/p multiple PCIs and CABG x 1 with radial graft to PDA 2009, PAD with prior right common iliac and femoral artery stenting in 2003.   More recently he had progressive chest pain and  underwent myoview stress testing in 08/2022, which was abnormal, showing a reversible inferior perfusion defect. He was sent to cardiac catheterization and found to have an 80% ostial LCx stenosis which was stented. When I saw him in follow-up he was doing well and I stopped his Ranexa with plans to stop his long-acting nitrate in a few months. He then saw Bernadene Person, NP about 3 months later and had reported continued intermittent nitroglycerin use, so his imdur was increased to 60 mg daily. He was seen again in June with ongoing symptoms and the imdur was further increased to 90 mg daily.  He now presents with chest pain that started a few hours PTA. He reported continued nitroglycerin use with only minor relief. Troponin here, however, has been negative at 5 and 7.  Symptoms, however, are suggestive of unstable angina.   Decision was made to obtain definitive angiography.   Hospital Course     Consultants: none  Unstable angina Hx of CAD with recent LCX PCI 10/2022 S/P CABG x 1 with radial graft to dRCA/PDA 2009 (Dr. Laneta Simmers) Troponin remained negative, however presented with chest pain responsive to nitroglycerin. Heart catheterization as noted above with failed PCI. Severe ISR of the ostial LCX that was not amenable to PCI after multiple attempts and multiple balloons that ruptured at low inflation pressures. Unsuccessful shockwave lithotripsy. Alternatives moving forward include rotational atherectomy, but this would be  considered off label for ISR disease. Another option is consideration for CABG, which he is amenable to - coordinator messaged through epic. Will see CT surgery outpatient to determine next steps.   Chest pain following heart cath. EKG stable. Started on nitroglycerin gtt with PRN morphine. CP resolved. Nitroglycerin gtt stopped and started on 120 mg imdur. Ranexa 1000 mg BID started.  Continue ASA and plavix.    Hyperlipidemia with LDL goal < 70 08/17/2023: Cholesterol 114;  HDL 35; LDL Cholesterol 43; Triglycerides 178; VLDL 36 LP (a) 41.3  Continue zetia, fenofibrate, and 10 mg crestor   Hypertension Increase imdur from 90 to 120 mg.  Continue telmisartan, toprol.   Pt seen and examined by Dr. Rennis Golden and deemed stable for discharge. Follow up has been arranged.      Did the patient have an acute coronary syndrome (MI, NSTEMI, STEMI, etc) this admission?:  No                               Did the patient have a percutaneous coronary intervention (stent / angioplasty)?:  Yes.     Cath/PCI Registry Performance & Quality Measures: Aspirin prescribed? - Yes ADP Receptor Inhibitor (Plavix/Clopidogrel, Brilinta/Ticagrelor or Effient/Prasugrel) prescribed (includes medically managed patients)? - Yes High Intensity Statin (Lipitor 40-80mg  or Crestor 20-40mg ) prescribed? - No - intolerance For EF <40%, was ACEI/ARB prescribed? - Yes For EF <40%, Aldosterone Antagonist (Spironolactone or Eplerenone) prescribed? - Not Applicable (EF >/= 40%) Cardiac Rehab Phase II ordered? - Yes       The patient will be scheduled for a TOC follow up appointment in 7*14 days.  A message has been sent to the [facility] and Scheduling Pool at the office where the patient should be seen for follow up.  _____________  Discharge Vitals Blood pressure 135/73, pulse 66, temperature 98.4 F (36.9 C), temperature source Oral, resp. rate 18, height 5' 7.5" (1.715 m), weight 80.1 kg, SpO2 100%.  Filed Weights   08/16/23 1631 08/16/23 2015  Weight: 80.7 kg 80.1 kg    Labs & Radiologic Studies    CBC Recent Labs    08/17/23 0451 08/18/23 0336  WBC 8.4 7.8  HGB 13.4 10.8*  HCT 38.9* 31.9*  MCV 87.0 86.4  PLT 222 189   Basic Metabolic Panel Recent Labs    29/56/21 0451 08/18/23 0336  NA 137 135  K 4.0 3.9  CL 103 105  CO2 27 22  GLUCOSE 96 94  BUN 13 15  CREATININE 1.09 1.22  CALCIUM 9.4 8.6*   Liver Function Tests No results for input(s): "AST", "ALT", "ALKPHOS",  "BILITOT", "PROT", "ALBUMIN" in the last 72 hours. No results for input(s): "LIPASE", "AMYLASE" in the last 72 hours. High Sensitivity Troponin:   Recent Labs  Lab 08/16/23 1402 08/16/23 1633  TROPONINIHS 5 7    BNP Invalid input(s): "POCBNP" D-Dimer No results for input(s): "DDIMER" in the last 72 hours. Hemoglobin A1C No results for input(s): "HGBA1C" in the last 72 hours. Fasting Lipid Panel Recent Labs    08/17/23 0451  CHOL 114  HDL 35*  LDLCALC 43  TRIG 308*  CHOLHDL 3.3   Thyroid Function Tests No results for input(s): "TSH", "T4TOTAL", "T3FREE", "THYROIDAB" in the last 72 hours.  Invalid input(s): "FREET3" _____________  CARDIAC CATHETERIZATION  Result Date: 08/17/2023   Ost RCA to Dist RCA lesion is 100% stenosed.   Ost Cx lesion is 90% stenosed.  Scoring balloon angioplasty was performed using a BALLN WOLVERINE 3.00X10.   Post intervention, there is a 90% residual stenosis.   Right radial artery and is normal in caliber.   The graft exhibits no disease.   There is no competitive flow   LV end diastolic pressure is mildly elevated. 2 vessel obstructive CAD. Severe in stent restenosis at the ostium of the LCx. Chronic occlusion of the RCA. Continued patency of the stent in the proximal LAD Patent free radial graft to the PDA Mildly elevated LVEDP 18 mm Hg Unsuccessful PCI of the ostium of the LCx. Unable to expand lesion due to the fact that multiple balloons ruptured in the lesion at low inflation pressures. Unsuccessful Shockwave lithotripsy. Plan: PCI option very limited at this point. Could consider Rotational atherectomy which may possible smooth out whatever is causing balloon rupture. However, rotational atherectomy  is off label for in stent disease and even if lesion could eventually be expanded restenosis rate will be higher. The other option to consider would be CABG.   DG Chest 2 View  Result Date: 08/16/2023 CLINICAL DATA:  Chest pain. EXAM: CHEST - 2 VIEW  COMPARISON:  10/05/2014. FINDINGS: Bilateral lung fields are clear. Bilateral costophrenic angles are clear. Normal cardio-mediastinal silhouette. Status post CABG.  Aortic arch calcifications noted. No acute osseous abnormalities. Lower cervical spinal fixation hardware seen. The soft tissues are within normal limits. IMPRESSION: No acute cardiopulmonary process. Aortic Atherosclerosis (ICD10-I70.0). Electronically Signed   By: Jules Schick M.D.   On: 08/16/2023 14:48   Disposition   Pt is being discharged home today in good condition.  Follow-up Plans & Appointments     Follow-up Information     Joylene Grapes, NP Follow up on 09/03/2023.   Specialties: Cardiology, Family Medicine Why: 8:25 am Contact information: [address] [place] [address] [phone]                Discharge Instructions     AMB Referral to Cardiac Rehabilitation - Phase II   Complete by: As directed    Unstable angina   Diagnosis: Other   After initial evaluation and assessments completed: Virtual Based Care may be provided alone or in conjunction with Phase 2 Cardiac Rehab based on patient barriers.: Yes   Intensive Cardiac Rehabilitation (ICR) [facility] location only OR Traditional Cardiac Rehabilitation (TCR) *If criteria for ICR are not met will enroll in TCR [facility] only): Yes   Diet - low sodium heart healthy   Complete by: As directed    Discharge instructions   Complete by: As directed    No driving for 2 days. No lifting over 5 lbs for 1 week. No sexual activity for 1 week. Keep procedure site clean & dry. If you notice increased pain, swelling, bleeding or pus, call/return!  You may shower, but no soaking baths/hot tubs/pools for 1 week.   Increase activity slowly   Complete by: As directed         Discharge Medications   Allergies as of 08/18/2023   No Known Allergies      Medication List     STOP taking these medications    esomeprazole 20 MG capsule Commonly  known as: NEXIUM   ibuprofen 200 MG tablet Commonly known as: ADVIL       TAKE these medications    Aspirin Low Dose 81 MG tablet Generic drug: aspirin EC Take 1 tablet (81 mg total) by mouth daily. Swallow whole.   clopidogrel  75 MG tablet Commonly known as: Plavix Take 1 tablet (75 mg total) by mouth daily.   ezetimibe 10 MG tablet Commonly known as: ZETIA Take 1 tablet (10 mg total) by mouth daily. Take 1 tablet by mouth daily.   fenofibrate 145 MG tablet Commonly known as: Tricor Take 1 tablet (145 mg total) by mouth daily.   isosorbide mononitrate 120 MG 24 hr tablet Commonly known as: IMDUR Take 1 tablet (120 mg total) by mouth daily. Start taking on: August 19, 2023 What changed:  medication strength how much to take   metoprolol succinate 50 MG 24 hr tablet Commonly known as: TOPROL-XL TAKE 1 TABLET BY MOUTH EVERY DAY WITH OR IMMEDIATELY FOLLOWING A MEAL.   nitroGLYCERIN 0.4 MG SL tablet Commonly known as: NITROSTAT Place 1 tablet (0.4 mg total) under the tongue every 5 (five) minutes as needed for chest pain.   pantoprazole 40 MG tablet Commonly known as: Protonix Take 1 tablet (40 mg total) by mouth daily.   ranolazine 1000 MG SR tablet Commonly known as: RANEXA Take 1 tablet (1,000 mg total) by mouth 2 (two) times daily.   rosuvastatin 10 MG tablet Commonly known as: CRESTOR Take 1 tablet (10 mg total) by mouth daily.   tamsulosin 0.4 MG Caps capsule Commonly known as: FLOMAX Take 1 capsule (0.4 mg total) by mouth daily after supper. What changed: when to take this   telmisartan 80 MG tablet Commonly known as: MICARDIS Take 1 tablet (80 mg total) by mouth daily.           Outstanding Labs/Studies   CT surgery consult OP  Duration of Discharge Encounter   Greater than 30 minutes including physician time.  Signed, Roe Rutherford Shristi Scheib, PA 08/18/2023, 11:51 AM

## 2023-08-20 ENCOUNTER — Encounter (HOSPITAL_COMMUNITY): Payer: Self-pay | Admitting: Cardiology

## 2023-08-21 ENCOUNTER — Encounter: Payer: Self-pay | Admitting: Surgery

## 2023-08-21 ENCOUNTER — Institutional Professional Consult (permissible substitution): Payer: PPO | Admitting: Surgery

## 2023-08-21 ENCOUNTER — Other Ambulatory Visit: Payer: Self-pay | Admitting: *Deleted

## 2023-08-21 VITALS — BP 179/72 | HR 64 | Resp 18 | Ht 67.0 in | Wt 180.0 lb

## 2023-08-21 DIAGNOSIS — I25119 Atherosclerotic heart disease of native coronary artery with unspecified angina pectoris: Secondary | ICD-10-CM

## 2023-08-21 NOTE — Progress Notes (Signed)
Cardiothoracic Surgery Consultation  PCP is Assunta Found, MD Referring Provider is [name], MD  Chief complaint: angina   HPI:   This patient is a 72 year old gentleman with history  of coronary disease status post multiple percutaneous interventions on  the right coronary artery with stents, cutting balloon angioplasty,  brachytherapy, as well as stenting of the proximal LAD in 2003.  He  presented with crescendo anginal symptoms in 05/2008.  Cardiac catheterization  showed severe in-stent restenosis within the right coronary artery  proximally and in the midportion.  There was also a stent in the distal  right coronary artery up to the takeoff of the posterior descending  branch which had no significant stenosis within that.  The posterior  descending and posterolateral branches were relatively small vessels.  The LAD stent had insignificant smooth less than 30% narrowing.  There  was insignificant left circumflex disease.  The left main was okay.  Left  ventricular function was normal. He then underwent CABG x 1 using a left radial artery graft to the PDA on 06/11/2008. He underwent PCI of an 80% ostial LCX in 10/2022 with a DES. He now presents with nitrate responsive chest pain consistent with unstable angina and cath shows severe ISR of the ostial LCX. The LAD had no significant stenosis with a patent stent. The RCA was chonically occluded with a patent radial artery graft to the PDA. This could not be opened with PCI after multiple attempts with multiple balloons rupturing. Shockwave lithotripsy was attempted but unsuccessful.   He is here today with his wife.  Since going home from his recent hospitalization he has had no chest discomfort.  He continues to be tired.  He denies any shortness of breath orthopnea.   Past Medical History:  Diagnosis Date   Anemia    CAD (coronary artery disease)    Femoral artery stenosis (HCC)    Gastroesophageal reflux    H/O hiatal  hernia    Hyperlipidemia    Hypertension    Myocardial infarction [facility]) 11/2001   PAD (peripheral artery disease) (HCC)    bilateral iliac artery stents    PONV (postoperative nausea and vomiting)    most of the time he gets nauseated   Prostate hyperplasia with urinary obstruction    S/P CABG x 1 2009   R radial to PDA   Tobacco consumption    quit cigarettes in 2008, smokes 5-6 cigars/week    Past Surgical History:  Procedure Laterality Date   ANTERIOR CERVICAL DECOMP/DISCECTOMY FUSION N/A 10/22/2014   Procedure: CERVICAL SIX SEVEN ANTERIOR CERVICAL DECOMPRESSION/DISCECTOMY FUSION  ;  Surgeon: Coletta Memos, MD;  Location: [facility] NEURO ORS;  Service: Neurosurgery;  Laterality: N/A;  C67 Possible C7T1 anterior cervical decompression with fusion plating and bonegraft   BACK SURGERY     CARDIAC CATHETERIZATION  12/03/2001   2.5x19mm S-660 stent to RCA (MI)   CARDIAC CATHETERIZATION  05/06/2002   PTCA & stenting of LAD and PTCA & stenting of mid/distal RCA with 3.0x60mm Zeta stent   CARDIAC CATHETERIZATION  06/03/2002   LAD with prox 30% in-stent restenosis; ramus with 95% ostial lesion; L Cfx & 3-OMs free of disease; mild 30% RCA narrowing & 20-30% in-stent restenosis in RCA (Dr. Laurell Josephs)   CARDIAC CATHETERIZATION  03/24/2003   overlapping tandem DES 3.0x23 Cypher stent to mid RCA for in-stent restenosis & new lesion beyond previous stent with 3.0x12 SciMed TAXUS (Dr. Jonette Eva)   CARDIAC CATHETERIZATION  12/08/2003  3.0x24 TAXUS overlapping 3.0x44mm TAXUS DES in RCA and cuttin balloon atherectomy (Dr. Jonette Eva)   CARDIAC CATHETERIZATION  07/10/2007   normal L main, L Cfx & 3-OMs free of disease; LAD with Zeta stent in prox region (patent); RCA with multiple stents (3.0x12 Taxus in ostium with 3.0x20 Taxus overlapping it, and series of 3.0x24 Tacus and Cypher stent overlapping this, and in distal RC 2x5x17mm Zeta(?) stent; ostium of RCA with 80% narrowing - 3x15 cutting balloon - 80%  to patient w/NO obstruction (Dr. Mervyn Skeeters. Little)   CARDIAC CATHETERIZATION  06/08/2008   high-grade osital & prox in-stent RCA restenosis - Dr. Jonette Eva   COLONOSCOPY N/A 11/22/2015   Procedure: COLONOSCOPY;  Surgeon: Corbin Ade, MD;  Location: [facility] ENDO SUITE;  Service: Endoscopy;  Laterality: N/A;  100   CORONARY ARTERY BYPASS GRAFT  2009   free right radial to PDA for progressive RCA disease & multiple re-stenosis (Dr. Wayland Salinas)   CORONARY BALLOON ANGIOPLASTY N/A 08/17/2023   Procedure: CORONARY BALLOON ANGIOPLASTY;  Surgeon: Swaziland, Peter M, MD;  Location: [facility] INVASIVE CV LAB;  Service: Cardiovascular;  Laterality: N/A;   CORONARY STENT INTERVENTION N/A 11/08/2022   Procedure: CORONARY STENT INTERVENTION;  Surgeon: Marykay Lex, MD;  Location: [facility] INVASIVE CV LAB;  Service: Cardiovascular;  Laterality: N/A;   CORONARY ULTRASOUND/IVUS N/A 08/17/2023   Procedure: Coronary Ultrasound/IVUS;  Surgeon: Swaziland, Peter M, MD;  Location: [facility] INVASIVE CV LAB;  Service: Cardiovascular;  Laterality: N/A;   ILIAC ARTERY STENT Bilateral 01/30/2002   9x39mm Cordis SmartStent to LCIA, 9x3.38mm Genesis stent to RCIA (Dr. Erlene Quan)   LEFT HEART CATH AND CORS/GRAFTS ANGIOGRAPHY N/A 11/08/2022   Procedure: LEFT HEART CATH AND CORS/GRAFTS ANGIOGRAPHY;  Surgeon: Marykay Lex, MD;  Location: [facility] INVASIVE CV LAB;  Service: Cardiovascular;  Laterality: N/A;   LEFT HEART CATH AND CORS/GRAFTS ANGIOGRAPHY N/A 08/17/2023   Procedure: LEFT HEART CATH AND CORS/GRAFTS ANGIOGRAPHY;  Surgeon: Swaziland, Peter M, MD;  Location: [facility] INVASIVE CV LAB;  Service: Cardiovascular;  Laterality: N/A;   Lower Extremity Arterial Doppler  2009   R CIA stent - less than 50% diameter reduction; R CFA stent - 0-49% diameter reduction; R SFA less than 50% diameter reduction; L CFA, profunda, SFA - equal/less than 50% diameter reduction   LUMBAR LAMINECTOMY/DECOMPRESSION MICRODISCECTOMY Bilateral 03/18/2015   Procedure: LUMBAR  LAMINECTOMY/DECOMPRESSION MICRODISCECTOMY 2 LEVELS;  Surgeon: Coletta Memos, MD;  Location: [facility] NEURO ORS;  Service: Neurosurgery;  Laterality: Bilateral;  Bilateral L34 L45 laminectomies   NM MYOCAR PERF WALL MOTION  07/2013   bruce myoview - intermediate risk test with abnormal inferior horizontal ST depression; mild, fixed inferobasal defect, but no ischemia   TRANSTHORACIC ECHOCARDIOGRAM  07/2013   EF 55-60%, mild LVH; mild MR; LA mildly dilated; RVSP increased; PA peak pressure (mod pulm HTN)    Family History  Problem Relation Age of Onset   Heart disease Father    CAD Brother        CABG   CAD Brother        CABG   Colon cancer Neg Hx     Social History Social History   Tobacco Use   Smoking status: Former    Current packs/day: 0.00    Average packs/day: 0.2 packs/day for 30.0 years (6.0 ttl pk-yrs)    Types: Cigarettes    Start date: 08/23/1992    Quit date: 08/23/2022    Years since quitting: 0.9   Smokeless tobacco: Never  Tobacco comments:    1 pk per week - cigarettes  Substance Use Topics   Alcohol use: Yes    Alcohol/week: 12.0 standard drinks of alcohol    Types: 12 Cans of beer per week    Comment: beer 2-3 times per week   Drug use: No    Types: Marijuana    Current Outpatient Medications  Medication Sig Dispense Refill   aspirin EC 81 MG tablet Take 1 tablet (81 mg total) by mouth daily. Swallow whole. 30 tablet 5   clopidogrel (PLAVIX) 75 MG tablet Take 1 tablet (75 mg total) by mouth daily. 30 tablet 11   ezetimibe (ZETIA) 10 MG tablet Take 1 tablet (10 mg total) by mouth daily. Take 1 tablet by mouth daily. 90 tablet 3   fenofibrate (TRICOR) 145 MG tablet Take 1 tablet (145 mg total) by mouth daily. 90 tablet 3   isosorbide mononitrate (IMDUR) 120 MG 24 hr tablet Take 1 tablet (120 mg total) by mouth daily. 30 tablet 3   metoprolol succinate (TOPROL-XL) 50 MG 24 hr tablet TAKE 1 TABLET BY MOUTH EVERY DAY WITH OR IMMEDIATELY FOLLOWING A MEAL. 90  tablet 3   nitroGLYCERIN (NITROSTAT) 0.4 MG SL tablet Place 1 tablet (0.4 mg total) under the tongue every 5 (five) minutes as needed for chest pain. 25 tablet 5   pantoprazole (PROTONIX) 40 MG tablet Take 1 tablet (40 mg total) by mouth daily. 30 tablet 1   ranolazine (RANEXA) 1000 MG SR tablet Take 1 tablet (1,000 mg total) by mouth 2 (two) times daily. 180 tablet 3   rosuvastatin (CRESTOR) 10 MG tablet Take 1 tablet (10 mg total) by mouth daily. 90 tablet 3   tamsulosin (FLOMAX) 0.4 MG CAPS capsule Take 1 capsule (0.4 mg total) by mouth daily after supper.     telmisartan (MICARDIS) 80 MG tablet Take 1 tablet (80 mg total) by mouth daily. 90 tablet 1   No current facility-administered medications for this visit.    No Known Allergies  Review of Systems  Constitutional:  Positive for fatigue.  HENT:  Positive for dental problem.        Needs to have a tooth extracted  Eyes: Negative.   Respiratory:  Negative for shortness of breath.   Cardiovascular:  Positive for chest pain. Negative for palpitations and leg swelling.       But none since discharge.  Gastrointestinal: Negative.   Endocrine: Negative.   Genitourinary: Negative.   Musculoskeletal: Negative.   Allergic/Immunologic: Negative.   Neurological:  Negative for dizziness and syncope.  Hematological: Negative.   Psychiatric/Behavioral: Negative.      There were no vitals taken for this visit. Physical Exam Constitutional:      Appearance: Normal appearance. He is normal weight.  HENT:     Head: Normocephalic and atraumatic.  Eyes:     Extraocular Movements: Extraocular movements intact.     Conjunctiva/sclera: Conjunctivae normal.     Pupils: Pupils are equal, round, and reactive to light.  Cardiovascular:     Rate and Rhythm: Normal rate and regular rhythm.     Heart sounds: Normal heart sounds. No murmur heard. Pulmonary:     Effort: Pulmonary effort is normal.     Breath sounds: Normal breath sounds.   Abdominal:     General: Bowel sounds are normal. There is no distension.     Palpations: Abdomen is soft.     Tenderness: There is no abdominal tenderness.  Musculoskeletal:  General: No swelling.  Skin:    General: Skin is warm and dry.  Neurological:     General: No focal deficit present.     Mental Status: He is alert and oriented to person, place, and time.  Psychiatric:        Mood and Affect: Mood normal.        Behavior: Behavior normal.      Diagnostic Tests:  Physicians  Panel Physicians Referring Physician Case Authorizing Physician  [place]il[place], Peter M, MD (Primary)     Procedures  CORONARY BALLOON ANGIOPLASTY  Coronary Ultrasound/IVUS  LEFT HEART CATH AND CORS/GRAFTS ANGIOGRAPHY   Conclusion      Ost RCA to Dist RCA lesion is 100% stenosed.   Ost Cx lesion is 90% stenosed.   Scoring balloon angioplasty was performed using a BALLN WOLVERINE 3.00X10.   Post intervention, there is a 90% residual stenosis.   Right radial artery and is normal in caliber.   The graft exhibits no disease.   There is no competitive flow   LV end diastolic pressure is mildly elevated.   2 vessel obstructive CAD. Severe in stent restenosis at the ostium of the LCx. Chronic occlusion of the RCA. Continued patency of the stent in the proximal LAD Patent free radial graft to the PDA Mildly elevated LVEDP 18 mm Hg Unsuccessful PCI of the ostium of the LCx. Unable to expand lesion due to the fact that multiple balloons ruptured in the lesion at low inflation pressures. Unsuccessful Shockwave lithotripsy.    Plan: PCI option very limited at this point. Could consider Rotational atherectomy which may possible smooth out whatever is causing balloon rupture. However, rotational atherectomy  is off label for in stent disease and even if lesion could eventually be expanded restenosis rate will be higher. The other option to consider would be CABG.    Indications  Unstable angina (HCC)  [I20.0 (ICD-10-CM)]   Procedural Details  Technical Details Indication: 72 yo WM with history of multiple PCIs in the past - mostly in the RCA with recurrent restenosis and subsequent CABG x 1 with radial artery to the RCA. Remote stent of the proximal LAD. S/p PCI with stenting of the ostial LCx in November 2023. Presents now with USAP  Procedural Details:  The left wrist was prepped, draped, and anesthetized with 1% lidocaine. Using the modified Seldinger technique, a 6 French slender sheath was introduced into the left radial artery. 3 mg of verapamil was administered through the sheath, weight-based unfractionated heparin was administered intravenously. Standard Judkins catheters were used for selective coronary angiography and left ventricular pressures. Catheter exchanges were performed over an exchange length guidewire.  PCI Note:  Following the diagnostic procedure, the decision was made to proceed with PCI.  Weight-based heparin was given for anticoagulation. Once a therapeutic ACT was achieved, a 6 [place] EBU 3.5 guide catheter was inserted.  A prowater coronary guidewire was used to cross the lesion. With crossing with the wire the patient began having severe chest pain.   The lesion was predilated with a 2.0 mm balloon. He was given IC Ntg and chest pain improved. We then performed IVUS of the LCx. This demonstrated excellent size and expansion of the LCx in the distal half of the stent. Proximally there was severe narrowing. The ostium did appear to be adequately covered.  We then attempted to dilate the lesion with a 3.0 mm [place] balloon but it ruptured at 6 atm. We also attempted 3.25 x 8, 3.25  x 12 and 2.5 x 12 mm [place] balloons but they all ruptured at low inflation pressures < 6 atm. Finally we attempted to treat with IC lithotripsy with a 3.0 x 12 mm Shockwave balloon but this balloon ruptured at 2 atm. We did deliver 4 pulse sequence but the balloon was never fully inflated. At this  point we had exhausted treatment options. There was persistent severe 90% stenosis at the LCx ostium. The patient did have severe chest pain with each balloon inflation but was pain free at the end of the case. There were no immediate procedural complications. A TR band was used for radial hemostasis. The patient was transferred to the post catheterization recovery area for further monitoring. Contrast: 130 cc     Estimated blood loss <50 mL.   During this procedure medications were administered to achieve and maintain moderate conscious sedation while the patient's heart rate, blood pressure, and oxygen saturation were continuously monitored and I was present face-to-face 100% of this time.   Medications (Filter: Administrations occurring from 1404 to 1616 on 08/17/23)  important  Continuous medications are totaled by the amount administered until 08/17/23 1616.   Heparin (Porcine) in NaCl 1000-0.9 UT/500ML-% SOLN (mL)  Total volume: 1,500 mL Date/Time Rate/Dose/Volume Action   08/17/23 1410 500 mL Given   1410 500 mL Given   1530 500 mL Given   midazolam (VERSED) injection (mg)  Total dose: 3 mg Date/Time Rate/Dose/Volume Action   08/17/23 1418 2 mg Given   1504 1 mg Given   fentaNYL (SUBLIMAZE) injection (mcg)  Total dose: 150 mcg Date/Time Rate/Dose/Volume Action   08/17/23 1418 25 mcg Given   1447 50 mcg Given   1504 25 mcg Given   1538 50 mcg Given   0.9 %  sodium chloride infusion (mL/hr)  Total dose: Cannot be calculated* *Continuous medication not stopped within the calculation time range. Date/Time Rate/Dose/Volume Action   08/17/23 1418 50 mL/hr New Bag/Given   lidocaine (PF) (XYLOCAINE) 1 % injection (mL)  Total volume: 2 mL Date/Time Rate/Dose/Volume Action   08/17/23 1426 2 mL Given   Radial Cocktail/Verapamil only (mL)  Total volume: 10 mL Date/Time Rate/Dose/Volume Action   08/17/23 1429 10 mL Given   heparin sodium (porcine) injection (Units)  Total dose:  13,000 Units Date/Time Rate/Dose/Volume Action   08/17/23 1432 4,000 Units Given   1438 4,000 Units Given   1448 3,000 Units Given   1501 2,000 Units Given   nitroGLYCERIN 1 mg/10 mL (100 mcg/mL) - IR/CATH LAB (mcg)  Total dose: 400 mcg Date/Time Rate/Dose/Volume Action   08/17/23 1446 200 mcg Given   1538 200 mcg Given   hydrALAZINE (APRESOLINE) injection (mg)  Total dose: 10 mg Date/Time Rate/Dose/Volume Action   08/17/23 1551 10 mg Given   iohexol (OMNIPAQUE) 350 MG/ML injection (mL)  Total volume: 130 mL Date/Time Rate/Dose/Volume Action   08/17/23 1551 130 mL Given   acetaminophen (TYLENOL) tablet 650 mg (mg)  Total dose: Cannot be calculated* Dosing weight: 80.7 *Administration dose not documented Date/Time Rate/Dose/Volume Action   08/17/23 1404 *Not included in total MAR Hold   aspirin EC tablet 81 mg (mg)  Total dose: Cannot be calculated* Dosing weight: 80.7 *Administration dose not documented Date/Time Rate/Dose/Volume Action   08/17/23 1404 *Not included in total MAR Hold   clopidogrel (PLAVIX) tablet 75 mg (mg)  Total dose: Cannot be calculated* Dosing weight: 80.7 *Administration dose not documented Date/Time Rate/Dose/Volume Action   08/17/23 1404 *Not included in total [facility]  Hold   ezetimibe (ZETIA) tablet 10 mg (mg)  Total dose: Cannot be calculated* Dosing weight: 80.7 *Administration dose not documented Date/Time Rate/Dose/Volume Action   08/17/23 1404 *Not included in total MAR Hold   fenofibrate tablet 160 mg (mg)  Total dose: Cannot be calculated* Dosing weight: 80.7 *Administration dose not documented Date/Time Rate/Dose/Volume Action   08/17/23 1404 *Not included in total MAR Hold   heparin injection 5,000 Units (Units)  Total dose: Cannot be calculated* Dosing weight: 80.7 *Administration dose not documented Date/Time Rate/Dose/Volume Action   08/17/23 1404 *Not included in total MAR Hold   irbesartan (AVAPRO) tablet 300 mg (mg)  Total dose:  Cannot be calculated* Dosing weight: 80.7 *Administration dose not documented Date/Time Rate/Dose/Volume Action   08/17/23 1404 *Not included in total MAR Hold   metoprolol succinate (TOPROL-XL) 24 hr tablet 50 mg (mg)  Total dose: Cannot be calculated* *Administration dose not documented Date/Time Rate/Dose/Volume Action   08/17/23 1404 *Not included in total MAR Hold   nitroGLYCERIN 50 mg in dextrose 5 % 250 mL (0.2 mg/mL) infusion (mcg/min)  Total dose: Cannot be calculated* Dosing weight: 80.5 *Administration dose not documented Date/Time Rate/Dose/Volume Action   08/17/23 1404 *Not included in total MAR Hold   ondansetron (ZOFRAN) injection 4 mg (mg)  Total dose: Cannot be calculated* Dosing weight: 80.7 *Administration dose not documented Date/Time Rate/Dose/Volume Action   08/17/23 1404 *Not included in total MAR Hold   pantoprazole (PROTONIX) EC tablet 40 mg (mg)  Total dose: Cannot be calculated* Dosing weight: 80.7 *Administration dose not documented Date/Time Rate/Dose/Volume Action   08/17/23 1404 *Not included in total MAR Hold   rosuvastatin (CRESTOR) tablet 10 mg (mg)  Total dose: Cannot be calculated* *Administration dose not documented Date/Time Rate/Dose/Volume Action   08/17/23 1404 *Not included in total MAR Hold   tamsulosin (FLOMAX) capsule 0.4 mg (mg)  Total dose: Cannot be calculated* *Administration dose not documented Date/Time Rate/Dose/Volume Action   08/17/23 1404 *Not included in total MAR Hold    Sedation Time  Sedation Time Physician-1: 1 hour 24 minutes 19 seconds Contrast     Administrations occurring from 1404 to 1616 on 08/17/23:  Medication Name Total Dose  iohexol (OMNIPAQUE) 350 MG/ML injection 130 mL   Radiation/Fluoro  Fluoro time: 11.1 (min) DAP: 25186 (mGycm2) Cumulative Air Kerma: 453 (mGy) Complications  Complications documented before study signed (08/17/2023  4:16 PM)   No complications were associated with this  study.  Documented by Lucius Conn, RT - 08/17/2023  3:44 PM     Coronary Findings  Diagnostic Dominance: Right Left Main  Vessel is normal in caliber.    Left Anterior Descending  The vessel exhibits minimal luminal irregularities.    First Diagonal Branch  Vessel is small in size.    Second Diagonal Branch  Vessel is small in size.    Third Diagonal Branch  Vessel is small in size.    Ramus Intermedius  Vessel is small.    Left Circumflex  Vessel is moderate in size. There is mild diffuse disease throughout the vessel.  Ost Cx lesion is 90% stenosed. Vessel is the culprit lesion. The lesion is located at the bifurcation and eccentric. Fibrotic The lesion was previously treated using a drug eluting stent between 6-12 months ago.    Second Obtuse Marginal Branch  Vessel is small in size.    Left Posterior Atrioventricular Artery  Vessel is small in size.    Right Coronary Artery  Vessel is normal in  caliber. There is severe diffuse disease throughout the vessel.  Ost RCA to Dist RCA lesion is 100% stenosed. The lesion was previously treated using a bare metal stent and a drug eluting stent over 2 years ago.    Right Posterior Descending Artery  Vessel is small in size.    Right Posterior Atrioventricular Artery  Vessel is small in size.    First Right Posterolateral Branch  Vessel is small in size.    Second Right Posterolateral Branch  Vessel is small in size.    Right Radial Artery Graft To RPDA  Right radial artery and is normal in caliber. The graft exhibits no disease. There is no competitive flow    Intervention   Ost Cx lesion  Angioplasty  CATH LAUNCHER 6FR EBU3.5 guide catheter was inserted. WIRE ASAHI PROWATER 180CM guidewire used to cross lesion. Scoring balloon angioplasty was performed using a BALLN WOLVERINE 3.00X10.  Post-Intervention Lesion Assessment  The intervention was unsuccessful due to lesion rigidity. Pre-interventional TIMI  flow is 3. Post-intervention TIMI flow is 3. No complications occurred at this lesion.  There is a 90% residual stenosis post intervention.     Left Heart  Left Ventricle LV end diastolic pressure is mildly elevated.   Coronary Diagrams  Diagnostic Dominance: Right  Intervention    Implants   No implant documentation for this case.   Syngo Images   Show images for CARDIAC CATHETERIZATION Images on Long Term Storage   Show images for Hondo, V[place]uvel to Procedure Log  Procedure Log   Link to Procedure Log  Procedure Log    Hemo Data  Flowsheet Row Most Recent Value  AO Systolic Pressure 186 mmHg  AO Diastolic Pressure 74 mmHg  AO Mean 122 mmHg  LV Systolic Pressure 180 mmHg  LV Diastolic Pressure 2 mmHg  LV EDP 13 mmHg  AOp Systolic Pressure 184 mmHg  AOp Diastolic Pressure 67 mmHg  AOp Mean Pressure 116 mmHg  LVp Systolic Pressure 181 mmHg  LVp Diastolic Pressure -3 mmHg  LVp EDP Pressure 9 mmHg    Impression:  This 72 year old gentleman has a high-grade in-stent restenosis within the proximal left circumflex that could not be opened with PCI.  The LAD has no significant stenosis.  The RCA is chronically occluded with a patent radial artery graft to the posterior descending branch.  I think the best option is redo coronary bypass graft surgery to the left circumflex system using a left internal mammary graft if it is suitable and a vein graft if it is not.  I reviewed the cardiac catheterization images with the patient and his wife and answered their questions.I discussed the operative procedure with the patient and his wife including alternatives, benefits and risks; including but not limited to bleeding, blood transfusion, infection, stroke, myocardial infarction, graft failure, heart block requiring a permanent pacemaker, organ dysfunction, and death.  Franne Grip understands and agrees to proceed.     Plan:  He will be scheduled for redo coronary  bypass graft surgery on Monday, 09/03/2023.  He has been instructed to discontinue his Plavix after the dose on 08/28/2023.  He will notify our office if he has any recurrent angina.  I instructed him not to do any yard work or work around his house or other exertional activity that may bring on chest discomfort.  I spent 45 minutes performing this consultation and > 50% of this time was spent face to face counseling and coordinating the care of  this patient's high-grade left circumflex ostial in-stent restenosis.   [name], MD [facility] [phone]

## 2023-08-22 ENCOUNTER — Encounter: Payer: Self-pay | Admitting: *Deleted

## 2023-08-30 NOTE — Pre-Procedure Instructions (Signed)
Surgical Instructions   Your procedure is scheduled on September 03, 2023. Report to [facility] Main Entrance "A" at 5:30 A.M., then check in with the Admitting office. Any questions or running late day of surgery: call [phone]  Questions prior to your surgery date: call [phone], Monday-Friday, 8am-4pm. If you experience any cold or flu symptoms such as cough, fever, chills, shortness of breath, etc. between now and your scheduled surgery, please notify us at the above number.     Remember:  Do not eat or drink after midnight the night before your surgery    Take these medicines the morning of surgery with A SIP OF WATER: ezetimibe (ZETIA)  fenofibrate (TRICOR)  isosorbide mononitrate (IMDUR)  metoprolol succinate (TOPROL-XL)  pantoprazole (PROTONIX)  ranolazine (RANEXA)  rosuvastatin (CRESTOR)    May take these medicines IF NEEDED: nitroGLYCERIN (NITROSTAT) - please call [phone] if dose taken prior to surgery.   You may continue taking your Aspirin until the day before surgery. DO NOT take any the morning of surgery.   STOP taking clopidogrel (PLAVIX) five days prior to surgery. Your last dose will be September 3rd.   One week prior to surgery, STOP taking any Aleve, Naproxen, Ibuprofen, Motrin, Advil, Goody's, BC's, all herbal medications, fish oil, and non-prescription vitamins.                     Do NOT Smoke (Tobacco/Vaping) for 24 hours prior to your procedure.  If you use a CPAP at night, you may bring your mask/headgear for your overnight stay.   You will be asked to remove any contacts, glasses, piercing's, hearing aid's, dentures/partials prior to surgery. Please bring cases for these items if needed.    Patients discharged the day of surgery will not be allowed to drive home, and someone needs to stay with them for 24 hours.  SURGICAL WAITING ROOM VISITATION Patients may have no more than 2 support people in the waiting area - these visitors  may rotate.   Pre-op nurse will coordinate an appropriate time for 1 ADULT support person, who may not rotate, to accompany patient in pre-op.  Children under the age of 4 must have an adult with them who is not the patient and must remain in the main waiting area with an adult.  If the patient needs to stay at the hospital during part of their recovery, the visitor guidelines for inpatient rooms apply.  Please refer to the [facility] website for the visitor guidelines for any additional information.   If you received a COVID test during your pre-op visit  it is requested that you wear a mask when out in public, stay away from anyone that may not be feeling well and notify your surgeon if you develop symptoms. If you have been in contact with anyone that has tested positive in the last 10 days please notify you surgeon.      Pre-operative CHG Bathing Instructions   You can play a key role in reducing the risk of infection after surgery. Your skin needs to be as free of germs as possible. You can reduce the number of germs on your skin by washing with CHG (chlorhexidine gluconate) soap before surgery. CHG is an antiseptic soap that kills germs and continues to kill germs even after washing.   DO NOT use if you have an allergy to chlorhexidine/CHG or antibacterial soaps. If your skin becomes reddened or irritated, stop using the CHG and notify one of our  RNs at [phone].              TAKE A SHOWER THE NIGHT BEFORE SURGERY AND THE DAY OF SURGERY    Please keep in mind the following:  DO NOT shave, including legs and underarms, 48 hours prior to surgery.   You may shave your face before/day of surgery.  Place clean sheets on your bed the night before surgery Use a clean washcloth (not used since being washed) for each shower. DO NOT sleep with pet's night before surgery.  CHG Shower Instructions:  If you choose to wash your hair and private area, wash first with your normal  shampoo/soap.  After you use shampoo/soap, rinse your hair and body thoroughly to remove shampoo/soap residue.  Turn the water OFF and apply half the bottle of CHG soap to a CLEAN washcloth.  Apply CHG soap ONLY FROM YOUR NECK DOWN TO YOUR TOES (washing for 3-5 minutes)  DO NOT use CHG soap on face, private areas, open wounds, or sores.  Pay special attention to the area where your surgery is being performed.  If you are having back surgery, having someone wash your back for you may be helpful. Wait 2 minutes after CHG soap is applied, then you may rinse off the CHG soap.  Pat dry with a clean towel  Put on clean pajamas    Additional instructions for the day of surgery: DO NOT APPLY any lotions, deodorants, cologne, or perfumes.   Do not wear jewelry or makeup Do not wear nail polish, gel polish, artificial nails, or any other type of covering on natural nails (fingers and toes) Do not bring valuables to the hospital. [facility] is not responsible for valuables/personal belongings. Put on clean/comfortable clothes.  Please brush your teeth.  Ask your nurse before applying any prescription medications to the skin.

## 2023-08-31 ENCOUNTER — Ambulatory Visit (HOSPITAL_BASED_OUTPATIENT_CLINIC_OR_DEPARTMENT_OTHER)
Admission: RE | Admit: 2023-08-31 | Discharge: 2023-08-31 | Disposition: A | Discharge status: Home / Self Care | Payer: PPO | Source: Ambulatory Visit | Attending: Surgery | Admitting: Surgery

## 2023-08-31 ENCOUNTER — Encounter (HOSPITAL_COMMUNITY)
Admission: RE | Admit: 2023-08-31 | Discharge: 2023-08-31 | Disposition: A | Discharge status: Home / Self Care | Payer: PPO | Source: Ambulatory Visit | Attending: Surgery | Admitting: Surgery

## 2023-08-31 ENCOUNTER — Other Ambulatory Visit: Payer: Self-pay

## 2023-08-31 ENCOUNTER — Encounter (HOSPITAL_COMMUNITY): Payer: Self-pay

## 2023-08-31 ENCOUNTER — Ambulatory Visit (HOSPITAL_COMMUNITY)
Admission: RE | Admit: 2023-08-31 | Discharge: 2023-08-31 | Disposition: A | Discharge status: Home / Self Care | Payer: PPO | Source: Ambulatory Visit | Attending: Surgery | Admitting: Surgery

## 2023-08-31 VITALS — BP 149/57 | HR 59 | Temp 98.2°F | Resp 18 | Ht 68.0 in | Wt 177.3 lb

## 2023-08-31 DIAGNOSIS — Z1152 Encounter for screening for COVID-19: Secondary | ICD-10-CM | POA: Insufficient documentation

## 2023-08-31 DIAGNOSIS — Z7902 Long term (current) use of antithrombotics/antiplatelets: Secondary | ICD-10-CM | POA: Insufficient documentation

## 2023-08-31 DIAGNOSIS — Z79899 Other long term (current) drug therapy: Secondary | ICD-10-CM | POA: Insufficient documentation

## 2023-08-31 DIAGNOSIS — I25119 Atherosclerotic heart disease of native coronary artery with unspecified angina pectoris: Secondary | ICD-10-CM | POA: Insufficient documentation

## 2023-08-31 DIAGNOSIS — Z01818 Encounter for other preprocedural examination: Secondary | ICD-10-CM | POA: Diagnosis not present

## 2023-08-31 DIAGNOSIS — Z87891 Personal history of nicotine dependence: Secondary | ICD-10-CM | POA: Insufficient documentation

## 2023-08-31 DIAGNOSIS — I252 Old myocardial infarction: Secondary | ICD-10-CM | POA: Insufficient documentation

## 2023-08-31 DIAGNOSIS — Z951 Presence of aortocoronary bypass graft: Secondary | ICD-10-CM | POA: Insufficient documentation

## 2023-08-31 DIAGNOSIS — I349 Nonrheumatic mitral valve disorder, unspecified: Secondary | ICD-10-CM | POA: Insufficient documentation

## 2023-08-31 DIAGNOSIS — Z955 Presence of coronary angioplasty implant and graft: Secondary | ICD-10-CM | POA: Diagnosis not present

## 2023-08-31 HISTORY — DX: Myoneural disorder, unspecified: G70.9

## 2023-08-31 LAB — ECHOCARDIOGRAM COMPLETE
AR max vel: 2.6 cm2
AV Area VTI: 2.83 cm2
AV Area mean vel: 2.68 cm2
AV Mean grad: 2 mmHg
AV Peak grad: 4.3 mmHg
Ao pk vel: 1.04 m/s
Area-P 1/2: 2.9 cm2
Height: 68 in
S' Lateral: 3.3 cm
Weight: 2836.8 [oz_av]

## 2023-08-31 LAB — BLOOD GAS, ARTERIAL
Acid-base deficit: 0.3 mmol/L (ref 0.0–2.0)
Bicarbonate: 24.1 mmol/L (ref 20.0–28.0)
Drawn by: 58793
O2 Saturation: 99.7 %
Patient temperature: 37
pCO2 arterial: 38 mmHg (ref 32–48)
pH, Arterial: 7.41 (ref 7.35–7.45)
pO2, Arterial: 95 mmHg (ref 83–108)

## 2023-08-31 LAB — URINALYSIS, ROUTINE W REFLEX MICROSCOPIC
Bacteria, UA: NONE SEEN
Bilirubin Urine: NEGATIVE
Glucose, UA: NEGATIVE mg/dL
Hgb urine dipstick: NEGATIVE
Ketones, ur: NEGATIVE mg/dL
Leukocytes,Ua: NEGATIVE
Nitrite: NEGATIVE
Protein, ur: NEGATIVE mg/dL
Specific Gravity, Urine: 1.005 (ref 1.005–1.030)
pH: 6 (ref 5.0–8.0)

## 2023-08-31 LAB — COMPREHENSIVE METABOLIC PANEL
ALT: 24 U/L (ref 0–44)
AST: 20 U/L (ref 15–41)
Albumin: 3.9 g/dL (ref 3.5–5.0)
Alkaline Phosphatase: 39 U/L (ref 38–126)
Anion gap: 15 (ref 5–15)
BUN: 18 mg/dL (ref 8–23)
CO2: 20 mmol/L — ABNORMAL LOW (ref 22–32)
Calcium: 9.3 mg/dL (ref 8.9–10.3)
Chloride: 99 mmol/L (ref 98–111)
Creatinine, Ser: 1.29 mg/dL — ABNORMAL HIGH (ref 0.61–1.24)
GFR, Estimated: 59 mL/min — ABNORMAL LOW (ref >60)
Glucose, Bld: 101 mg/dL — ABNORMAL HIGH (ref 70–99)
Potassium: 4.7 mmol/L (ref 3.5–5.1)
Sodium: 134 mmol/L — ABNORMAL LOW (ref 135–145)
Total Bilirubin: 0.7 mg/dL (ref 0.3–1.2)
Total Protein: 7.1 g/dL (ref 6.5–8.1)

## 2023-08-31 LAB — CBC
HCT: 35.1 % — ABNORMAL LOW (ref 39.0–52.0)
Hemoglobin: 11.9 g/dL — ABNORMAL LOW (ref 13.0–17.0)
MCH: 29.2 pg (ref 26.0–34.0)
MCHC: 33.9 g/dL (ref 30.0–36.0)
MCV: 86 fL (ref 80.0–100.0)
Platelets: 155 10*3/uL (ref 150–400)
RBC: 4.08 MIL/uL — ABNORMAL LOW (ref 4.22–5.81)
RDW: 13.3 % (ref 11.5–15.5)
WBC: 6.3 10*3/uL (ref 4.0–10.5)
nRBC: 0 % (ref 0.0–0.2)

## 2023-08-31 LAB — PROTIME-INR
INR: 1 (ref 0.8–1.2)
Prothrombin Time: 13.2 s (ref 11.4–15.2)

## 2023-08-31 LAB — HEMOGLOBIN A1C
Hgb A1c MFr Bld: 5.8 % — ABNORMAL HIGH (ref 4.8–5.6)
Mean Plasma Glucose: 119.76 mg/dL

## 2023-08-31 LAB — SURGICAL PCR SCREEN
MRSA, PCR: NEGATIVE
Staphylococcus aureus: NEGATIVE

## 2023-08-31 LAB — APTT: aPTT: 27 s (ref 24–36)

## 2023-08-31 MED ORDER — TRANEXAMIC ACID 1000 MG/10ML IV SOLN
1.5000 mg/kg/h | INTRAVENOUS | Status: AC
  Administered 2023-09-03: 1.5 mg/kg/h via INTRAVENOUS
  Filled 2023-08-31: qty 25

## 2023-08-31 MED ORDER — EPINEPHRINE HCL 5 MG/250ML IV SOLN IN NS
0.0000 ug/min | INTRAVENOUS | Status: AC
  Administered 2023-09-03: 2 ug/min via INTRAVENOUS
  Filled 2023-08-31: qty 250

## 2023-08-31 MED ORDER — PHENYLEPHRINE HCL-NACL 20-0.9 MG/250ML-% IV SOLN
30.0000 ug/min | INTRAVENOUS | Status: AC
  Administered 2023-09-03: 20 ug/min via INTRAVENOUS
  Filled 2023-08-31: qty 250

## 2023-08-31 MED ORDER — MILRINONE LACTATE IN DEXTROSE 20-5 MG/100ML-% IV SOLN
0.3000 ug/kg/min | INTRAVENOUS | Status: DC
  Filled 2023-08-31: qty 100

## 2023-08-31 MED ORDER — HEPARIN 30,000 UNITS/1000 ML (OHS) CELLSAVER SOLUTION
Status: DC
  Filled 2023-08-31: qty 1000

## 2023-08-31 MED ORDER — PLASMA-LYTE A IV SOLN
INTRAVENOUS | Status: DC
  Filled 2023-08-31: qty 2.5

## 2023-08-31 MED ORDER — NITROGLYCERIN IN D5W 200-5 MCG/ML-% IV SOLN
2.0000 ug/min | INTRAVENOUS | Status: DC
  Filled 2023-08-31: qty 250

## 2023-08-31 MED ORDER — INSULIN REGULAR(HUMAN) IN NACL 100-0.9 UT/100ML-% IV SOLN
INTRAVENOUS | Status: AC
  Administered 2023-09-03: .8 [IU]/h via INTRAVENOUS
  Filled 2023-08-31: qty 100

## 2023-08-31 MED ORDER — SODIUM CHLORIDE 0.9% IV SOLUTION: Freq: Once | INTRAVENOUS | Status: DC

## 2023-08-31 MED ORDER — CEFAZOLIN SODIUM-DEXTROSE 2-4 GM/100ML-% IV SOLN
2.0000 g | INTRAVENOUS | Status: AC
  Administered 2023-09-03: 2 g via INTRAVENOUS
  Filled 2023-08-31: qty 100

## 2023-08-31 MED ORDER — VANCOMYCIN HCL 1250 MG/250ML IV SOLN
1250.0000 mg | INTRAVENOUS | Status: AC
  Administered 2023-09-03: 1250 mg via INTRAVENOUS
  Filled 2023-08-31: qty 250

## 2023-08-31 MED ORDER — POTASSIUM CHLORIDE 2 MEQ/ML IV SOLN
80.0000 meq | INTRAVENOUS | Status: DC
  Filled 2023-08-31: qty 40

## 2023-08-31 MED ORDER — MAGNESIUM SULFATE 50 % IJ SOLN
40.0000 meq | INTRAMUSCULAR | Status: DC
  Filled 2023-08-31: qty 9.85

## 2023-08-31 MED ORDER — TRANEXAMIC ACID (OHS) PUMP PRIME SOLUTION
2.0000 mg/kg | INTRAVENOUS | Status: DC
  Filled 2023-08-31: qty 1.61

## 2023-08-31 MED ORDER — DEXMEDETOMIDINE HCL IN NACL 400 MCG/100ML IV SOLN
0.1000 ug/kg/h | INTRAVENOUS | Status: AC
  Administered 2023-09-03: .7 ug/kg/h via INTRAVENOUS
  Filled 2023-08-31: qty 100

## 2023-08-31 MED ORDER — TRANEXAMIC ACID (OHS) BOLUS VIA INFUSION
15.0000 mg/kg | INTRAVENOUS | Status: AC
  Administered 2023-09-03: 1206 mg via INTRAVENOUS
  Filled 2023-08-31: qty 1206

## 2023-08-31 MED ORDER — NOREPINEPHRINE 4 MG/250ML-% IV SOLN
0.0000 ug/min | INTRAVENOUS | Status: AC
  Administered 2023-09-03: 4 ug/min via INTRAVENOUS
  Filled 2023-08-31: qty 250

## 2023-08-31 NOTE — Progress Notes (Signed)
PCP - Dr. Assunta Found Cardiologist - Dr. Zoila Shutter  PPM/ICD - Denies Device Orders - n/a Rep Notified - n/a  Chest x-ray - 08/31/2023 EKG - 08/18/2023 Stress Test - 08/30/2022 ECHO - 08/19/2013 Cardiac Cath - 08/17/2023  Sleep Study - Denies CPAP - n/a  No DM  Last dose of GLP1 agonist- n/a GLP1 instructions: n/a  Blood Thinner Instructions: Per surgeon instructions, pts last dose of Plavix was 9/3 Aspirin Instructions: Pt instructed to continue to take ASA through the day before surgery. He will not take any the morning of surgery  NPO after midnight  COVID TEST- Yes. Result pending.   Anesthesia review: Yes. Redo sternotomy. Hx CABG in 2009 by Dr. Laneta Simmers  Patient denies shortness of breath, fever, cough and chest pain at PAT appointment. Pt denies any respiratory illness/infection in the last two months.   All instructions explained to the patient, with a verbal understanding of the material. Patient agrees to go over the instructions while at home for a better understanding. Patient also instructed to self quarantine after being tested for COVID-19. The opportunity to ask questions was provided.

## 2023-09-01 LAB — SARS CORONAVIRUS 2 (TAT 6-24 HRS): SARS Coronavirus 2: NEGATIVE

## 2023-09-03 ENCOUNTER — Inpatient Hospital Stay (HOSPITAL_COMMUNITY): Payer: PPO

## 2023-09-03 ENCOUNTER — Inpatient Hospital Stay (HOSPITAL_COMMUNITY)
Admission: RE | Admit: 2023-09-03 | Discharge: 2023-09-08 | DRG: 236 | Disposition: A | Discharge status: Home / Self Care | Payer: PPO | Attending: Surgery | Admitting: Surgery

## 2023-09-03 ENCOUNTER — Inpatient Hospital Stay (HOSPITAL_COMMUNITY)
Admission: RE | Disposition: A | Discharge status: Home / Self Care | Payer: Self-pay | Source: Home / Self Care | Attending: Surgery

## 2023-09-03 ENCOUNTER — Other Ambulatory Visit: Payer: Self-pay

## 2023-09-03 ENCOUNTER — Encounter (HOSPITAL_COMMUNITY): Payer: Self-pay | Admitting: Surgery

## 2023-09-03 ENCOUNTER — Ambulatory Visit: Payer: PPO | Admitting: Nurse Practitioner

## 2023-09-03 ENCOUNTER — Telehealth: Payer: Self-pay | Admitting: *Deleted

## 2023-09-03 DIAGNOSIS — I200 Unstable angina: Secondary | ICD-10-CM | POA: Diagnosis present

## 2023-09-03 DIAGNOSIS — Z7902 Long term (current) use of antithrombotics/antiplatelets: Secondary | ICD-10-CM | POA: Diagnosis not present

## 2023-09-03 DIAGNOSIS — Z951 Presence of aortocoronary bypass graft: Principal | ICD-10-CM

## 2023-09-03 DIAGNOSIS — K219 Gastro-esophageal reflux disease without esophagitis: Secondary | ICD-10-CM | POA: Diagnosis present

## 2023-09-03 DIAGNOSIS — I480 Paroxysmal atrial fibrillation: Secondary | ICD-10-CM | POA: Diagnosis present

## 2023-09-03 DIAGNOSIS — T82855A Stenosis of coronary artery stent, initial encounter: Secondary | ICD-10-CM | POA: Diagnosis not present

## 2023-09-03 DIAGNOSIS — J9811 Atelectasis: Secondary | ICD-10-CM | POA: Diagnosis not present

## 2023-09-03 DIAGNOSIS — E785 Hyperlipidemia, unspecified: Secondary | ICD-10-CM

## 2023-09-03 DIAGNOSIS — Z87891 Personal history of nicotine dependence: Secondary | ICD-10-CM | POA: Diagnosis not present

## 2023-09-03 DIAGNOSIS — J90 Pleural effusion, not elsewhere classified: Secondary | ICD-10-CM | POA: Diagnosis not present

## 2023-09-03 DIAGNOSIS — I272 Pulmonary hypertension, unspecified: Secondary | ICD-10-CM | POA: Diagnosis present

## 2023-09-03 DIAGNOSIS — Z8249 Family history of ischemic heart disease and other diseases of the circulatory system: Secondary | ICD-10-CM

## 2023-09-03 DIAGNOSIS — K3189 Other diseases of stomach and duodenum: Secondary | ICD-10-CM | POA: Diagnosis not present

## 2023-09-03 DIAGNOSIS — R000 Tachycardia, unspecified: Secondary | ICD-10-CM | POA: Diagnosis not present

## 2023-09-03 DIAGNOSIS — I959 Hypotension, unspecified: Secondary | ICD-10-CM | POA: Diagnosis present

## 2023-09-03 DIAGNOSIS — I2510 Atherosclerotic heart disease of native coronary artery without angina pectoris: Secondary | ICD-10-CM | POA: Diagnosis not present

## 2023-09-03 DIAGNOSIS — Z1152 Encounter for screening for COVID-19: Secondary | ICD-10-CM

## 2023-09-03 DIAGNOSIS — Y831 Surgical operation with implant of artificial internal device as the cause of abnormal reaction of the patient, or of later complication, without mention of misadventure at the time of the procedure: Secondary | ICD-10-CM | POA: Diagnosis present

## 2023-09-03 DIAGNOSIS — I4892 Unspecified atrial flutter: Secondary | ICD-10-CM | POA: Diagnosis present

## 2023-09-03 DIAGNOSIS — I25110 Atherosclerotic heart disease of native coronary artery with unstable angina pectoris: Secondary | ICD-10-CM | POA: Diagnosis not present

## 2023-09-03 DIAGNOSIS — N400 Enlarged prostate without lower urinary tract symptoms: Secondary | ICD-10-CM | POA: Diagnosis not present

## 2023-09-03 DIAGNOSIS — E8770 Fluid overload, unspecified: Secondary | ICD-10-CM | POA: Diagnosis not present

## 2023-09-03 DIAGNOSIS — D6959 Other secondary thrombocytopenia: Secondary | ICD-10-CM | POA: Diagnosis not present

## 2023-09-03 DIAGNOSIS — Z7982 Long term (current) use of aspirin: Secondary | ICD-10-CM

## 2023-09-03 DIAGNOSIS — I10 Essential (primary) hypertension: Secondary | ICD-10-CM | POA: Diagnosis present

## 2023-09-03 DIAGNOSIS — Z48812 Encounter for surgical aftercare following surgery on the circulatory system: Secondary | ICD-10-CM | POA: Diagnosis not present

## 2023-09-03 DIAGNOSIS — R918 Other nonspecific abnormal finding of lung field: Secondary | ICD-10-CM | POA: Diagnosis not present

## 2023-09-03 DIAGNOSIS — I252 Old myocardial infarction: Secondary | ICD-10-CM | POA: Diagnosis not present

## 2023-09-03 DIAGNOSIS — Z79899 Other long term (current) drug therapy: Secondary | ICD-10-CM | POA: Diagnosis not present

## 2023-09-03 DIAGNOSIS — R531 Weakness: Secondary | ICD-10-CM | POA: Diagnosis not present

## 2023-09-03 DIAGNOSIS — I25119 Atherosclerotic heart disease of native coronary artery with unspecified angina pectoris: Secondary | ICD-10-CM

## 2023-09-03 DIAGNOSIS — R0989 Other specified symptoms and signs involving the circulatory and respiratory systems: Secondary | ICD-10-CM | POA: Diagnosis not present

## 2023-09-03 DIAGNOSIS — D3610 Benign neoplasm of peripheral nerves and autonomic nervous system, unspecified: Secondary | ICD-10-CM | POA: Diagnosis not present

## 2023-09-03 DIAGNOSIS — I70209 Unspecified atherosclerosis of native arteries of extremities, unspecified extremity: Secondary | ICD-10-CM | POA: Diagnosis not present

## 2023-09-03 DIAGNOSIS — E782 Mixed hyperlipidemia: Secondary | ICD-10-CM | POA: Diagnosis not present

## 2023-09-03 HISTORY — PX: CORONARY ARTERY BYPASS GRAFT: SHX141

## 2023-09-03 HISTORY — PX: TEE WITHOUT CARDIOVERSION: SHX5443

## 2023-09-03 LAB — PREPARE RBC (CROSSMATCH)

## 2023-09-03 LAB — POCT I-STAT 7, (LYTES, BLD GAS, ICA,H+H)
Acid-base deficit: 2 mmol/L (ref 0.0–2.0)
Acid-base deficit: 5 mmol/L — ABNORMAL HIGH (ref 0.0–2.0)
Acid-base deficit: 4 mmol/L — ABNORMAL HIGH (ref 0.0–2.0)
Acid-base deficit: 5 mmol/L — ABNORMAL HIGH (ref 0.0–2.0)
Acid-base deficit: 6 mmol/L — ABNORMAL HIGH (ref 0.0–2.0)
Acid-base deficit: 2 mmol/L (ref 0.0–2.0)
Acid-base deficit: 3 mmol/L — ABNORMAL HIGH (ref 0.0–2.0)
Acid-base deficit: 3 mmol/L — ABNORMAL HIGH (ref 0.0–2.0)
Acid-base deficit: 4 mmol/L — ABNORMAL HIGH (ref 0.0–2.0)
Acid-base deficit: 5 mmol/L — ABNORMAL HIGH (ref 0.0–2.0)
Bicarbonate: 23.1 mmol/L (ref 20.0–28.0)
Bicarbonate: 23.2 mmol/L (ref 20.0–28.0)
Bicarbonate: 20.8 mmol/L (ref 20.0–28.0)
Bicarbonate: 20.6 mmol/L (ref 20.0–28.0)
Bicarbonate: 23.6 mmol/L (ref 20.0–28.0)
Bicarbonate: 26.4 mmol/L (ref 20.0–28.0)
Bicarbonate: 24.8 mmol/L (ref 20.0–28.0)
Bicarbonate: 23.2 mmol/L (ref 20.0–28.0)
Bicarbonate: 21.9 mmol/L (ref 20.0–28.0)
Bicarbonate: 22.4 mmol/L (ref 20.0–28.0)
Calcium, Ion: 1.06 mmol/L — ABNORMAL LOW (ref 1.15–1.40)
Calcium, Ion: 1.28 mmol/L (ref 1.15–1.40)
Calcium, Ion: 1 mmol/L — ABNORMAL LOW (ref 1.15–1.40)
Calcium, Ion: 1.25 mmol/L (ref 1.15–1.40)
Calcium, Ion: 1.17 mmol/L (ref 1.15–1.40)
Calcium, Ion: 1.12 mmol/L — ABNORMAL LOW (ref 1.15–1.40)
Calcium, Ion: 1.11 mmol/L — ABNORMAL LOW (ref 1.15–1.40)
Calcium, Ion: 1.11 mmol/L — ABNORMAL LOW (ref 1.15–1.40)
Calcium, Ion: 1.12 mmol/L — ABNORMAL LOW (ref 1.15–1.40)
Calcium, Ion: 1.13 mmol/L — ABNORMAL LOW (ref 1.15–1.40)
HCT: 21 % — ABNORMAL LOW (ref 39.0–52.0)
HCT: 29 % — ABNORMAL LOW (ref 39.0–52.0)
HCT: 26 % — ABNORMAL LOW (ref 39.0–52.0)
HCT: 24 % — ABNORMAL LOW (ref 39.0–52.0)
HCT: 36 % — ABNORMAL LOW (ref 39.0–52.0)
HCT: 31 % — ABNORMAL LOW (ref 39.0–52.0)
HCT: 35 % — ABNORMAL LOW (ref 39.0–52.0)
HCT: 30 % — ABNORMAL LOW (ref 39.0–52.0)
HCT: 33 % — ABNORMAL LOW (ref 39.0–52.0)
HCT: 34 % — ABNORMAL LOW (ref 39.0–52.0)
Hemoglobin: 7.1 g/dL — ABNORMAL LOW (ref 13.0–17.0)
Hemoglobin: 9.9 g/dL — ABNORMAL LOW (ref 13.0–17.0)
Hemoglobin: 8.8 g/dL — ABNORMAL LOW (ref 13.0–17.0)
Hemoglobin: 8.2 g/dL — ABNORMAL LOW (ref 13.0–17.0)
Hemoglobin: 12.2 g/dL — ABNORMAL LOW (ref 13.0–17.0)
Hemoglobin: 10.5 g/dL — ABNORMAL LOW (ref 13.0–17.0)
Hemoglobin: 11.9 g/dL — ABNORMAL LOW (ref 13.0–17.0)
Hemoglobin: 10.2 g/dL — ABNORMAL LOW (ref 13.0–17.0)
Hemoglobin: 11.2 g/dL — ABNORMAL LOW (ref 13.0–17.0)
Hemoglobin: 11.6 g/dL — ABNORMAL LOW (ref 13.0–17.0)
O2 Saturation: 100 %
O2 Saturation: 100 %
O2 Saturation: 100 %
O2 Saturation: 99 %
O2 Saturation: 89 %
O2 Saturation: 100 %
O2 Saturation: 99 %
O2 Saturation: 97 %
O2 Saturation: 97 %
O2 Saturation: 93 %
Patient temperature: 35.6
Patient temperature: 35.3
Patient temperature: 36
Patient temperature: 37.3
Patient temperature: 37
Patient temperature: 36.5
Potassium: 4.3 mmol/L (ref 3.5–5.1)
Potassium: 5.7 mmol/L — ABNORMAL HIGH (ref 3.5–5.1)
Potassium: 5.5 mmol/L — ABNORMAL HIGH (ref 3.5–5.1)
Potassium: 4.2 mmol/L (ref 3.5–5.1)
Potassium: 4 mmol/L (ref 3.5–5.1)
Potassium: 4.1 mmol/L (ref 3.5–5.1)
Potassium: 4.6 mmol/L (ref 3.5–5.1)
Potassium: 4.8 mmol/L (ref 3.5–5.1)
Potassium: 4.6 mmol/L (ref 3.5–5.1)
Potassium: 4.4 mmol/L (ref 3.5–5.1)
Sodium: 135 mmol/L (ref 135–145)
Sodium: 137 mmol/L (ref 135–145)
Sodium: 134 mmol/L — ABNORMAL LOW (ref 135–145)
Sodium: 137 mmol/L (ref 135–145)
Sodium: 140 mmol/L (ref 135–145)
Sodium: 141 mmol/L (ref 135–145)
Sodium: 141 mmol/L (ref 135–145)
Sodium: 140 mmol/L (ref 135–145)
Sodium: 138 mmol/L (ref 135–145)
Sodium: 139 mmol/L (ref 135–145)
TCO2: 24 mmol/L (ref 22–32)
TCO2: 25 mmol/L (ref 22–32)
TCO2: 22 mmol/L (ref 22–32)
TCO2: 22 mmol/L (ref 22–32)
TCO2: 26 mmol/L (ref 22–32)
TCO2: 28 mmol/L (ref 22–32)
TCO2: 26 mmol/L (ref 22–32)
TCO2: 25 mmol/L (ref 22–32)
TCO2: 23 mmol/L (ref 22–32)
TCO2: 24 mmol/L (ref 22–32)
pCO2 arterial: 37.4 mmHg (ref 32–48)
pCO2 arterial: 58.3 mmHg — ABNORMAL HIGH (ref 32–48)
pCO2 arterial: 36.2 mmHg (ref 32–48)
pCO2 arterial: 39.6 mmHg (ref 32–48)
pCO2 arterial: 61.7 mmHg — ABNORMAL HIGH (ref 32–48)
pCO2 arterial: 59.1 mmHg — ABNORMAL HIGH (ref 32–48)
pCO2 arterial: 52.8 mmHg — ABNORMAL HIGH (ref 32–48)
pCO2 arterial: 48.2 mmHg — ABNORMAL HIGH (ref 32–48)
pCO2 arterial: 42.5 mmHg (ref 32–48)
pCO2 arterial: 48.3 mmHg — ABNORMAL HIGH (ref 32–48)
pH, Arterial: 7.207 — ABNORMAL LOW (ref 7.35–7.45)
pH, Arterial: 7.399 (ref 7.35–7.45)
pH, Arterial: 7.368 (ref 7.35–7.45)
pH, Arterial: 7.323 — ABNORMAL LOW (ref 7.35–7.45)
pH, Arterial: 7.182 — CL (ref 7.35–7.45)
pH, Arterial: 7.249 — ABNORMAL LOW (ref 7.35–7.45)
pH, Arterial: 7.274 — ABNORMAL LOW (ref 7.35–7.45)
pH, Arterial: 7.292 — ABNORMAL LOW (ref 7.35–7.45)
pH, Arterial: 7.32 — ABNORMAL LOW (ref 7.35–7.45)
pH, Arterial: 7.272 — ABNORMAL LOW (ref 7.35–7.45)
pO2, Arterial: 279 mmHg — ABNORMAL HIGH (ref 83–108)
pO2, Arterial: 305 mmHg — ABNORMAL HIGH (ref 83–108)
pO2, Arterial: 244 mmHg — ABNORMAL HIGH (ref 83–108)
pO2, Arterial: 161 mmHg — ABNORMAL HIGH (ref 83–108)
pO2, Arterial: 66 mmHg — ABNORMAL LOW (ref 83–108)
pO2, Arterial: 244 mmHg — ABNORMAL HIGH (ref 83–108)
pO2, Arterial: 180 mmHg — ABNORMAL HIGH (ref 83–108)
pO2, Arterial: 101 mmHg (ref 83–108)
pO2, Arterial: 103 mmHg (ref 83–108)
pO2, Arterial: 74 mmHg — ABNORMAL LOW (ref 83–108)

## 2023-09-03 LAB — BASIC METABOLIC PANEL
Anion gap: 8 (ref 5–15)
BUN: 16 mg/dL (ref 8–23)
CO2: 23 mmol/L (ref 22–32)
Calcium: 7.3 mg/dL — ABNORMAL LOW (ref 8.9–10.3)
Chloride: 106 mmol/L (ref 98–111)
Creatinine, Ser: 1.47 mg/dL — ABNORMAL HIGH (ref 0.61–1.24)
GFR, Estimated: 50 mL/min — ABNORMAL LOW (ref >60)
Glucose, Bld: 128 mg/dL — ABNORMAL HIGH (ref 70–99)
Potassium: 4.6 mmol/L (ref 3.5–5.1)
Sodium: 137 mmol/L (ref 135–145)

## 2023-09-03 LAB — ECHO INTRAOPERATIVE TEE
Area-P 1/2: 3.72 cm2
Height: 68 in
Weight: 2800 [oz_av]

## 2023-09-03 LAB — CBC
HCT: 37.7 % — ABNORMAL LOW (ref 39.0–52.0)
HCT: 36.2 % — ABNORMAL LOW (ref 39.0–52.0)
Hemoglobin: 12.8 g/dL — ABNORMAL LOW (ref 13.0–17.0)
Hemoglobin: 12.5 g/dL — ABNORMAL LOW (ref 13.0–17.0)
MCH: 29.5 pg (ref 26.0–34.0)
MCH: 29.3 pg (ref 26.0–34.0)
MCHC: 34 g/dL (ref 30.0–36.0)
MCHC: 34.5 g/dL (ref 30.0–36.0)
MCV: 86.9 fL (ref 80.0–100.0)
MCV: 84.8 fL (ref 80.0–100.0)
Platelets: 81 10*3/uL — ABNORMAL LOW (ref 150–400)
Platelets: 86 10*3/uL — ABNORMAL LOW (ref 150–400)
RBC: 4.34 MIL/uL (ref 4.22–5.81)
RBC: 4.27 MIL/uL (ref 4.22–5.81)
RDW: 14.4 % (ref 11.5–15.5)
RDW: 14.4 % (ref 11.5–15.5)
WBC: 8 10*3/uL (ref 4.0–10.5)
WBC: 7.9 10*3/uL (ref 4.0–10.5)
nRBC: 0 % (ref 0.0–0.2)
nRBC: 0 % (ref 0.0–0.2)

## 2023-09-03 LAB — POCT I-STAT EG7
Acid-base deficit: 2 mmol/L (ref 0.0–2.0)
Bicarbonate: 23.6 mmol/L (ref 20.0–28.0)
Calcium, Ion: 1.08 mmol/L — ABNORMAL LOW (ref 1.15–1.40)
HCT: 20 % — ABNORMAL LOW (ref 39.0–52.0)
Hemoglobin: 6.8 g/dL — CL (ref 13.0–17.0)
O2 Saturation: 63 %
Potassium: 5.1 mmol/L (ref 3.5–5.1)
Sodium: 134 mmol/L — ABNORMAL LOW (ref 135–145)
TCO2: 25 mmol/L (ref 22–32)
pCO2, Ven: 42 mmHg — ABNORMAL LOW (ref 44–60)
pH, Ven: 7.358 (ref 7.25–7.43)
pO2, Ven: 34 mmHg (ref 32–45)

## 2023-09-03 LAB — POCT I-STAT, CHEM 8
BUN: 17 mg/dL (ref 8–23)
BUN: 16 mg/dL (ref 8–23)
BUN: 16 mg/dL (ref 8–23)
BUN: 15 mg/dL (ref 8–23)
BUN: 16 mg/dL (ref 8–23)
BUN: 15 mg/dL (ref 8–23)
Calcium, Ion: 1.3 mmol/L (ref 1.15–1.40)
Calcium, Ion: 1.27 mmol/L (ref 1.15–1.40)
Calcium, Ion: 1.06 mmol/L — ABNORMAL LOW (ref 1.15–1.40)
Calcium, Ion: 0.99 mmol/L — ABNORMAL LOW (ref 1.15–1.40)
Calcium, Ion: 0.99 mmol/L — ABNORMAL LOW (ref 1.15–1.40)
Calcium, Ion: 1.25 mmol/L (ref 1.15–1.40)
Chloride: 103 mmol/L (ref 98–111)
Chloride: 103 mmol/L (ref 98–111)
Chloride: 100 mmol/L (ref 98–111)
Chloride: 97 mmol/L — ABNORMAL LOW (ref 98–111)
Chloride: 98 mmol/L (ref 98–111)
Chloride: 101 mmol/L (ref 98–111)
Creatinine, Ser: 1.3 mg/dL — ABNORMAL HIGH (ref 0.61–1.24)
Creatinine, Ser: 1.2 mg/dL (ref 0.61–1.24)
Creatinine, Ser: 1.2 mg/dL (ref 0.61–1.24)
Creatinine, Ser: 1.1 mg/dL (ref 0.61–1.24)
Creatinine, Ser: 1.1 mg/dL (ref 0.61–1.24)
Creatinine, Ser: 1 mg/dL (ref 0.61–1.24)
Glucose, Bld: 118 mg/dL — ABNORMAL HIGH (ref 70–99)
Glucose, Bld: 157 mg/dL — ABNORMAL HIGH (ref 70–99)
Glucose, Bld: 187 mg/dL — ABNORMAL HIGH (ref 70–99)
Glucose, Bld: 258 mg/dL — ABNORMAL HIGH (ref 70–99)
Glucose, Bld: 279 mg/dL — ABNORMAL HIGH (ref 70–99)
Glucose, Bld: 226 mg/dL — ABNORMAL HIGH (ref 70–99)
HCT: 27 % — ABNORMAL LOW (ref 39.0–52.0)
HCT: 30 % — ABNORMAL LOW (ref 39.0–52.0)
HCT: 24 % — ABNORMAL LOW (ref 39.0–52.0)
HCT: 26 % — ABNORMAL LOW (ref 39.0–52.0)
HCT: 27 % — ABNORMAL LOW (ref 39.0–52.0)
HCT: 26 % — ABNORMAL LOW (ref 39.0–52.0)
Hemoglobin: 9.2 g/dL — ABNORMAL LOW (ref 13.0–17.0)
Hemoglobin: 10.2 g/dL — ABNORMAL LOW (ref 13.0–17.0)
Hemoglobin: 8.2 g/dL — ABNORMAL LOW (ref 13.0–17.0)
Hemoglobin: 8.8 g/dL — ABNORMAL LOW (ref 13.0–17.0)
Hemoglobin: 9.2 g/dL — ABNORMAL LOW (ref 13.0–17.0)
Hemoglobin: 8.8 g/dL — ABNORMAL LOW (ref 13.0–17.0)
Potassium: 4.1 mmol/L (ref 3.5–5.1)
Potassium: 4.3 mmol/L (ref 3.5–5.1)
Potassium: 5.2 mmol/L — ABNORMAL HIGH (ref 3.5–5.1)
Potassium: 4 mmol/L (ref 3.5–5.1)
Potassium: 5.1 mmol/L (ref 3.5–5.1)
Potassium: 4.3 mmol/L (ref 3.5–5.1)
Sodium: 137 mmol/L (ref 135–145)
Sodium: 137 mmol/L (ref 135–145)
Sodium: 134 mmol/L — ABNORMAL LOW (ref 135–145)
Sodium: 134 mmol/L — ABNORMAL LOW (ref 135–145)
Sodium: 135 mmol/L (ref 135–145)
Sodium: 136 mmol/L (ref 135–145)
TCO2: 23 mmol/L (ref 22–32)
TCO2: 24 mmol/L (ref 22–32)
TCO2: 24 mmol/L (ref 22–32)
TCO2: 24 mmol/L (ref 22–32)
TCO2: 24 mmol/L (ref 22–32)
TCO2: 21 mmol/L — ABNORMAL LOW (ref 22–32)

## 2023-09-03 LAB — GLUCOSE, CAPILLARY
Glucose-Capillary: 120 mg/dL — ABNORMAL HIGH (ref 70–99)
Glucose-Capillary: 126 mg/dL — ABNORMAL HIGH (ref 70–99)
Glucose-Capillary: 127 mg/dL — ABNORMAL HIGH (ref 70–99)
Glucose-Capillary: 141 mg/dL — ABNORMAL HIGH (ref 70–99)

## 2023-09-03 LAB — MAGNESIUM: Magnesium: 2.9 mg/dL — ABNORMAL HIGH (ref 1.7–2.4)

## 2023-09-03 LAB — APTT: aPTT: 41 s — ABNORMAL HIGH (ref 24–36)

## 2023-09-03 LAB — HEMOGLOBIN AND HEMATOCRIT, BLOOD
HCT: 27 % — ABNORMAL LOW (ref 39.0–52.0)
Hemoglobin: 9.4 g/dL — ABNORMAL LOW (ref 13.0–17.0)

## 2023-09-03 LAB — PROTIME-INR
INR: 1.8 — ABNORMAL HIGH (ref 0.8–1.2)
Prothrombin Time: 21.4 s — ABNORMAL HIGH (ref 11.4–15.2)

## 2023-09-03 LAB — PLATELET COUNT: Platelets: 119 10*3/uL — ABNORMAL LOW (ref 150–400)

## 2023-09-03 SURGERY — REDO CORONARY ARTERY BYPASS GRAFTING (CABG)
Anesthesia: General

## 2023-09-03 MED ORDER — SODIUM BICARBONATE 8.4 % IV SOLN
INTRAVENOUS | Status: AC
  Administered 2023-09-03: 50 meq via INTRAVENOUS
  Filled 2023-09-03: qty 50

## 2023-09-03 MED ORDER — VASOPRESSIN 20 UNIT/ML IV SOLN
INTRAVENOUS | Status: DC | PRN
Start: 2023-09-03 — End: 2023-09-03
  Administered 2023-09-03: 2 [IU] via INTRAVENOUS
  Administered 2023-09-03 (×12): 1 [IU] via INTRAVENOUS

## 2023-09-03 MED ORDER — ORAL CARE MOUTH RINSE: 15.0000 mL | Freq: Once | OROMUCOSAL | Status: AC

## 2023-09-03 MED ORDER — PANTOPRAZOLE SODIUM 40 MG IV SOLR
40.0000 mg | Freq: Every day | INTRAVENOUS | Status: AC
  Administered 2023-09-03 – 2023-09-04 (×2): 40 mg via INTRAVENOUS
  Filled 2023-09-03 (×2): qty 10

## 2023-09-03 MED ORDER — DEXAMETHASONE SODIUM PHOSPHATE 10 MG/ML IJ SOLN
INTRAMUSCULAR | Status: DC | PRN
  Administered 2023-09-03: 10 mg via INTRAVENOUS

## 2023-09-03 MED ORDER — SODIUM CHLORIDE 0.9 % IV SOLN: INTRAVENOUS | Status: DC | PRN

## 2023-09-03 MED ORDER — METOPROLOL TARTRATE 25 MG/10 ML ORAL SUSPENSION: 12.5000 mg | Freq: Two times a day (BID) | ORAL | Status: DC

## 2023-09-03 MED ORDER — PHENYLEPHRINE 80 MCG/ML (10ML) SYRINGE FOR IV PUSH (FOR BLOOD PRESSURE SUPPORT)
PREFILLED_SYRINGE | INTRAVENOUS | Status: DC | PRN
  Administered 2023-09-03: 80 ug via INTRAVENOUS
  Administered 2023-09-03: 160 ug via INTRAVENOUS

## 2023-09-03 MED ORDER — FENTANYL CITRATE (PF) 250 MCG/5ML IJ SOLN
INTRAMUSCULAR | Status: AC
  Filled 2023-09-03: qty 5

## 2023-09-03 MED ORDER — ASPIRIN 325 MG PO TBEC
325.0000 mg | DELAYED_RELEASE_TABLET | Freq: Every day | ORAL | Status: DC
  Administered 2023-09-04: 325 mg via ORAL
  Filled 2023-09-03: qty 1

## 2023-09-03 MED ORDER — PHENYLEPHRINE HCL-NACL 20-0.9 MG/250ML-% IV SOLN: 0.0000 ug/min | INTRAVENOUS | Status: DC

## 2023-09-03 MED ORDER — NOREPINEPHRINE BITARTRATE 1 MG/ML IV SOLN
INTRAVENOUS | Status: DC | PRN
  Administered 2023-09-03 (×2): 1 mL via INTRAVENOUS

## 2023-09-03 MED ORDER — INSULIN REGULAR(HUMAN) IN NACL 100-0.9 UT/100ML-% IV SOLN: INTRAVENOUS | Status: DC

## 2023-09-03 MED ORDER — DOCUSATE SODIUM 100 MG PO CAPS
200.0000 mg | ORAL_CAPSULE | Freq: Every day | ORAL | Status: DC
  Administered 2023-09-04 – 2023-09-05 (×2): 200 mg via ORAL
  Filled 2023-09-03 (×2): qty 2

## 2023-09-03 MED ORDER — LACTATED RINGERS IV SOLN: INTRAVENOUS | Status: DC

## 2023-09-03 MED ORDER — PHENYLEPHRINE 80 MCG/ML (10ML) SYRINGE FOR IV PUSH (FOR BLOOD PRESSURE SUPPORT)
PREFILLED_SYRINGE | INTRAVENOUS | Status: AC
  Filled 2023-09-03: qty 10

## 2023-09-03 MED ORDER — VASOPRESSIN 20 UNIT/ML IV SOLN
INTRAVENOUS | Status: AC
  Filled 2023-09-03: qty 1

## 2023-09-03 MED ORDER — METOPROLOL TARTRATE 12.5 MG HALF TABLET
12.5000 mg | ORAL_TABLET | Freq: Once | ORAL | Status: DC
  Filled 2023-09-03: qty 1

## 2023-09-03 MED ORDER — BISACODYL 5 MG PO TBEC
10.0000 mg | DELAYED_RELEASE_TABLET | Freq: Every day | ORAL | Status: DC
  Administered 2023-09-04 – 2023-09-05 (×2): 10 mg via ORAL
  Filled 2023-09-03 (×2): qty 2

## 2023-09-03 MED ORDER — METOCLOPRAMIDE HCL 5 MG/ML IJ SOLN
10.0000 mg | Freq: Four times a day (QID) | INTRAMUSCULAR | Status: AC
  Administered 2023-09-03 – 2023-09-04 (×6): 10 mg via INTRAVENOUS
  Filled 2023-09-03 (×5): qty 2

## 2023-09-03 MED ORDER — CHLORHEXIDINE GLUCONATE 4 % EX SOLN: 30.0000 mL | CUTANEOUS | Status: DC

## 2023-09-03 MED ORDER — NOREPINEPHRINE 4 MG/250ML-% IV SOLN
0.0000 ug/min | INTRAVENOUS | Status: DC
  Administered 2023-09-03: 4 ug/min via INTRAVENOUS

## 2023-09-03 MED ORDER — PROTAMINE SULFATE 10 MG/ML IV SOLN
INTRAVENOUS | Status: DC | PRN
  Administered 2023-09-03: 280 mg via INTRAVENOUS

## 2023-09-03 MED ORDER — DEXTROSE 50 % IV SOLN: 0.0000 mL | INTRAVENOUS | Status: DC | PRN

## 2023-09-03 MED ORDER — EPINEPHRINE 1 MG/10ML IJ SOSY
PREFILLED_SYRINGE | INTRAMUSCULAR | Status: AC
  Filled 2023-09-03: qty 10

## 2023-09-03 MED ORDER — VASOPRESSIN 20 UNIT/ML IV SOLN
INTRAVENOUS | Status: DC | PRN
  Administered 2023-09-03: .03 [IU]/min via INTRAVENOUS

## 2023-09-03 MED ORDER — ROCURONIUM BROMIDE 10 MG/ML (PF) SYRINGE
PREFILLED_SYRINGE | INTRAVENOUS | Status: AC
  Filled 2023-09-03: qty 10

## 2023-09-03 MED ORDER — FENTANYL CITRATE (PF) 250 MCG/5ML IJ SOLN
INTRAMUSCULAR | Status: DC | PRN
  Administered 2023-09-03: 50 ug via INTRAVENOUS
  Administered 2023-09-03: 100 ug via INTRAVENOUS
  Administered 2023-09-03: 150 ug via INTRAVENOUS
  Administered 2023-09-03 (×3): 100 ug via INTRAVENOUS
  Administered 2023-09-03: 200 ug via INTRAVENOUS
  Administered 2023-09-03 (×3): 100 ug via INTRAVENOUS

## 2023-09-03 MED ORDER — 0.9 % SODIUM CHLORIDE (POUR BTL) OPTIME
TOPICAL | Status: DC | PRN
Start: 2023-09-03 — End: 2023-09-03
  Administered 2023-09-03: 5000 mL

## 2023-09-03 MED ORDER — METOPROLOL TARTRATE 5 MG/5ML IV SOLN: 2.5000 mg | INTRAVENOUS | Status: DC | PRN

## 2023-09-03 MED ORDER — METOPROLOL TARTRATE 12.5 MG HALF TABLET
12.5000 mg | ORAL_TABLET | Freq: Two times a day (BID) | ORAL | Status: DC
  Administered 2023-09-04 – 2023-09-05 (×3): 12.5 mg via ORAL
  Filled 2023-09-03 (×3): qty 1

## 2023-09-03 MED ORDER — TRAMADOL HCL 50 MG PO TABS
50.0000 mg | ORAL_TABLET | ORAL | Status: DC | PRN
  Administered 2023-09-03 – 2023-09-04 (×2): 100 mg via ORAL
  Filled 2023-09-03 (×2): qty 2

## 2023-09-03 MED ORDER — BISACODYL 10 MG RE SUPP: 10.0000 mg | Freq: Every day | RECTAL | Status: DC

## 2023-09-03 MED ORDER — SODIUM CHLORIDE 0.45 % IV SOLN: INTRAVENOUS | Status: DC | PRN

## 2023-09-03 MED ORDER — CEFAZOLIN SODIUM-DEXTROSE 2-4 GM/100ML-% IV SOLN
2.0000 g | Freq: Three times a day (TID) | INTRAVENOUS | Status: AC
  Administered 2023-09-03 – 2023-09-05 (×6): 2 g via INTRAVENOUS
  Filled 2023-09-03 (×6): qty 100

## 2023-09-03 MED ORDER — ACETAMINOPHEN 500 MG PO TABS
1000.0000 mg | ORAL_TABLET | Freq: Four times a day (QID) | ORAL | Status: DC
  Administered 2023-09-03 – 2023-09-06 (×11): 1000 mg via ORAL
  Filled 2023-09-03 (×14): qty 2

## 2023-09-03 MED ORDER — ALBUTEROL SULFATE HFA 108 (90 BASE) MCG/ACT IN AERS
INHALATION_SPRAY | RESPIRATORY_TRACT | Status: DC | PRN
Start: 2023-09-03 — End: 2023-09-03
  Administered 2023-09-03: 4 via RESPIRATORY_TRACT

## 2023-09-03 MED ORDER — ~~LOC~~ CARDIAC SURGERY, PATIENT & FAMILY EDUCATION
Freq: Once | Status: DC
  Filled 2023-09-03: qty 1

## 2023-09-03 MED ORDER — SODIUM CHLORIDE 0.9% FLUSH
3.0000 mL | Freq: Two times a day (BID) | INTRAVENOUS | Status: DC
  Administered 2023-09-04 – 2023-09-05 (×2): 3 mL via INTRAVENOUS

## 2023-09-03 MED ORDER — OXYCODONE HCL 5 MG PO TABS
5.0000 mg | ORAL_TABLET | ORAL | Status: DC | PRN
  Administered 2023-09-03: 5 mg via ORAL
  Administered 2023-09-04 – 2023-09-05 (×3): 10 mg via ORAL
  Filled 2023-09-03 (×3): qty 2
  Filled 2023-09-03: qty 1

## 2023-09-03 MED ORDER — SODIUM CHLORIDE 0.9 % IV SOLN: INTRAVENOUS | Status: DC

## 2023-09-03 MED ORDER — SODIUM BICARBONATE 8.4 % IV SOLN: 50.0000 meq | Freq: Once | INTRAVENOUS | Status: AC

## 2023-09-03 MED ORDER — MAGNESIUM SULFATE 4 GM/100ML IV SOLN
4.0000 g | Freq: Once | INTRAVENOUS | Status: AC
  Administered 2023-09-03: 4 g via INTRAVENOUS
  Filled 2023-09-03: qty 100

## 2023-09-03 MED ORDER — CALCIUM CHLORIDE 10 % IV SOLN
INTRAVENOUS | Status: AC
  Filled 2023-09-03: qty 10

## 2023-09-03 MED ORDER — CISATRACURIUM BESYLATE 20 MG/10ML IV SOLN
INTRAVENOUS | Status: AC
  Filled 2023-09-03: qty 10

## 2023-09-03 MED ORDER — MORPHINE SULFATE (PF) 2 MG/ML IV SOLN
1.0000 mg | INTRAVENOUS | Status: DC | PRN
  Administered 2023-09-03 – 2023-09-04 (×2): 2 mg via INTRAVENOUS
  Filled 2023-09-03 (×2): qty 1

## 2023-09-03 MED ORDER — ALBUTEROL SULFATE HFA 108 (90 BASE) MCG/ACT IN AERS
INHALATION_SPRAY | RESPIRATORY_TRACT | Status: AC
  Filled 2023-09-03: qty 6.7

## 2023-09-03 MED ORDER — THROMBIN 20000 UNITS EX SOLR
CUTANEOUS | Status: DC | PRN
  Administered 2023-09-03: 20000 [IU] via TOPICAL

## 2023-09-03 MED ORDER — CHLORHEXIDINE GLUCONATE 0.12 % MT SOLN: 15.0000 mL | Freq: Once | OROMUCOSAL | Status: DC

## 2023-09-03 MED ORDER — PANTOPRAZOLE SODIUM 40 MG PO TBEC
40.0000 mg | DELAYED_RELEASE_TABLET | Freq: Every day | ORAL | Status: DC
  Administered 2023-09-05 – 2023-09-08 (×4): 40 mg via ORAL
  Filled 2023-09-03 (×4): qty 1

## 2023-09-03 MED ORDER — CHLORHEXIDINE GLUCONATE 0.12 % MT SOLN
15.0000 mL | Freq: Once | OROMUCOSAL | Status: AC
  Administered 2023-09-03: 15 mL via OROMUCOSAL
  Filled 2023-09-03: qty 15

## 2023-09-03 MED ORDER — LIDOCAINE 2% (20 MG/ML) 5 ML SYRINGE
INTRAMUSCULAR | Status: AC
  Filled 2023-09-03: qty 5

## 2023-09-03 MED ORDER — ONDANSETRON HCL 4 MG/2ML IJ SOLN
4.0000 mg | Freq: Four times a day (QID) | INTRAMUSCULAR | Status: DC | PRN
  Administered 2023-09-05: 4 mg via INTRAVENOUS
  Filled 2023-09-03 (×2): qty 2

## 2023-09-03 MED ORDER — PLASMA-LYTE A IV SOLN
INTRAVENOUS | Status: DC | PRN
  Administered 2023-09-03: 500 mL via INTRAVASCULAR

## 2023-09-03 MED ORDER — MIDAZOLAM HCL 2 MG/2ML IJ SOLN: 2.0000 mg | INTRAMUSCULAR | Status: DC | PRN

## 2023-09-03 MED ORDER — EPHEDRINE 5 MG/ML INJ
INTRAVENOUS | Status: AC
  Filled 2023-09-03: qty 5

## 2023-09-03 MED ORDER — ROSUVASTATIN CALCIUM 20 MG PO TABS
40.0000 mg | ORAL_TABLET | Freq: Every day | ORAL | Status: DC
  Administered 2023-09-04 – 2023-09-08 (×5): 40 mg via ORAL
  Filled 2023-09-03 (×5): qty 2

## 2023-09-03 MED ORDER — SODIUM CHLORIDE 0.9% FLUSH
3.0000 mL | INTRAVENOUS | Status: DC | PRN
  Administered 2023-09-04: 3 mL via INTRAVENOUS

## 2023-09-03 MED ORDER — EPINEPHRINE 1 MG/10ML IJ SOSY
PREFILLED_SYRINGE | INTRAMUSCULAR | Status: DC | PRN
  Administered 2023-09-03 (×2): 20 ug via INTRAVENOUS

## 2023-09-03 MED ORDER — CISATRACURIUM BESYLATE (PF) 10 MG/5ML IV SOLN
INTRAVENOUS | Status: DC | PRN
  Administered 2023-09-03 (×4): 2 mg via INTRAVENOUS

## 2023-09-03 MED ORDER — ALBUMIN HUMAN 5 % IV SOLN: INTRAVENOUS | Status: DC | PRN

## 2023-09-03 MED ORDER — CHLORHEXIDINE GLUCONATE CLOTH 2 % EX PADS
6.0000 | MEDICATED_PAD | Freq: Every day | CUTANEOUS | Status: DC
  Administered 2023-09-03 – 2023-09-05 (×3): 6 via TOPICAL

## 2023-09-03 MED ORDER — NITROGLYCERIN IN D5W 200-5 MCG/ML-% IV SOLN: 0.0000 ug/min | INTRAVENOUS | Status: DC

## 2023-09-03 MED ORDER — SODIUM BICARBONATE 8.4 % IV SOLN
100.0000 meq | Freq: Once | INTRAVENOUS | Status: AC
  Administered 2023-09-03: 100 meq via INTRAVENOUS

## 2023-09-03 MED ORDER — LACTATED RINGERS IV SOLN
INTRAVENOUS | Status: DC | PRN
Start: 2023-09-03 — End: 2023-09-03

## 2023-09-03 MED ORDER — ACETAMINOPHEN 160 MG/5ML PO SOLN
650.0000 mg | Freq: Once | ORAL | Status: AC
  Administered 2023-09-03: 650 mg
  Filled 2023-09-03: qty 20.3

## 2023-09-03 MED ORDER — VANCOMYCIN HCL IN DEXTROSE 1-5 GM/200ML-% IV SOLN
1000.0000 mg | Freq: Once | INTRAVENOUS | Status: AC
  Administered 2023-09-03: 1000 mg via INTRAVENOUS
  Filled 2023-09-03: qty 200

## 2023-09-03 MED ORDER — ROCURONIUM BROMIDE 10 MG/ML (PF) SYRINGE
PREFILLED_SYRINGE | INTRAVENOUS | Status: DC | PRN
  Administered 2023-09-03: 100 mg via INTRAVENOUS
  Administered 2023-09-03: 50 mg via INTRAVENOUS

## 2023-09-03 MED ORDER — ASPIRIN 81 MG PO CHEW: 324.0000 mg | CHEWABLE_TABLET | Freq: Every day | ORAL | Status: DC

## 2023-09-03 MED ORDER — DIPHENHYDRAMINE HCL 50 MG/ML IJ SOLN
INTRAMUSCULAR | Status: DC | PRN
Start: 2023-09-03 — End: 2023-09-03
  Administered 2023-09-03: 25 mg via INTRAVENOUS

## 2023-09-03 MED ORDER — THROMBIN 20000 UNITS EX SOLR
OROMUCOSAL | Status: DC | PRN
  Administered 2023-09-03 (×3): 4 mL via TOPICAL

## 2023-09-03 MED ORDER — CHLORHEXIDINE GLUCONATE 0.12 % MT SOLN
15.0000 mL | OROMUCOSAL | Status: AC
  Administered 2023-09-03: 15 mL via OROMUCOSAL
  Filled 2023-09-03: qty 15

## 2023-09-03 MED ORDER — ASPIRIN 81 MG PO CHEW
324.0000 mg | CHEWABLE_TABLET | Freq: Once | ORAL | Status: AC
  Administered 2023-09-03: 324 mg via ORAL
  Filled 2023-09-03: qty 4

## 2023-09-03 MED ORDER — HEMOSTATIC AGENTS (NO CHARGE) OPTIME
TOPICAL | Status: DC | PRN
Start: 2023-09-03 — End: 2023-09-03
  Administered 2023-09-03: 1 via TOPICAL

## 2023-09-03 MED ORDER — SODIUM CHLORIDE 0.9 % IV SOLN: 250.0000 mL | INTRAVENOUS | Status: DC

## 2023-09-03 MED ORDER — THROMBIN (RECOMBINANT) 20000 UNITS EX SOLR
CUTANEOUS | Status: AC
  Filled 2023-09-03: qty 20000

## 2023-09-03 MED ORDER — ACETAMINOPHEN 160 MG/5ML PO SOLN: 1000.0000 mg | Freq: Four times a day (QID) | ORAL | Status: DC

## 2023-09-03 MED ORDER — PROPOFOL 10 MG/ML IV BOLUS
INTRAVENOUS | Status: AC
  Filled 2023-09-03: qty 20

## 2023-09-03 MED ORDER — POTASSIUM CHLORIDE 10 MEQ/50ML IV SOLN: 10.0000 meq | INTRAVENOUS | Status: AC

## 2023-09-03 MED ORDER — TAMSULOSIN HCL 0.4 MG PO CAPS
0.4000 mg | ORAL_CAPSULE | Freq: Every day | ORAL | Status: DC
  Administered 2023-09-04 – 2023-09-07 (×4): 0.4 mg via ORAL
  Filled 2023-09-03 (×4): qty 1

## 2023-09-03 MED ORDER — EPHEDRINE SULFATE-NACL 50-0.9 MG/10ML-% IV SOSY
PREFILLED_SYRINGE | INTRAVENOUS | Status: DC | PRN
  Administered 2023-09-03 (×4): 5 mg via INTRAVENOUS

## 2023-09-03 MED ORDER — CLEVIDIPINE BUTYRATE 0.5 MG/ML IV EMUL
INTRAVENOUS | Status: AC
  Filled 2023-09-03: qty 50

## 2023-09-03 MED ORDER — MIDAZOLAM HCL (PF) 5 MG/ML IJ SOLN
INTRAMUSCULAR | Status: DC | PRN
  Administered 2023-09-03: 3 mg via INTRAVENOUS
  Administered 2023-09-03 (×7): 1 mg via INTRAVENOUS

## 2023-09-03 MED ORDER — ACETAMINOPHEN 500 MG PO TABS: 1000.0000 mg | ORAL_TABLET | Freq: Once | ORAL | Status: DC

## 2023-09-03 MED ORDER — LACTATED RINGERS IV SOLN: INTRAVENOUS | Status: DC | PRN

## 2023-09-03 MED ORDER — HEPARIN SODIUM (PORCINE) 1000 UNIT/ML IJ SOLN
INTRAMUSCULAR | Status: DC | PRN
  Administered 2023-09-03: 26000 [IU] via INTRAVENOUS

## 2023-09-03 MED ORDER — MIDAZOLAM HCL (PF) 10 MG/2ML IJ SOLN
INTRAMUSCULAR | Status: AC
  Filled 2023-09-03: qty 2

## 2023-09-03 MED ORDER — VASOPRESSIN 20 UNITS/100 ML INFUSION FOR SHOCK: 0.0000 [IU]/min | INTRAVENOUS | Status: DC

## 2023-09-03 MED ORDER — ALBUMIN HUMAN 5 % IV SOLN
250.0000 mL | INTRAVENOUS | Status: DC | PRN
  Administered 2023-09-03 (×2): 12.5 g via INTRAVENOUS

## 2023-09-03 MED ORDER — SODIUM CHLORIDE 0.9% IV SOLUTION: Freq: Once | INTRAVENOUS | Status: DC

## 2023-09-03 MED ORDER — PROPOFOL 10 MG/ML IV BOLUS
INTRAVENOUS | Status: DC | PRN
  Administered 2023-09-03 (×2): 20 mg via INTRAVENOUS
  Administered 2023-09-03: 50 mg via INTRAVENOUS

## 2023-09-03 MED ORDER — DEXMEDETOMIDINE HCL IN NACL 400 MCG/100ML IV SOLN
0.0000 ug/kg/h | INTRAVENOUS | Status: DC
  Filled 2023-09-03: qty 100

## 2023-09-03 SURGICAL SUPPLY — 115 items
ADAPTER CARDIO PERF ANTE/RETRO (ADAPTER) IMPLANT
ADH SKN CLS APL DERMABOND .7 (GAUZE/BANDAGES/DRESSINGS) ×2
ADPR CRDPLG 7.5 .25D 1 LRG Y (ADAPTER) ×2
ADPR PRFSN 84XANTGRD RTRGD (ADAPTER)
BAG DECANTER FOR FLEXI CONT (MISCELLANEOUS) ×1 IMPLANT
BLADE CLIPPER SURG (BLADE) ×1 IMPLANT
BNDG CMPR 5X4 KNIT ELC UNQ LF (GAUZE/BANDAGES/DRESSINGS) ×1
BNDG CMPR 6 X 5 YARDS HK CLSR (GAUZE/BANDAGES/DRESSINGS) ×1
BNDG ELASTIC 4INX 5YD STR LF (GAUZE/BANDAGES/DRESSINGS) IMPLANT
BNDG ELASTIC 4X5.8 VLCR STR LF (GAUZE/BANDAGES/DRESSINGS) ×1 IMPLANT
BNDG ELASTIC 6INX 5YD STR LF (GAUZE/BANDAGES/DRESSINGS) IMPLANT
BNDG ELASTIC 6X5.8 VLCR STR LF (GAUZE/BANDAGES/DRESSINGS) ×1 IMPLANT
BNDG GAUZE DERMACEA FLUFF 4 (GAUZE/BANDAGES/DRESSINGS) ×1 IMPLANT
BNDG GZE DERMACEA 4 6PLY (GAUZE/BANDAGES/DRESSINGS) ×1
CANISTER SUCT 3000ML PPV (MISCELLANEOUS) ×1 IMPLANT
CANNULA ARTERIAL VENT 3/8 20FR (CANNULA) IMPLANT
CANNULA GUNDRY RCSP 15FR (MISCELLANEOUS) IMPLANT
CANNULA MC2 2 STG 36/46 NON-V (CANNULA) IMPLANT
CATH ROBINSON RED A/P 18FR (CATHETERS) ×2 IMPLANT
CATH THORACIC 28FR (CATHETERS) ×1 IMPLANT
CATH THORACIC 36FR (CATHETERS) ×1 IMPLANT
CATH THORACIC 36FR RT ANG (CATHETERS) ×1 IMPLANT
CLIP FOGARTY SPRING 6M (CLIP) IMPLANT
CLIP TI MEDIUM 24 (CLIP) IMPLANT
CLIP TI WIDE RED SMALL 24 (CLIP) IMPLANT
CNTNR URN SCR LID CUP LEK RST (MISCELLANEOUS) IMPLANT
CONNECTOR STRAIGHT 3/8 (MISCELLANEOUS) IMPLANT
CONT SPEC 4OZ STRL OR WHT (MISCELLANEOUS) ×1
CONTAINER PROTECT SURGISLUSH (MISCELLANEOUS) ×1 IMPLANT
COVER PROBE W GEL 5X96 (DRAPES) IMPLANT
DERMABOND ADVANCED .7 DNX12 (GAUZE/BANDAGES/DRESSINGS) IMPLANT
DRAPE CARDIOVASCULAR INCISE (DRAPES) ×1
DRAPE SRG 135X102X78XABS (DRAPES) ×1 IMPLANT
DRAPE WARM FLUID 44X44 (DRAPES) IMPLANT
DRSG COVADERM 4X14 (GAUZE/BANDAGES/DRESSINGS) ×1 IMPLANT
ELECT BLADE 6.5 EXT (BLADE) IMPLANT
ELECT CAUTERY BLADE 6.4 (BLADE) ×1 IMPLANT
ELECT REM PT RETURN 9FT ADLT (ELECTROSURGICAL) ×2
ELECTRODE REM PT RTRN 9FT ADLT (ELECTROSURGICAL) ×2 IMPLANT
FELT TEFLON 1X6 (MISCELLANEOUS) ×1 IMPLANT
GAUZE 4X4 16PLY ~~LOC~~+RFID DBL (SPONGE) ×1 IMPLANT
GAUZE SPONGE 4X4 12PLY STRL (GAUZE/BANDAGES/DRESSINGS) ×2 IMPLANT
GEL ULTRASOUND 20GR AQUASONIC (MISCELLANEOUS) IMPLANT
GLOVE BIO SURGEON STRL SZ 6 (GLOVE) IMPLANT
GLOVE BIO SURGEON STRL SZ 6.5 (GLOVE) IMPLANT
GLOVE BIO SURGEON STRL SZ7 (GLOVE) IMPLANT
GLOVE BIO SURGEON STRL SZ7.5 (GLOVE) IMPLANT
GLOVE BIOGEL PI IND STRL 6 (GLOVE) IMPLANT
GLOVE BIOGEL PI IND STRL 6.5 (GLOVE) IMPLANT
GLOVE BIOGEL PI IND STRL 7.0 (GLOVE) IMPLANT
GLOVE BIOGEL PI IND STRL 7.5 (GLOVE) IMPLANT
GLOVE INDICATOR 7.5 STRL GRN (GLOVE) IMPLANT
GLOVE ORTHO TXT STRL SZ7.5 (GLOVE) IMPLANT
GLOVE SS BIOGEL STRL SZ 6.5 (GLOVE) IMPLANT
GLOVE SURG MICRO LTX SZ7 (GLOVE) ×2 IMPLANT
GLOVE SURG SS PI 7.0 STRL IVOR (GLOVE) IMPLANT
GLOVE SURG SS PI 7.5 STRL IVOR (GLOVE) IMPLANT
GOWN STRL REUS W/ TWL LRG LVL3 (GOWN DISPOSABLE) ×4 IMPLANT
GOWN STRL REUS W/ TWL XL LVL3 (GOWN DISPOSABLE) ×1 IMPLANT
GOWN STRL REUS W/TWL LRG LVL3 (GOWN DISPOSABLE) ×7
GOWN STRL REUS W/TWL XL LVL3 (GOWN DISPOSABLE) ×3
HEMOSTAT POWDER SURGIFOAM 1G (HEMOSTASIS) ×3 IMPLANT
HEMOSTAT SURGICEL 2X14 (HEMOSTASIS) ×1 IMPLANT
INSERT FOGARTY 61MM (MISCELLANEOUS) IMPLANT
INSERT FOGARTY XLG (MISCELLANEOUS) IMPLANT
KIT BASIN OR (CUSTOM PROCEDURE TRAY) ×1 IMPLANT
KIT CATH CPB BARTLE (MISCELLANEOUS) ×1 IMPLANT
KIT SUCTION CATH 14FR (SUCTIONS) ×1 IMPLANT
KIT TURNOVER KIT B (KITS) ×1 IMPLANT
KIT VASOVIEW HEMOPRO 2 VH 4000 (KITS) IMPLANT
KNIFE MICRO-UNI 3.5 30 DEG (BLADE) ×1 IMPLANT
NS IRRIG 1000ML POUR BTL (IV SOLUTION) ×5 IMPLANT
PACK E OPEN HEART (SUTURE) ×1 IMPLANT
PACK OPEN HEART (CUSTOM PROCEDURE TRAY) ×1 IMPLANT
PAD ARMBOARD 7.5X6 YLW CONV (MISCELLANEOUS) ×2 IMPLANT
PAD DEFIB R2 (MISCELLANEOUS) ×1 IMPLANT
PAD ELECT DEFIB RADIOL ZOLL (MISCELLANEOUS) ×1 IMPLANT
PENCIL BUTTON HOLSTER BLD 10FT (ELECTRODE) ×1 IMPLANT
POSITIONER HEAD DONUT 9IN (MISCELLANEOUS) ×1 IMPLANT
PUNCH AORTIC ROTATE 4.0MM (MISCELLANEOUS) IMPLANT
PUNCH AORTIC ROTATE 4.5MM 8IN (MISCELLANEOUS) ×1 IMPLANT
PUNCH AORTIC ROTATE 5MM 8IN (MISCELLANEOUS) IMPLANT
SET MPS 3-ND DEL (MISCELLANEOUS) IMPLANT
SET Y-VENT ADPTR 7.5 MALE LUER (ADAPTER) IMPLANT
SPONGE INTESTINAL PEANUT (DISPOSABLE) IMPLANT
SPONGE T-LAP 18X18 ~~LOC~~+RFID (SPONGE) ×4 IMPLANT
SPONGE T-LAP 4X18 ~~LOC~~+RFID (SPONGE) ×2 IMPLANT
STOPCOCK 4 WAY LG BORE MALE ST (IV SETS) IMPLANT
SUT BONE WAX W31G (SUTURE) ×1 IMPLANT
SUT MNCRL AB 3-0 PS2 18 (SUTURE) IMPLANT
SUT PROLENE 3 0 SH DA (SUTURE) IMPLANT
SUT PROLENE 3 0 SH1 36 (SUTURE) ×1 IMPLANT
SUT PROLENE 4 0 RB 1 (SUTURE)
SUT PROLENE 4-0 RB1 .5 CRCL 36 (SUTURE) IMPLANT
SUT PROLENE 7 0 BV 1 (SUTURE) IMPLANT
SUT PROLENE 7 0 BV1 MDA (SUTURE) ×1 IMPLANT
SUT PROLENE BLUE 7 0 (SUTURE) IMPLANT
SUT SILK 2 0 SH (SUTURE) IMPLANT
SUT SILK 2 0 SH CR/8 (SUTURE) IMPLANT
SUT STEEL 6MS V (SUTURE) IMPLANT
SUT VIC AB 1 CTX 36 (SUTURE) ×3
SUT VIC AB 1 CTX36XBRD ANBCTR (SUTURE) ×2 IMPLANT
SUT VIC AB 2-0 CT1 27 (SUTURE) ×2
SUT VIC AB 2-0 CT1 TAPERPNT 27 (SUTURE) IMPLANT
SYSTEM SAHARA CHEST DRAIN ATS (WOUND CARE) ×1 IMPLANT
TAPE CLOTH SURG 4X10 WHT LF (GAUZE/BANDAGES/DRESSINGS) IMPLANT
TAPE PAPER 2X10 WHT MICROPORE (GAUZE/BANDAGES/DRESSINGS) IMPLANT
TOWEL GREEN STERILE (TOWEL DISPOSABLE) ×1 IMPLANT
TOWEL GREEN STERILE FF (TOWEL DISPOSABLE) ×1 IMPLANT
TRAY FOLEY SLVR 16FR TEMP STAT (SET/KITS/TRAYS/PACK) ×1 IMPLANT
TUBE SUCT INTRACARD DLP 20F (MISCELLANEOUS) IMPLANT
TUBING ART PRESS 48 MALE/FEM (TUBING) IMPLANT
TUBING LAP HI FLOW INSUFFLATIO (TUBING) IMPLANT
UNDERPAD 30X36 HEAVY ABSORB (UNDERPADS AND DIAPERS) ×1 IMPLANT
WATER STERILE IRR 1000ML POUR (IV SOLUTION) ×2 IMPLANT

## 2023-09-03 NOTE — Discharge Instructions (Addendum)
Discharge Instructions:  1. You may shower, please wash incisions daily with soap and water and keep dry.  If you wish to cover wounds with dressing you may do so but please keep clean and change daily.  No tub baths or swimming until incisions have completely healed.  If your incisions become red or develop any drainage please call our office at [phone]  2. No Driving until cleared by Dr Sharee Pimple office and you are no longer using narcotic pain medications  3. Monitor your weight daily.. Please use the same scale and weigh at same time... If you gain 5-10 lbs in 48 hours with associated lower extremity swelling, please contact our office at [phone]  4. Fever of 101.5 for at least 24 hours with no source, please contact our office at [phone]  5. Activity- up as tolerated, please walk at least 3 times per day.  Avoid strenuous activity, no lifting, pushing, or pulling with your arms over 8-10 lbs for a minimum of 6 weeks  6. If any questions or concerns arise, please do not hesitate to contact our office at [phone]  Information on my medicine - ELIQUIS (apixaban)  Why was Eliquis prescribed for you? Eliquis was prescribed for you to reduce the risk of a blood clot forming that can cause a stroke if you have a medical condition called atrial fibrillation (a type of irregular heartbeat).  What do You need to know about Eliquis ? Take your Eliquis TWICE DAILY - one tablet in the morning and one tablet in the evening with or without food. If you have difficulty swallowing the tablet whole please discuss with your pharmacist how to take the medication safely.  Take Eliquis exactly as prescribed by your doctor and DO NOT stop taking Eliquis without talking to the doctor who prescribed the medication.  Stopping may increase your risk of developing a stroke.  Refill your prescription before you run out.  After discharge, you should have regular check-up appointments with  your healthcare provider that is prescribing your Eliquis.  In the future your dose may need to be changed if your kidney function or weight changes by a significant amount or as you get older.  What do you do if you miss a dose? If you miss a dose, take it as soon as you remember on the same day and resume taking twice daily.  Do not take more than one dose of ELIQUIS at the same time to make up a missed dose.  Important Safety Information A possible side effect of Eliquis is bleeding. You should call your healthcare provider right away if you experience any of the following: Bleeding from an injury or your nose that does not stop. Unusual colored urine (red or dark brown) or unusual colored stools (red or black). Unusual bruising for unknown reasons. A serious fall or if you hit your head (even if there is no bleeding).  Some medicines may interact with Eliquis and might increase your risk of bleeding or clotting while on Eliquis. To help avoid this, consult your healthcare provider or pharmacist prior to using any new prescription or non-prescription medications, including herbals, vitamins, non-steroidal anti-inflammatory drugs (NSAIDs) and supplements.  This website has more information on Eliquis (apixaban): http://www.eliquis.com/eliquis/home

## 2023-09-03 NOTE — Anesthesia Procedure Notes (Signed)
Arterial Line Insertion Start/End9/08/2023 6:50 AM, 09/03/2023 6:55 AM Performed by: Waynard Edwards, CRNA, CRNA  Patient location: Pre-op. Preanesthetic checklist: patient identified, IV checked, site marked, risks and benefits discussed, surgical consent, monitors and equipment checked, pre-op evaluation, timeout performed and anesthesia consent Lidocaine 1% used for infiltration and patient sedated Left, radial was placed Catheter size: 20 G Hand hygiene performed , maximum sterile barriers used  and Seldinger technique used Allen's test indicative of satisfactory collateral circulation Attempts: 2 Procedure performed without using ultrasound guided technique. Following insertion, dressing applied and Biopatch. Post procedure assessment: normal and unchanged  Patient tolerated the procedure well with no immediate complications.

## 2023-09-03 NOTE — Progress Notes (Signed)
Extubation Procedure Note  Patient Details:   Name: Jonathan [name] DOB: May 29, 1951 MRN: [id number]   Airway Documentation:    Vent end date: (not recorded) 09/03/23 Vent end time: (not recorded) 21:23  Evaluation  O2 sats: stable throughout Complications: No apparent complications Patient did tolerate procedure well. Bilateral Breath Sounds: Clear, Diminished   Yes  Nif -24  VC .76   Patient extubated to 4L [place] with no complications. Positive Cuff Leak. No stridor noted. RT will monitor.  Paschal Dopp 09/03/2023, 9:51 PM

## 2023-09-03 NOTE — Brief Op Note (Signed)
09/03/2023  1:00 PM  PATIENT:  Jonathan [name]  72 y.o. male  PRE-OPERATIVE DIAGNOSIS:  CORONARY ARTERY DISEASE  POST-OPERATIVE DIAGNOSIS:  CORONARY ARTERY DISEASE  PROCEDURE:   REDO CORONARY ARTERY BYPASS GRAFTING (CABG) x 3 USING LEFT INTERNAL MAMMARY ARTERY AND LEFT LEG  GREATER SAPHENEOUS VEIN ENDOSCOPICLY HARVESTED  LIMA-OM1 SVG-OM2  TRANSESOPHAGEAL ECHOCARDIOGRAM (N/A) Vein harvest time: Vein prep time:  SURGEON:  [name], MD - Primary  PHYSICIAN ASSISTANT: Everitt Wenner  ASSISTANTS: Maricela Curet, RN, Scrub Person                     Tanda Rockers, RN, RN First Assistant   ANESTHESIA:   general  EBL:   BLOOD ADMINISTERED:none  DRAINS:  Left pleural and mediastinal tubes    LOCAL MEDICATIONS USED:  NONE  COUNTS:  correct  DICTATION: .Dragon Dictation  PLAN OF CARE: Admit to inpatient   PATIENT DISPOSITION:  ICU - intubated and hemodynamically stable.   Delay start of Pharmacological VTE agent (>24hrs) due to surgical blood loss or risk of bleeding: yes

## 2023-09-03 NOTE — Op Note (Signed)
CARDIOVASCULAR SURGERY OPERATIVE NOTE  09/03/2023  Surgeon:  [name], MD  First Assistant: Jillyn Hidden,  PA-C:   An experienced assistant was required given the complexity of this surgery and the standard of surgical care. The assistant was needed for endoscopic vein harvest, exposure, dissection, suctioning, retraction of delicate tissues and sutures, instrument exchange and for overall help during this procedure.   Preoperative Diagnosis:  Severe multi-vessel coronary artery disease   Postoperative Diagnosis:  Same   Procedure:  Redo Median Sternotomy Extracorporeal circulation 3.   Redo Coronary artery bypass grafting x 2  Left internal mammary artery graft to the OM1 SVG to OM2   4.   Endoscopic vein harvest from the left leg   Anesthesia:  General Endotracheal   Clinical History/Surgical Indication:  This 72 year old gentleman has a high-grade in-stent restenosis within the proximal left circumflex that could not be opened with PCI. The LAD has no significant stenosis. The RCA is chronically occluded with a patent radial artery graft to the posterior descending branch. I think the best option is redo coronary bypass graft surgery to the left circumflex system using a left internal mammary graft if it is suitable and a vein graft if it is not. I reviewed the cardiac catheterization images with the patient and his wife and answered their questions.I discussed the operative procedure with the patient and his wife including alternatives, benefits and risks; including but not limited to bleeding, blood transfusion, infection, stroke, myocardial infarction, graft failure, heart block requiring a permanent pacemaker, organ dysfunction, and death. Franne Grip understands and agrees to proceed.   Preparation:  The patient was seen in the preoperative holding area and the  correct patient, correct operation were confirmed with the patient after reviewing the medical record and catheterization. The consent was signed by me. Preoperative antibiotics were given. A pulmonary arterial line and radial arterial line were placed by the anesthesia team. The patient was taken back to the operating room and positioned supine on the operating room table. After being placed under general endotracheal anesthesia by the anesthesia team a foley catheter was placed. The neck, chest, abdomen, and both legs were prepped with betadine soap and solution and draped in the usual sterile manner. A surgical time-out was taken and the correct patient and operative procedure were confirmed with the nursing and anesthesia staff.   Cardiopulmonary Bypass:  A redo median sternotomy was performed using the oscillating saw. The heart was dissected from the sternum using electrocautery. After I harvested the LIMA, the pericardial adhesions were dissected to expose the right atrium and ascending aorta. The radial artery graft to the PDA was identified and preserved.  The ascending aorta was of normal size and had no palpable plaque. There were no contraindications to aortic cannulation or cross-clamping. The patient was fully systemically heparinized and the ACT was maintained > 400 sec. The proximal aortic arch was cannulated with a 20 F aortic cannula for arterial inflow. Venous cannulation was performed via the right atrium using a two-staged venous cannula. An antegrade cardioplegia/vent cannula was inserted into the mid-ascending aorta. Aortic occlusion was performed with a single cross-clamp. Systemic cooling to 32 degrees Centigrade and topical cooling of the heart with iced saline were used. Hyperkalemic antegrade cold blood cardioplegia was used to induce diastolic arrest and was then given at about 20 minute intervals throughout the period of arrest to maintain myocardial temperature at or below 10  degrees centigrade. A temperature probe was inserted into the  interventricular septum and an insulating pad was placed in the pericardium. After arresting the heart the remainder of the pericardial adhesions were divided to expose the lateral wall.    Left internal mammary artery harvest:  The left side of the sternum was retracted using the Rultract retractor. The left internal mammary artery was harvested as a pedicle graft. All side branches were clipped. It was a medium-sized vessel of good quality with excellent blood flow. It was ligated distally and divided. It was sprayed with topical papaverine solution to prevent vasospasm.   Endoscopic vein harvest: Performed by Jillyn Hidden, PA-C  The left greater saphenous vein was harvested endoscopically through a 2 cm incision medial to the left knee. It was harvested from the upper thigh to below the knee. It was a medium-sized vein of good quality. The side branches were all ligated with 4-0 silk ties. We initially planned on using the right GSV but could not find it through a small incision medial to the right knee. We used ultrasound and identified it more posterior than usual and very close to the skin so we decided to leave it alone and use the left GSV.    Coronary arteries:  The coronary arteries were examined.  LAD:  palpable disease proximally but the distal vessel had no disease.  LCX:  OM1 and OM2 were both medium sized vessels with no distal disease.  RCA:  patent radial artery graft to the PDA.   Grafts: Performed by me and assisted by Jillyn Hidden, PA-C.  LIMA to the OM1: 1.6 mm. It was sewn end to side using 8-0 prolene continuous suture. SVG to OM2:  1.6 mm. It was sewn end to side using 7-0 prolene continuous suture.   The proximal vein graft anastomosis was performed to the mid-ascending aorta using continuous 6-0 prolene suture. A graft marker was placed around the proximal anastomosis.  I initially grafted  the OM1 with the LIMA and removed the cross clamp. There were fluctuating ST changes and I was concerned about whether the LIMA flow was adequate for the large LCX territory. I decided that the safest approach was to harvest a segment of GSV to graft the OM2. After grafting OM2 and removing the cross clamp there was return of sinus rhythm with no further ST changes.   Completion:  The patient was rewarmed to 37 degrees Centigrade. The crossclamp was removed with a time of 87 minutes. There was spontaneous return of sinus rhythm. The distal and proximal anastomoses were checked for hemostasis. The position of the grafts was satisfactory. Two temporary epicardial pacing wires were placed on the right atrium and two on the right ventricle. The patient was weaned from CPB without difficulty on vasopressin and NE for vasoplegia. CPB time was 171 minutes. Cardiac output was 5 LPM. TEE showed normal LV systolic function. Heparin was fully reversed with protamine and the aortic and venous cannulas removed. Hemostasis was achieved. Mediastinal and left pleural drainage tubes were placed. The sternum was closed with  #6 stainless steel wires. The fascia was closed with continuous # 1 vicryl suture. The subcutaneous tissue was closed with 2-0 vicryl continuous suture. The skin was closed with 3-0 vicryl subcuticular suture. All sponge, needle, and instrument counts were reported correct at the end of the case. Dry sterile dressings were placed over the incisions and around the chest tubes which were connected to pleurevac suction. The patient was then transported to the surgical intensive care unit in stable condition.

## 2023-09-03 NOTE — Anesthesia Procedure Notes (Signed)
Central Venous Catheter Insertion Performed by: Gaynelle Adu, MD, anesthesiologist Start/End9/08/2023 6:30 AM, 09/03/2023 6:45 AM Patient location: Pre-op. Preanesthetic checklist: patient identified, IV checked, site marked, risks and benefits discussed, surgical consent, monitors and equipment checked, pre-op evaluation, timeout performed and anesthesia consent Hand hygiene performed  and maximum sterile barriers used  PA cath was placed.Swan type:thermodilution PA Cath depth:50 Procedure performed without using ultrasound guided technique. Attempts: 1 Patient tolerated the procedure well with no immediate complications.

## 2023-09-03 NOTE — Discharge Summary (Signed)
Physician Discharge Summary  Patient ID: Jonathan [name] MRN: [id number] DOB/AGE: [birth year] 72 y.o.  Admit date: 09/03/2023 Discharge date: 09/03/2023  Admission Diagnoses:  Coronary artery disease Unstable angina pectoris Peripheral artery disease Dyslipidemia History of hypertension   Discharge Diagnoses:   Patient Active Problem List   Diagnosis Date Noted   S/P CABG x 2 09/03/2023   Unstable angina (HCC) 08/16/2023   Abnormal cardiovascular stress test 11/08/2022   Unstable angina pectoris (HCC) 11/08/2022   Mixed hyperlipidemia 11/24/2016   History of colonic polyps    Abdominal pain 11/15/2015   Lung nodule < 6cm on CT 11/15/2015   Lumbar stenosis with neurogenic claudication 03/18/2015   Low back pain 02/16/2015   HNP (herniated nucleus pulposus) with myelopathy, cervical 10/22/2014   Coronary artery disease involving native coronary artery of native heart with angina pectoris (HCC) 10/08/2014   S/P CABG x 1 10/08/2014   PAD (peripheral artery disease) (HCC) 10/08/2014   Essential hypertension 10/08/2014   GERD without esophagitis 10/08/2014   BPH (benign prostatic hyperplasia) 10/08/2014   Preoperative cardiovascular examination 10/08/2014   Discharged Condition: {condition:18240}  PCP is Assunta Found, MD Referring Provider is [name], MD  History of Present Illness:  This patient is a 72 year old gentleman with history  of coronary disease status post multiple percutaneous interventions on  the right coronary artery with stents, cutting balloon angioplasty,  brachytherapy, as well as stenting of the proximal LAD in 2003.  He  presented with crescendo anginal symptoms in 05/2008.  Cardiac catheterization  showed severe in-stent restenosis within the right coronary artery  proximally and in the midportion.  There was also a stent in the distal  right coronary artery up to the takeoff of the posterior descending  branch which had no significant  stenosis within that.  The posterior  descending and posterolateral branches were relatively small vessels.  The LAD stent had insignificant smooth less than 30% narrowing.  There  was insignificant left circumflex disease.  The left main was okay.  Left  ventricular function was normal. He then underwent CABG x 1 using a left radial artery graft to the PDA on 06/11/2008. He underwent PCI of an 80% ostial LCX in 10/2022 with a DES. He now presents with nitrate responsive chest pain consistent with unstable angina and cath shows severe ISR of the ostial LCX. The LAD had no significant stenosis with a patent stent. The RCA was chonically occluded with a patent radial artery graft to the PDA. This could not be opened with PCI after multiple attempts with multiple balloons rupturing. Shockwave lithotripsy was attempted but unsuccessful.    Since going home from his recent hospitalization he has had no chest discomfort.  He continues to be tired.  He denies any shortness of breath orthopnea.  This 72 year old gentleman has a high-grade in-stent restenosis within the proximal left circumflex that could not be opened with PCI. The LAD has no significant stenosis. The RCA is chronically occluded with a patent radial artery graft to the posterior descending branch. I think the best option is redo coronary bypass graft surgery to the left circumflex system using a left internal mammary graft if it is suitable and a vein graft if it is not. I reviewed the cardiac catheterization images with the patient and his wife and answered their questions.I discussed the operative procedure with the patient and his wife including alternatives, benefits and risks; including but not limited to bleeding, blood transfusion, infection, stroke, myocardial infarction,  graft failure, heart block requiring a permanent pacemaker, organ dysfunction, and death. Franne Grip understands and agrees to proceed.   Hospital Course: Mr. Bertot  was admitted for elective surgery on 09/03/23 and taken to the OR where re-do sternotomy was performed with single-vessel bypass grafting ( *** -OM).  Consults: {consultation:18241}  Significant Diagnostic Studies: angiography:    Ost RCA to Dist RCA lesion is 100% stenosed.   Ost Cx lesion is 90% stenosed.   Scoring balloon angioplasty was performed using a BALLN WOLVERINE 3.00X10.   Post intervention, there is a 90% residual stenosis.   Right radial artery and is normal in caliber.   The graft exhibits no disease.   There is no competitive flow   LV end diastolic pressure is mildly elevated.   2 vessel obstructive CAD. Severe in stent restenosis at the ostium of the LCx. Chronic occlusion of the RCA. Continued patency of the stent in the proximal LAD Patent free radial graft to the PDA Mildly elevated LVEDP 18 mm Hg Unsuccessful PCI of the ostium of the LCx. Unable to expand lesion due to the fact that multiple balloons ruptured in the lesion at low inflation pressures. Unsuccessful Shockwave lithotripsy.    Plan: PCI option very limited at this point. Could consider Rotational atherectomy which may possible smooth out whatever is causing balloon rupture. However, rotational atherectomy  is off label for in stent disease and even if lesion could eventually be expanded restenosis rate will be higher. The other option to consider would be CABG.   Treatments: surgery:   09/03/2023   Surgeon:  [name], MD   First Assistant: Jillyn Hidden,  PA-C:   An experienced assistant was required given the complexity of this surgery and the standard of surgical care. The assistant was needed for endoscopic vein harvest, exposure, dissection, suctioning, retraction of delicate tissues and sutures, instrument exchange and for overall help during this procedure.     Preoperative Diagnosis:  Severe multi-vessel coronary artery disease     Postoperative Diagnosis:  Same     Procedure:    Redo Median Sternotomy Extracorporeal circulation 3.   Redo Coronary artery bypass grafting x 2   Left internal mammary artery graft to the OM1 SVG to OM2     4.   Endoscopic vein harvest from the left leg  Discharge Exam: Blood pressure (!) 127/58, pulse 71, temperature 98.6 F (37 C), temperature source Oral, resp. rate 10, height 5\' 8"  (1.727 m), weight 79.4 kg, SpO2 97%. {physical ZOXW:9604540}  Disposition:  There are no questions and answers to display.         Allergies as of 09/03/2023   No Known Allergies   Med Rec must be completed prior to using this [facility]***        The patient has been discharged on:   1.Beta Blocker:  Yes [ x  ]                              No   [   ]                              If No, reason:  2.Ace Inhibitor/ARB: Yes [   ]  No  [    ]                                     If No, reason:  3.Statin:   Yes [ x ]                  No  [   ]                  If No, reason:  4.Ecasa:  Yes  [ x ]                  No   [   ]                  If No, reason:  5. ACS on Admission?  Unstable angina, patent coronary stents  P2Y12 Inhibitor:  Yes  [ x ]                                No  [  ]    Signed: Myron G. Roddenberry 09/03/2023, 8:35 AM

## 2023-09-03 NOTE — Progress Notes (Signed)
     [address]       [place] [address]             [phone]       EVENING ROUNDS  SP Redo CAB x 2 Hemodynamically with vasoplegia intra-op Stable currently. Not bleeding Still with high FiO2

## 2023-09-03 NOTE — Anesthesia Procedure Notes (Signed)
Central Venous Catheter Insertion Performed by: Gaynelle Adu, MD, anesthesiologist Start/End9/08/2023 6:30 AM, 09/03/2023 6:45 AM Patient location: Pre-op. Preanesthetic checklist: patient identified, IV checked, site marked, risks and benefits discussed, surgical consent, monitors and equipment checked, pre-op evaluation, timeout performed and anesthesia consent Position: Trendelenburg Lidocaine 1% used for infiltration and patient sedated Hand hygiene performed , maximum sterile barriers used  and Seldinger technique used Catheter size: 9 Fr Total catheter length 10. Central line was placed.MAC introducer Procedure performed using ultrasound guided technique. Ultrasound Notes:anatomy identified, needle tip was noted to be adjacent to the nerve/plexus identified, no ultrasound evidence of intravascular and/or intraneural injection and image(s) printed for medical record Attempts: 1 Following insertion, line sutured, dressing applied and Biopatch. Post procedure assessment: blood return through all ports, free fluid flow and no air  Patient tolerated the procedure well with no immediate complications.

## 2023-09-03 NOTE — Hospital Course (Addendum)
PCP is Assunta Found, MD Referring Provider is Rennis Golden Lisette Abu, MD  History of Present Illness:  This patient is a 72 year old gentleman with history  of coronary disease status post multiple percutaneous interventions on  the right coronary artery with stents, cutting balloon angioplasty,  brachytherapy, as well as stenting of the proximal LAD in 2003.  He  presented with crescendo anginal symptoms in 05/2008.  Cardiac catheterization  showed severe in-stent restenosis within the right coronary artery  proximally and in the midportion.  There was also a stent in the distal  right coronary artery up to the takeoff of the posterior descending  branch which had no significant stenosis within that.  The posterior  descending and posterolateral branches were relatively small vessels.  The LAD stent had insignificant smooth less than 30% narrowing.  There  was insignificant left circumflex disease.  The left main was okay.  Left  ventricular function was normal. He then underwent CABG x 1 using a left radial artery graft to the PDA on 06/11/2008. He underwent PCI of an 80% ostial LCX in 10/2022 with a DES. He now presents with nitrate responsive chest pain consistent with unstable angina and cath shows severe ISR of the ostial LCX. The LAD had no significant stenosis with a patent stent. The RCA was chonically occluded with a patent radial artery graft to the PDA. This could not be opened with PCI after multiple attempts with multiple balloons rupturing. Shockwave lithotripsy was attempted but unsuccessful.    Since going home from his recent hospitalization he has had no chest discomfort.  He continues to be tired.  He denies any shortness of breath orthopnea.  This 72 year old gentleman has a high-grade in-stent restenosis within the proximal left circumflex that could not be opened with PCI. The LAD has no significant stenosis. The RCA is chronically occluded with a patent radial artery graft to the  posterior descending branch. I think the best option is redo coronary bypass graft surgery to the left circumflex system using a left internal mammary graft if it is suitable and a vein graft if it is not. I reviewed the cardiac catheterization images with the patient and his wife and answered their questions.I discussed the operative procedure with the patient and his wife including alternatives, benefits and risks; including but not limited to bleeding, blood transfusion, infection, stroke, myocardial infarction, graft failure, heart block requiring a permanent pacemaker, organ dysfunction, and death. Franne Grip understands and agrees to proceed.   Hospital Course: Mr. Suite was admitted for elective surgery on 09/03/23 and taken to the OR where re-do sternotomy was performed with two-vessel bypass grafting (LIMA -OM1 and SVG-OM2).  Following the procedure he separated from cardiopulmonary bypass without any trouble and was later transferred to the surgical ICU. He was extubated the evening of surgery without difficulty. His chest tubes, arterial lines, and swan ganz catheter were removed without difficulty.  He had a mild elevation in his creatinine level.  This improved without intervention.  He had post operative thrombocytopenia felt to be related to Heparin use, thus Lovenox was not started.  He developed post operative Atrial Fibrillation and was treated with Amiodarone.  He had additional brief episodes requiring re-bolus of therapy.  He converted to Sinus tachycardia and was transitioned to oral therapy.  His lopressor dose was also titrated for additional HR control.  His pacing wires were removed without difficulty.  He was felt stable for transfer to the progressive care unit on 09/05/2023.  He was resumed on home regimen of Plavix prior to discharge.  He developed diarrhea with stool softner and laxative use, thus these were discontinued.  He was started on Demadex and potassium to treat his volume  overloaded state.  He continued to have episodes of paroxysmal Atrial Fibrillation.  He was started on Eliquis for stroke prophylaxis. He was hypertensive and home Telmisartan was started at half his home dose as discussed with Dr. Leafy Ro. He was instructed to keep a log of his blood pressure at home and hold Telmisartan for an SBP<100. He was ambulating on room air without difficulty and his incisions were healing well without sign of infection. The patient's weight was about 8lbs above preop weight, he was discharged on 5 days of Lasix. The patient was eager to go home. As discussed with Dr. Leafy Ro he was discharged in stable condition.

## 2023-09-03 NOTE — Anesthesia Postprocedure Evaluation (Signed)
Anesthesia Post Note  Patient: Jonathan [name]  Procedure(s) Performed: REDO CORONARY ARTERY BYPASS GRAFTING (CABG) x2, USING LEFT INTERNAL MAMMARY ARTERY,LEFT LEG GREATER SAPHENEOUS VEIN HARVESTED ENDOSCOPICLY TRANSESOPHAGEAL ECHOCARDIOGRAM     Patient location during evaluation: SICU Anesthesia Type: General Level of consciousness: sedated Pain management: pain level controlled Vital Signs Assessment: post-procedure vital signs reviewed and stable Respiratory status: patient remains intubated per anesthesia plan Cardiovascular status: stable Postop Assessment: no apparent nausea or vomiting Anesthetic complications: yes  Encounter Notable Events  Notable Event Outcome Phase Comment  Difficult to intubate - expected  Intraprocedure Filed from anesthesia note documentation.    Last Vitals:  Vitals:   09/03/23 1615 09/03/23 1630  BP:    Pulse: 80 80  Resp: 16 12  Temp: (!) 35.2 C (!) 35.1 C  SpO2: 99% 96%    Last Pain:  Vitals:   09/03/23 0624  TempSrc:   PainSc: 0-No pain                 Torria Fromer,W. EDMOND

## 2023-09-03 NOTE — Anesthesia Preprocedure Evaluation (Addendum)
Anesthesia Evaluation  Patient identified by MRN, date of birth, ID band Patient awake    Reviewed: Allergy & Precautions, H&P , NPO status , Patient's Chart, lab work & pertinent test results, reviewed documented beta blocker date and time   History of Anesthesia Complications (+) PONV and history of anesthetic complications  Airway Mallampati: III  TM Distance: >3 FB Neck ROM: Full    Dental no notable dental hx. (+) Teeth Intact, Dental Advisory Given   Pulmonary former smoker   Pulmonary exam normal breath sounds clear to auscultation       Cardiovascular hypertension, Pt. on medications and Pt. on home beta blockers + angina  + CAD, + Past MI, + CABG and + Peripheral Vascular Disease   Rhythm:Regular Rate:Normal     Neuro/Psych negative neurological ROS  negative psych ROS   GI/Hepatic Neg liver ROS, hiatal hernia,GERD  Medicated,,  Endo/Other  negative endocrine ROS    Renal/GU negative Renal ROS  negative genitourinary   Musculoskeletal   Abdominal   Peds  Hematology  (+) Blood dyscrasia, anemia   Anesthesia Other Findings   Reproductive/Obstetrics negative OB ROS                             Anesthesia Physical Anesthesia Plan  ASA: 4  Anesthesia Plan: General   Post-op Pain Management:    Induction: Intravenous  PONV Risk Score and Plan: 4 or greater and Ondansetron, Dexamethasone and Midazolam  Airway Management Planned: Oral ETT  Additional Equipment: Arterial line, CVP, PA Cath, TEE and Ultrasound Guidance Line Placement  Intra-op Plan:   Post-operative Plan: Post-operative intubation/ventilation  Informed Consent: I have reviewed the patients History and Physical, chart, labs and discussed the procedure including the risks, benefits and alternatives for the proposed anesthesia with the patient or authorized representative who has indicated his/her understanding  and acceptance.     Dental advisory given  Plan Discussed with: CRNA  Anesthesia Plan Comments:        Anesthesia Quick Evaluation

## 2023-09-03 NOTE — Transfer of Care (Signed)
Immediate Anesthesia Transfer of Care Note  Patient: Jonathan [name]  Procedure(s) Performed: REDO CORONARY ARTERY BYPASS GRAFTING (CABG) x2, USING LEFT INTERNAL MAMMARY ARTERY,LEFT LEG GREATER SAPHENEOUS VEIN HARVESTED ENDOSCOPICLY TRANSESOPHAGEAL ECHOCARDIOGRAM  Patient Location: ICU  Anesthesia Type:General  Level of Consciousness: sedated and Patient remains intubated per anesthesia plan  Airway & Oxygen Therapy: Patient remains intubated per anesthesia plan and Patient placed on Ventilator (see vital sign flow sheet for setting)  Post-op Assessment: Report given to RN and Post -op Vital signs reviewed and stable  Post vital signs: Reviewed and stable  Last Vitals:  Vitals Value Taken Time  BP 139/48 09/03/23  1453  Temp    Pulse 80 09/03/23  1453  Resp 16 09/03/23  1453  SpO2 99 09/03/23  1453    Last Pain:  Vitals:   09/03/23 0624  TempSrc:   PainSc: 0-No pain         Complications:  Encounter Notable Events  Notable Event Outcome Phase Comment  Difficult to intubate - expected  Intraprocedure Filed from anesthesia note documentation.

## 2023-09-03 NOTE — Telephone Encounter (Signed)
Pt was scheduled for an appointment with E. Monge, NP, today at 8:25am. Upon further review of chart, noted that pt is admitted at [facility] and scheduled for redo CABG today. NP appointment canceled.

## 2023-09-03 NOTE — Progress Notes (Signed)
Dr Laneta Simmers updated with most recent blood gas. Ok to wean to extubate.

## 2023-09-03 NOTE — Interval H&P Note (Signed)
History and Physical Interval Note:  09/03/2023 6:53 AM  Jonathan [name]  has presented today for surgery, with the diagnosis of CAD.  The various methods of treatment have been discussed with the patient and family. After consideration of risks, benefits and other options for treatment, the patient has consented to  Procedure(s): REDO CORONARY ARTERY BYPASS GRAFTING (CABG) (N/A) TRANSESOPHAGEAL ECHOCARDIOGRAM (N/A) as a surgical intervention.  The patient's history has been reviewed, patient examined, no change in status, stable for surgery.  I have reviewed the patient's chart and labs.  Questions were answered to the patient's satisfaction.     [name]

## 2023-09-03 NOTE — Anesthesia Procedure Notes (Signed)
Procedure Name: Intubation Date/Time: 09/03/2023 7:32 AM  Performed by: Owens Loffler, RNPre-anesthesia Checklist: Patient identified, Emergency Drugs available, Suction available and Patient being monitored Patient Re-evaluated:Patient Re-evaluated prior to induction Oxygen Delivery Method: Circle system utilized Preoxygenation: Pre-oxygenation with 100% oxygen Induction Type: IV induction Ventilation: Oral airway inserted - appropriate to patient size and Two handed mask ventilation required Laryngoscope Size: Glidescope and 4 Grade View: Grade I Tube type: Oral Tube size: 8.0 mm Number of attempts: 1 Airway Equipment and Method: Stylet and Oral airway Placement Confirmation: ETT inserted through vocal cords under direct vision, positive ETCO2 and breath sounds checked- equal and bilateral Secured at: 21 cm Tube secured with: Tape Dental Injury: Teeth and Oropharynx as per pre-operative assessment  Difficulty Due To: Difficulty was anticipated, Difficult Airway- due to limited oral opening and Difficult Airway- due to reduced neck mobility

## 2023-09-03 NOTE — H&P (Signed)
[address]       [place] [address]             [phone]      Cardiothoracic Surgery Admission History and Physical   PCP is Assunta Found, MD Referring Provider is [name], MD   Chief complaint: angina     HPI:    This patient is a 72 year old gentleman with history  of coronary disease status post multiple percutaneous interventions on  the right coronary artery with stents, cutting balloon angioplasty,  brachytherapy, as well as stenting of the proximal LAD in 2003.  He  presented with crescendo anginal symptoms in 05/2008.  Cardiac catheterization  showed severe in-stent restenosis within the right coronary artery  proximally and in the midportion.  There was also a stent in the distal  right coronary artery up to the takeoff of the posterior descending  branch which had no significant stenosis within that.  The posterior  descending and posterolateral branches were relatively small vessels.  The LAD stent had insignificant smooth less than 30% narrowing.  There  was insignificant left circumflex disease.  The left main was okay.  Left  ventricular function was normal. He then underwent CABG x 1 using a left radial artery graft to the PDA on 06/11/2008. He underwent PCI of an 80% ostial LCX in 10/2022 with a DES. He now presents with nitrate responsive chest pain consistent with unstable angina and cath shows severe ISR of the ostial LCX. The LAD had no significant stenosis with a patent stent. The RCA was chonically occluded with a patent radial artery graft to the PDA. This could not be opened with PCI after multiple attempts with multiple balloons rupturing. Shockwave lithotripsy was attempted but unsuccessful.    Since going home from his recent hospitalization he has had no chest discomfort.  He continues to be tired.  He denies any shortness of breath orthopnea.         Past Medical History:  Diagnosis Date   Anemia     CAD  (coronary artery disease)     Femoral artery stenosis (HCC)     Gastroesophageal reflux     H/O hiatal hernia     Hyperlipidemia     Hypertension     Myocardial infarction [facility]) 11/2001   PAD (peripheral artery disease) (HCC)      bilateral iliac artery stents    PONV (postoperative nausea and vomiting)      most of the time he gets nauseated   Prostate hyperplasia with urinary obstruction     S/P CABG x 1 2009    R radial to PDA   Tobacco consumption      quit cigarettes in 2008, smokes 5-6 cigars/week               Past Surgical History:  Procedure Laterality Date   ANTERIOR CERVICAL DECOMP/DISCECTOMY FUSION N/A 10/22/2014    Procedure: CERVICAL SIX SEVEN ANTERIOR CERVICAL DECOMPRESSION/DISCECTOMY FUSION  ;  Surgeon: Coletta Memos, MD;  Location: [facility] OR[facility];  Service: Neurosurgery;  Laterality: N/A;  C67 Possible C7T1 anterior cervical decompression with fusion plating and bonegraft   BACK SURGERY       CARDIAC CATHETERIZATION   12/03/2001    2.5x35mm S-660 stent to RCA (MI)   CARDIAC CATHETERIZATION   05/06/2002    PTCA & stenting of LAD and PTCA & stenting of mid/distal RCA with 3.0x39mm Zeta stent   CARDIAC  CATHETERIZATION   06/03/2002    LAD with prox 30% in-stent restenosis; ramus with 95% ostial lesion; L Cfx & 3-OMs free of disease; mild 30% RCA narrowing & 20-30% in-stent restenosis in RCA (Dr. Laurell Josephs)   CARDIAC CATHETERIZATION   03/24/2003    overlapping tandem DES 3.0x23 Cypher stent to mid RCA for in-stent restenosis & new lesion beyond previous stent with 3.0x12 SciMed TAXUS (Dr. Jonette Eva)   CARDIAC CATHETERIZATION   12/08/2003    3.0x24 TAXUS overlapping 3.0x70mm TAXUS DES in RCA and cuttin balloon atherectomy (Dr. Jonette Eva)   CARDIAC CATHETERIZATION   07/10/2007    normal L main, L Cfx & 3-OMs free of disease; LAD with Zeta stent in prox region (patent); RCA with multiple stents (3.0x12 Taxus in ostium with 3.0x20 Taxus overlapping it, and series of  3.0x24 Tacus and Cypher stent overlapping this, and in distal RC 2x5x52mm Zeta(?) stent; ostium of RCA with 80% narrowing - 3x15 cutting balloon - 80% to patient w/NO obstruction (Dr. Mervyn Skeeters. Little)   CARDIAC CATHETERIZATION   06/08/2008    high-grade osital & prox in-stent RCA restenosis - Dr. Jonette Eva   COLONOSCOPY N/A 11/22/2015    Procedure: COLONOSCOPY;  Surgeon: Corbin Ade, MD;  Location: [facility];  Service: Endoscopy;  Laterality: N/A;  100   CORONARY ARTERY BYPASS GRAFT   2009    free right radial to PDA for progressive RCA disease & multiple re-stenosis (Dr. Wayland Salinas)   CORONARY BALLOON ANGIOPLASTY N/A 08/17/2023    Procedure: CORONARY BALLOON ANGIOPLASTY;  Surgeon: Swaziland, Peter M, MD;  Location: [facility] [place] INVASIVE CV LAB;  Service: Cardiovascular;  Laterality: N/A;   CORONARY STENT INTERVENTION N/A 11/08/2022    Procedure: CORONARY STENT INTERVENTION;  Surgeon: Marykay Lex, MD;  Location: [facility] INVASIVE CV LAB;  Service: Cardiovascular;  Laterality: N/A;   CORONARY ULTRASOUND/IVUS N/A 08/17/2023    Procedure: Coronary Ultrasound/IVUS;  Surgeon: Swaziland, Peter M, MD;  Location: [facility] INVASIVE CV LAB;  Service: Cardiovascular;  Laterality: N/A;   ILIAC ARTERY STENT Bilateral 01/30/2002    9x41mm Cordis SmartStent to LCIA, 9x3.75mm Genesis stent to RCIA (Dr. Erlene Quan)   LEFT HEART CATH AND CORS/GRAFTS ANGIOGRAPHY N/A 11/08/2022    Procedure: LEFT HEART CATH AND CORS/GRAFTS ANGIOGRAPHY;  Surgeon: Marykay Lex, MD;  Location: [facility] INVASIVE CV LAB;  Service: Cardiovascular;  Laterality: N/A;   LEFT HEART CATH AND CORS/GRAFTS ANGIOGRAPHY N/A 08/17/2023    Procedure: LEFT HEART CATH AND CORS/GRAFTS ANGIOGRAPHY;  Surgeon: Swaziland, Peter M, MD;  Location: [facility] INVASIVE CV LAB;  Service: Cardiovascular;  Laterality: N/A;   Lower Extremity Arterial Doppler   2009    R CIA stent - less than 50% diameter reduction; R CFA stent - 0-49% diameter reduction; R SFA less than 50% diameter reduction; L CFA,  profunda, SFA - equal/less than 50% diameter reduction   LUMBAR LAMINECTOMY/DECOMPRESSION MICRODISCECTOMY Bilateral 03/18/2015    Procedure: LUMBAR LAMINECTOMY/DECOMPRESSION MICRODISCECTOMY 2 LEVELS;  Surgeon: Coletta Memos, MD;  Location: [facility];  Service: Neurosurgery;  Laterality: Bilateral;  Bilateral L34 L45 laminectomies   NM MYOCAR PERF WALL MOTION   07/2013    bruce myoview - intermediate risk test with abnormal inferior horizontal ST depression; mild, fixed inferobasal defect, but no ischemia   TRANSTHORACIC ECHOCARDIOGRAM   07/2013    EF 55-60%, mild LVH; mild MR; LA mildly dilated; RVSP increased; PA peak pressure (mod pulm HTN)  Family History  Problem Relation Age of Onset   Heart disease Father     CAD Brother          CABG   CAD Brother          CABG   Colon cancer Neg Hx            Social History Social History  Social History         Tobacco Use   Smoking status: Former      Current packs/day: 0.00      Average packs/day: 0.2 packs/day for 30.0 years (6.0 ttl pk-yrs)      Types: Cigarettes      Start date: 08/23/1992      Quit date: 08/23/2022      Years since quitting: 0.9   Smokeless tobacco: Never   Tobacco comments:      1 pk per week - cigarettes  Substance Use Topics   Alcohol use: Yes      Alcohol/week: 12.0 standard drinks of alcohol      Types: 12 Cans of beer per week      Comment: beer 2-3 times per week   Drug use: No      Types: Marijuana              Current Outpatient Medications  Medication Sig Dispense Refill   aspirin EC 81 MG tablet Take 1 tablet (81 mg total) by mouth daily. Swallow whole. 30 tablet 5   clopidogrel (PLAVIX) 75 MG tablet Take 1 tablet (75 mg total) by mouth daily. 30 tablet 11   ezetimibe (ZETIA) 10 MG tablet Take 1 tablet (10 mg total) by mouth daily. Take 1 tablet by mouth daily. 90 tablet 3   fenofibrate (TRICOR) 145 MG tablet Take 1 tablet (145 mg total) by mouth daily. 90 tablet 3    isosorbide mononitrate (IMDUR) 120 MG 24 hr tablet Take 1 tablet (120 mg total) by mouth daily. 30 tablet 3   metoprolol succinate (TOPROL-XL) 50 MG 24 hr tablet TAKE 1 TABLET BY MOUTH EVERY DAY WITH OR IMMEDIATELY FOLLOWING A MEAL. 90 tablet 3   nitroGLYCERIN (NITROSTAT) 0.4 MG SL tablet Place 1 tablet (0.4 mg total) under the tongue every 5 (five) minutes as needed for chest pain. 25 tablet 5   pantoprazole (PROTONIX) 40 MG tablet Take 1 tablet (40 mg total) by mouth daily. 30 tablet 1   ranolazine (RANEXA) 1000 MG SR tablet Take 1 tablet (1,000 mg total) by mouth 2 (two) times daily. 180 tablet 3   rosuvastatin (CRESTOR) 10 MG tablet Take 1 tablet (10 mg total) by mouth daily. 90 tablet 3   tamsulosin (FLOMAX) 0.4 MG CAPS capsule Take 1 capsule (0.4 mg total) by mouth daily after supper.       telmisartan (MICARDIS) 80 MG tablet Take 1 tablet (80 mg total) by mouth daily. 90 tablet 1      No current facility-administered medications for this visit.        Allergies  No Known Allergies     Review of Systems  Constitutional:  Positive for fatigue.  HENT:  Positive for dental problem.        Needs to have a tooth extracted  Eyes: Negative.   Respiratory:  Negative for shortness of breath.   Cardiovascular:  Positive for chest pain. Negative for palpitations and leg swelling.       But none since discharge.  Gastrointestinal: Negative.   Endocrine: Negative.  Genitourinary: Negative.   Musculoskeletal: Negative.   Allergic/Immunologic: Negative.   Neurological:  Negative for dizziness and syncope.  Hematological: Negative.   Psychiatric/Behavioral: Negative.        There were no vitals taken for this visit. Physical Exam Constitutional:      Appearance: Normal appearance. He is normal weight.  HENT:     Head: Normocephalic and atraumatic.  Eyes:     Extraocular Movements: Extraocular movements intact.     Conjunctiva/sclera: Conjunctivae normal.     Pupils: Pupils are  equal, round, and reactive to light.  Cardiovascular:     Rate and Rhythm: Normal rate and regular rhythm.     Heart sounds: Normal heart sounds. No murmur heard. Pulmonary:     Effort: Pulmonary effort is normal.     Breath sounds: Normal breath sounds.  Abdominal:     General: Bowel sounds are normal. There is no distension.     Palpations: Abdomen is soft.     Tenderness: There is no abdominal tenderness.  Musculoskeletal:        General: No swelling.  Skin:    General: Skin is warm and dry.  Neurological:     General: No focal deficit present.     Mental Status: He is alert and oriented to person, place, and time.  Psychiatric:        Mood and Affect: Mood normal.        Behavior: Behavior normal.          Diagnostic Tests:   Physicians   Panel Physicians Referring Physician Case Authorizing Physician  [place], Peter M, MD (Primary)        Procedures   CORONARY BALLOON ANGIOPLASTY  Coronary Ultrasound/IVUS  LEFT HEART CATH AND CORS/GRAFTS ANGIOGRAPHY    Conclusion       Ost RCA to Dist RCA lesion is 100% stenosed.   Ost Cx lesion is 90% stenosed.   Scoring balloon angioplasty was performed using a BALLN WOLVERINE 3.00X10.   Post intervention, there is a 90% residual stenosis.   Right radial artery and is normal in caliber.   The graft exhibits no disease.   There is no competitive flow   LV end diastolic pressure is mildly elevated.   2 vessel obstructive CAD. Severe in stent restenosis at the ostium of the LCx. Chronic occlusion of the RCA. Continued patency of the stent in the proximal LAD Patent free radial graft to the PDA Mildly elevated LVEDP 18 mm Hg Unsuccessful PCI of the ostium of the LCx. Unable to expand lesion due to the fact that multiple balloons ruptured in the lesion at low inflation pressures. Unsuccessful Shockwave lithotripsy.    Plan: PCI option very limited at this point. Could consider Rotational atherectomy which may possible smooth  out whatever is causing balloon rupture. However, rotational atherectomy  is off label for in stent disease and even if lesion could eventually be expanded restenosis rate will be higher. The other option to consider would be CABG.    Indications   Unstable angina (HCC) [I20.0 (ICD-10-CM)]    Procedural Details   Technical Details Indication: 72 yo WM with history of multiple PCIs in the past - mostly in the RCA with recurrent restenosis and subsequent CABG x 1 with radial artery to the RCA. Remote stent of the proximal LAD. S/p PCI with stenting of the ostial LCx in November 2023. Presents now with USAP  Procedural Details:  The left wrist was prepped, draped, and anesthetized with  1% lidocaine. Using the modified Seldinger technique, a 6 French slender sheath was introduced into the left radial artery. 3 mg of verapamil was administered through the sheath, weight-based unfractionated heparin was administered intravenously. Standard Judkins catheters were used for selective coronary angiography and left ventricular pressures. Catheter exchanges were performed over an exchange length guidewire.  PCI Note:  Following the diagnostic procedure, the decision was made to proceed with PCI.  Weight-based heparin was given for anticoagulation. Once a therapeutic ACT was achieved, a 6 [place] EBU 3.5 guide catheter was inserted.  A prowater coronary guidewire was used to cross the lesion. With crossing with the wire the patient began having severe chest pain.   The lesion was predilated with a 2.0 mm balloon. He was given IC Ntg and chest pain improved. We then performed IVUS of the LCx. This demonstrated excellent size and expansion of the LCx in the distal half of the stent. Proximally there was severe narrowing. The ostium did appear to be adequately covered.  We then attempted to dilate the lesion with a 3.0 mm Wolverine balloon but it ruptured at 6 atm. We also attempted 3.25 x 8, 3.25 x 12 and 2.5 x 12 mm [place]  balloons but they all ruptured at low inflation pressures < 6 atm. Finally we attempted to treat with IC lithotripsy with a 3.0 x 12 mm Shockwave balloon but this balloon ruptured at 2 atm. We did deliver 4 pulse sequence but the balloon was never fully inflated. At this point we had exhausted treatment options. There was persistent severe 90% stenosis at the LCx ostium. The patient did have severe chest pain with each balloon inflation but was pain free at the end of the case. There were no immediate procedural complications. A TR band was used for radial hemostasis. The patient was transferred to the post catheterization recovery area for further monitoring. Contrast: 130 cc     Estimated blood loss <50 mL.   During this procedure medications were administered to achieve and maintain moderate conscious sedation while the patient's heart rate, blood pressure, and oxygen saturation were continuously monitored and I was present face-to-face 100% of this time.    Medications (Filter: Administrations occurring from 1404 to 1616 on 08/17/23)  important  Continuous medications are totaled by the amount administered until 08/17/23 1616.    Heparin (Porcine) in NaCl 1000-0.9 UT/500ML-% SOLN (mL)  Total volume: 1,500 mL Date/Time Rate/Dose/Volume Action    08/17/23 1410 500 mL Given    1410 500 mL Given    1530 500 mL Given    midazolam (VERSED) injection (mg)  Total dose: 3 mg Date/Time Rate/Dose/Volume Action    08/17/23 1418 2 mg Given    1504 1 mg Given    fentaNYL (SUBLIMAZE) injection (mcg)  Total dose: 150 mcg Date/Time Rate/Dose/Volume Action    08/17/23 1418 25 mcg Given    1447 50 mcg Given    1504 25 mcg Given    1538 50 mcg Given    0.9 %  sodium chloride infusion (mL/hr)  Total dose: Cannot be calculated* *Continuous medication not stopped within the calculation time range. Date/Time Rate/Dose/Volume Action    08/17/23 1418 50 mL/hr New Bag/Given    lidocaine (PF) (XYLOCAINE)  1 % injection (mL)  Total volume: 2 mL Date/Time Rate/Dose/Volume Action    08/17/23 1426 2 mL Given    Radial Cocktail/Verapamil only (mL)  Total volume: 10 mL Date/Time Rate/Dose/Volume Action    08/17/23 1429 10 mL  Given    heparin sodium (porcine) injection (Units)  Total dose: 13,000 Units Date/Time Rate/Dose/Volume Action    08/17/23 1432 4,000 Units Given    1438 4,000 Units Given    1448 3,000 Units Given    1501 2,000 Units Given    nitroGLYCERIN 1 mg/10 mL (100 mcg/mL) - IR/CATH LAB (mcg)  Total dose: 400 mcg Date/Time Rate/Dose/Volume Action    08/17/23 1446 200 mcg Given    1538 200 mcg Given    hydrALAZINE (APRESOLINE) injection (mg)  Total dose: 10 mg Date/Time Rate/Dose/Volume Action    08/17/23 1551 10 mg Given    iohexol (OMNIPAQUE) 350 MG/ML injection (mL)  Total volume: 130 mL Date/Time Rate/Dose/Volume Action    08/17/23 1551 130 mL Given    acetaminophen (TYLENOL) tablet 650 mg (mg)  Total dose: Cannot be calculated* Dosing weight: 80.7 *Administration dose not documented Date/Time Rate/Dose/Volume Action    08/17/23 1404 *Not included in total MAR Hold    aspirin EC tablet 81 mg (mg)  Total dose: Cannot be calculated* Dosing weight: 80.7 *Administration dose not documented Date/Time Rate/Dose/Volume Action    08/17/23 1404 *Not included in total MAR Hold    clopidogrel (PLAVIX) tablet 75 mg (mg)  Total dose: Cannot be calculated* Dosing weight: 80.7 *Administration dose not documented Date/Time Rate/Dose/Volume Action    08/17/23 1404 *Not included in total MAR Hold    ezetimibe (ZETIA) tablet 10 mg (mg)  Total dose: Cannot be calculated* Dosing weight: 80.7 *Administration dose not documented Date/Time Rate/Dose/Volume Action    08/17/23 1404 *Not included in total MAR Hold    fenofibrate tablet 160 mg (mg)  Total dose: Cannot be calculated* Dosing weight: 80.7 *Administration dose not documented Date/Time Rate/Dose/Volume Action    08/17/23  1404 *Not included in total MAR Hold    heparin injection 5,000 Units (Units)  Total dose: Cannot be calculated* Dosing weight: 80.7 *Administration dose not documented Date/Time Rate/Dose/Volume Action    08/17/23 1404 *Not included in total MAR Hold    irbesartan (AVAPRO) tablet 300 mg (mg)  Total dose: Cannot be calculated* Dosing weight: 80.7 *Administration dose not documented Date/Time Rate/Dose/Volume Action    08/17/23 1404 *Not included in total MAR Hold    metoprolol succinate (TOPROL-XL) 24 hr tablet 50 mg (mg)  Total dose: Cannot be calculated* *Administration dose not documented Date/Time Rate/Dose/Volume Action    08/17/23 1404 *Not included in total MAR Hold    nitroGLYCERIN 50 mg in dextrose 5 % 250 mL (0.2 mg/mL) infusion (mcg/min)  Total dose: Cannot be calculated* Dosing weight: 80.5 *Administration dose not documented Date/Time Rate/Dose/Volume Action    08/17/23 1404 *Not included in total MAR Hold    ondansetron (ZOFRAN) injection 4 mg (mg)  Total dose: Cannot be calculated* Dosing weight: 80.7 *Administration dose not documented Date/Time Rate/Dose/Volume Action    08/17/23 1404 *Not included in total MAR Hold    pantoprazole (PROTONIX) EC tablet 40 mg (mg)  Total dose: Cannot be calculated* Dosing weight: 80.7 *Administration dose not documented Date/Time Rate/Dose/Volume Action    08/17/23 1404 *Not included in total MAR Hold    rosuvastatin (CRESTOR) tablet 10 mg (mg)  Total dose: Cannot be calculated* *Administration dose not documented Date/Time Rate/Dose/Volume Action    08/17/23 1404 *Not included in total MAR Hold    tamsulosin (FLOMAX) capsule 0.4 mg (mg)  Total dose: Cannot be calculated* *Administration dose not documented Date/Time Rate/Dose/Volume Action    08/17/23 1404 *Not included in total MAR Hold  Sedation Time   Sedation Time Physician-1: 1 hour 24 minutes 19 seconds Contrast        Administrations occurring from 1404 to  1616 on 08/17/23:  Medication Name Total Dose  iohexol (OMNIPAQUE) 350 MG/ML injection 130 mL    Radiation/Fluoro   Fluoro time: 11.1 (min) DAP: 25186 (mGycm2) Cumulative Air Kerma: 453 (mGy) Complications   Complications documented before study signed (08/17/2023  4:16 PM)    No complications were associated with this study.  Documented by Lucius Conn, RT - 08/17/2023  3:44 PM      Coronary Findings   Diagnostic Dominance: Right Left Main  Vessel is normal in caliber.    Left Anterior Descending  The vessel exhibits minimal luminal irregularities.    First Diagonal Branch  Vessel is small in size.    Second Diagonal Branch  Vessel is small in size.    Third Diagonal Branch  Vessel is small in size.    Ramus Intermedius  Vessel is small.    Left Circumflex  Vessel is moderate in size. There is mild diffuse disease throughout the vessel.  Ost Cx lesion is 90% stenosed. Vessel is the culprit lesion. The lesion is located at the bifurcation and eccentric. Fibrotic The lesion was previously treated using a drug eluting stent between 6-12 months ago.    Second Obtuse Marginal Branch  Vessel is small in size.    Left Posterior Atrioventricular Artery  Vessel is small in size.    Right Coronary Artery  Vessel is normal in caliber. There is severe diffuse disease throughout the vessel.  Ost RCA to Dist RCA lesion is 100% stenosed. The lesion was previously treated using a bare metal stent and a drug eluting stent over 2 years ago.    Right Posterior Descending Artery  Vessel is small in size.    Right Posterior Atrioventricular Artery  Vessel is small in size.    First Right Posterolateral Branch  Vessel is small in size.    Second Right Posterolateral Branch  Vessel is small in size.    Right Radial Artery Graft To RPDA  Right radial artery and is normal in caliber. The graft exhibits no disease. There is no competitive flow    Intervention    Ost  Cx lesion  Angioplasty  CATH LAUNCHER 6FR EBU3.5 guide catheter was inserted. WIRE ASAHI PROWATER 180CM guidewire used to cross lesion. Scoring balloon angioplasty was performed using a BALLN WOLVERINE 3.00X10.  Post-Intervention Lesion Assessment  The intervention was unsuccessful due to lesion rigidity. Pre-interventional TIMI flow is 3. Post-intervention TIMI flow is 3. No complications occurred at this lesion.  There is a 90% residual stenosis post intervention.      Left Heart   Left Ventricle LV end diastolic pressure is mildly elevated.    Coronary Diagrams   Diagnostic Dominance: Right  Intervention     Implants    No implant documentation for this case.    Syngo Images    Show images for CARDIAC CATHETERIZATION Images on Long Term Storage    Show images for Klaus, [place] to Procedure Log   Procedure Log    Link to Procedure Log   Procedure Log    Hemo Data   Flowsheet Row Most Recent Value  AO Systolic Pressure 186 mmHg  AO Diastolic Pressure 74 mmHg  AO Mean 122 mmHg  LV Systolic Pressure 180 mmHg  LV Diastolic Pressure 2 mmHg  LV EDP 13 mmHg  AOp  Systolic Pressure 184 mmHg  AOp Diastolic Pressure 67 mmHg  AOp Mean Pressure 116 mmHg  LVp Systolic Pressure 181 mmHg  LVp Diastolic Pressure -3 mmHg  LVp EDP Pressure 9 mmHg      Impression:   This 72 year old gentleman has a high-grade in-stent restenosis within the proximal left circumflex that could not be opened with PCI.  The LAD has no significant stenosis.  The RCA is chronically occluded with a patent radial artery graft to the posterior descending branch.  I think the best option is redo coronary bypass graft surgery to the left circumflex system using a left internal mammary graft if it is suitable and a vein graft if it is not.  I reviewed the cardiac catheterization images with the patient and his wife and answered their questions.I discussed the operative procedure with the patient and  his wife including alternatives, benefits and risks; including but not limited to bleeding, blood transfusion, infection, stroke, myocardial infarction, graft failure, heart block requiring a permanent pacemaker, organ dysfunction, and death.  Franne Grip understands and agrees to proceed.      Plan:   Redo coronary bypass graft surgery.    [name], MD [facility] [phone]

## 2023-09-04 ENCOUNTER — Inpatient Hospital Stay (HOSPITAL_COMMUNITY): Payer: PPO

## 2023-09-04 ENCOUNTER — Other Ambulatory Visit: Payer: Self-pay

## 2023-09-04 ENCOUNTER — Encounter (HOSPITAL_COMMUNITY): Payer: Self-pay | Admitting: Surgery

## 2023-09-04 LAB — CBC
HCT: 34.7 % — ABNORMAL LOW (ref 39.0–52.0)
HCT: 33.4 % — ABNORMAL LOW (ref 39.0–52.0)
Hemoglobin: 12 g/dL — ABNORMAL LOW (ref 13.0–17.0)
Hemoglobin: 11.5 g/dL — ABNORMAL LOW (ref 13.0–17.0)
MCH: 29.6 pg (ref 26.0–34.0)
MCH: 30.6 pg (ref 26.0–34.0)
MCHC: 34.6 g/dL (ref 30.0–36.0)
MCHC: 34.4 g/dL (ref 30.0–36.0)
MCV: 85.5 fL (ref 80.0–100.0)
MCV: 88.8 fL (ref 80.0–100.0)
Platelets: 82 10*3/uL — ABNORMAL LOW (ref 150–400)
Platelets: 82 10*3/uL — ABNORMAL LOW (ref 150–400)
RBC: 4.06 MIL/uL — ABNORMAL LOW (ref 4.22–5.81)
RBC: 3.76 MIL/uL — ABNORMAL LOW (ref 4.22–5.81)
RDW: 14.7 % (ref 11.5–15.5)
RDW: 15.2 % (ref 11.5–15.5)
WBC: 9.9 10*3/uL (ref 4.0–10.5)
WBC: 9.4 10*3/uL (ref 4.0–10.5)
nRBC: 0 % (ref 0.0–0.2)
nRBC: 0 % (ref 0.0–0.2)

## 2023-09-04 LAB — TYPE AND SCREEN
ABO/RH(D): O POS
Antibody Screen: NEGATIVE
Unit division: 0
Unit division: 0
Unit division: 0
Unit division: 0

## 2023-09-04 LAB — BPAM RBC
Blood Product Expiration Date: 202410022359
Blood Product Expiration Date: 202410022359
Blood Product Expiration Date: 202410042359
Blood Product Expiration Date: 202410062359
ISSUE DATE / TIME: 202409090707
ISSUE DATE / TIME: 202409090707
ISSUE DATE / TIME: 202409090707
ISSUE DATE / TIME: 202409101045
Unit Type and Rh: 5100
Unit Type and Rh: 5100
Unit Type and Rh: 5100
Unit Type and Rh: 5100

## 2023-09-04 LAB — GLUCOSE, CAPILLARY
Glucose-Capillary: 105 mg/dL — ABNORMAL HIGH (ref 70–99)
Glucose-Capillary: 107 mg/dL — ABNORMAL HIGH (ref 70–99)
Glucose-Capillary: 109 mg/dL — ABNORMAL HIGH (ref 70–99)
Glucose-Capillary: 111 mg/dL — ABNORMAL HIGH (ref 70–99)
Glucose-Capillary: 119 mg/dL — ABNORMAL HIGH (ref 70–99)
Glucose-Capillary: 120 mg/dL — ABNORMAL HIGH (ref 70–99)
Glucose-Capillary: 122 mg/dL — ABNORMAL HIGH (ref 70–99)
Glucose-Capillary: 126 mg/dL — ABNORMAL HIGH (ref 70–99)
Glucose-Capillary: 127 mg/dL — ABNORMAL HIGH (ref 70–99)
Glucose-Capillary: 131 mg/dL — ABNORMAL HIGH (ref 70–99)
Glucose-Capillary: 131 mg/dL — ABNORMAL HIGH (ref 70–99)
Glucose-Capillary: 133 mg/dL — ABNORMAL HIGH (ref 70–99)
Glucose-Capillary: 136 mg/dL — ABNORMAL HIGH (ref 70–99)
Glucose-Capillary: 91 mg/dL (ref 70–99)

## 2023-09-04 LAB — BASIC METABOLIC PANEL
Anion gap: 7 (ref 5–15)
Anion gap: 7 (ref 5–15)
BUN: 18 mg/dL (ref 8–23)
BUN: 19 mg/dL (ref 8–23)
CO2: 23 mmol/L (ref 22–32)
CO2: 27 mmol/L (ref 22–32)
Calcium: 7.4 mg/dL — ABNORMAL LOW (ref 8.9–10.3)
Calcium: 7.8 mg/dL — ABNORMAL LOW (ref 8.9–10.3)
Chloride: 106 mmol/L (ref 98–111)
Chloride: 103 mmol/L (ref 98–111)
Creatinine, Ser: 1.47 mg/dL — ABNORMAL HIGH (ref 0.61–1.24)
Creatinine, Ser: 1.44 mg/dL — ABNORMAL HIGH (ref 0.61–1.24)
GFR, Estimated: 50 mL/min — ABNORMAL LOW (ref >60)
GFR, Estimated: 52 mL/min — ABNORMAL LOW (ref >60)
Glucose, Bld: 123 mg/dL — ABNORMAL HIGH (ref 70–99)
Glucose, Bld: 126 mg/dL — ABNORMAL HIGH (ref 70–99)
Potassium: 4.5 mmol/L (ref 3.5–5.1)
Potassium: 4.3 mmol/L (ref 3.5–5.1)
Sodium: 136 mmol/L (ref 135–145)
Sodium: 137 mmol/L (ref 135–145)

## 2023-09-04 LAB — MAGNESIUM
Magnesium: 2.4 mg/dL (ref 1.7–2.4)
Magnesium: 2.4 mg/dL (ref 1.7–2.4)

## 2023-09-04 MED ORDER — INSULIN ASPART 100 UNIT/ML IJ SOLN
0.0000 [IU] | INTRAMUSCULAR | Status: DC
  Administered 2023-09-04 (×2): 2 [IU] via SUBCUTANEOUS

## 2023-09-04 MED ORDER — AMIODARONE LOAD VIA INFUSION
150.0000 mg | Freq: Once | INTRAVENOUS | Status: AC
  Administered 2023-09-04: 150 mg via INTRAVENOUS
  Filled 2023-09-04: qty 83.34

## 2023-09-04 MED ORDER — FUROSEMIDE 10 MG/ML IJ SOLN
40.0000 mg | Freq: Once | INTRAMUSCULAR | Status: AC
  Administered 2023-09-04: 40 mg via INTRAVENOUS
  Filled 2023-09-04: qty 4

## 2023-09-04 MED ORDER — AMIODARONE HCL IN DEXTROSE 360-4.14 MG/200ML-% IV SOLN
60.0000 mg/h | INTRAVENOUS | Status: DC
  Administered 2023-09-04 – 2023-09-05 (×2): 60 mg/h via INTRAVENOUS
  Filled 2023-09-04 (×2): qty 200

## 2023-09-04 MED ORDER — AMIODARONE HCL IN DEXTROSE 360-4.14 MG/200ML-% IV SOLN: 30.0000 mg/h | INTRAVENOUS | Status: DC

## 2023-09-04 MED FILL — Magnesium Sulfate Inj 50%: INTRAMUSCULAR | Qty: 10 | Status: AC

## 2023-09-04 MED FILL — Heparin Sodium (Porcine) Inj 1000 Unit/ML: Qty: 1000 | Status: AC

## 2023-09-04 MED FILL — Potassium Chloride Inj 2 mEq/ML: INTRAVENOUS | Qty: 40 | Status: AC

## 2023-09-04 NOTE — Plan of Care (Signed)
  Problem: Education: Goal: Individualized Educational Video(s) Outcome: Progressing   Problem: Activity: Goal: Ability to tolerate increased activity will improve Outcome: Progressing   Problem: Cardiac: Goal: Ability to achieve and maintain adequate cardiovascular perfusion will improve Outcome: Progressing   Problem: Health Behavior/Discharge Planning: Goal: Ability to safely manage health-related needs after discharge will improve Outcome: Progressing

## 2023-09-04 NOTE — Progress Notes (Signed)
1 Day Post-Op Procedure(s) (LRB): REDO CORONARY ARTERY BYPASS GRAFTING (CABG) x2, USING LEFT INTERNAL MAMMARY ARTERY,LEFT LEG GREATER SAPHENEOUS VEIN HARVESTED ENDOSCOPICLY (N/A) TRANSESOPHAGEAL ECHOCARDIOGRAM (N/A) Subjective: No complaints. Stable night.  Objective: Vital signs in last 24 hours: Temp:  [95.2 F (35.1 C)-99.7 F (37.6 C)] 98.1 F (36.7 C) (09/10 0600) Pulse Rate:  [69-80] 69 (09/10 0600) Cardiac Rhythm: Heart block (09/10 0422) Resp:  [2-24] 2 (09/10 0600) BP: (100-131)/(42-71) 112/42 (09/10 0600) SpO2:  [93 %-99 %] 95 % (09/10 0600) Arterial Line BP: (84-189)/(35-76) 124/44 (09/10 0600) FiO2 (%):  [40 %-100 %] 40 % (09/09 2040) Weight:  [90.5 kg] 90.5 kg (09/10 0500)  Hemodynamic parameters for last 24 hours: PAP: (14-33)/(5-22) 25/15 CO:  [3.7 L/min-6.4 L/min] 6.4 L/min CI:  [1.92 L/min/m2-3.32 L/min/m2] 3.32 L/min/m2  Intake/Output from previous day: 09/09 0701 - 09/10 0700 In: 7113.3 [I.V.:3293.2; Blood:840; NG/GT:30; IV Piggyback:2950.1] Out: 5005 [Urine:1990; Blood:2655; Chest Tube:360] Intake/Output this shift: Total I/O In: 616.2 [I.V.:241.1; NG/GT:30; IV Piggyback:345.1] Out: 790 [Urine:540; Chest Tube:250]  General appearance: alert and cooperative Neurologic: intact Heart: regular rate and rhythm, S1, S2 normal, no murmur Lungs: clear to auscultation bilaterally Extremities: mild edema Wound: dressings dry  Lab Results: Recent Labs    09/03/23 2117 09/03/23 2324 09/04/23 0427  WBC 7.9  --  9.9  HGB 12.5* 11.6* 12.0*  HCT 36.2* 34.0* 34.7*  PLT 86*  --  82*   BMET:  Recent Labs    09/03/23 2117 09/03/23 2324 09/04/23 0427  NA 137 139 136  K 4.6 4.4 4.5  CL 106  --  106  CO2 23  --  23  GLUCOSE 128*  --  123*  BUN 16  --  18  CREATININE 1.47*  --  1.47*  CALCIUM 7.3*  --  7.4*    PT/INR:  Recent Labs    09/03/23 1512  LABPROT 21.4*  INR 1.8*   ABG    Component Value Date/Time   PHART 7.272 (L) 09/03/2023 2324    HCO3 22.4 09/03/2023 2324   TCO2 24 09/03/2023 2324   ACIDBASEDEF 5.0 (H) 09/03/2023 2324   O2SAT 93 09/03/2023 2324   CBG (last 3)  Recent Labs    09/04/23 0130 09/04/23 0230 09/04/23 0437  GLUCAP 122* 131* 127*   CXR: pulmonary vascular congestion  Assessment/Plan: S/P Procedure(s) (LRB): REDO CORONARY ARTERY BYPASS GRAFTING (CABG) x2, USING LEFT INTERNAL MAMMARY ARTERY,LEFT LEG GREATER SAPHENEOUS VEIN HARVESTED ENDOSCOPICLY (N/A) TRANSESOPHAGEAL ECHOCARDIOGRAM (N/A)  POD 1 Hemodynamically stable off vasopressors. Resume low dose Lopressor.  DC chest tubes, swan, arterial line.  Volume excess: wt is 25 lbs over preop if accurate which I doubt. Will hold off on diuresis this am since creat bumped slightly. Likely related to mild preop elevation and periop hypotension, ARB use. Preop creat was mildly elevated at 1.29, 1.22 post cath and 1.09 pre cath.  IS, OOB, ambulate.  Mild thrombocytopenia likely related to heparin. Will hold off on Lovenox and follow.   Continue ASA and will resume Plavix once pacing wires out.   LOS: 1 day    [name] 09/04/2023

## 2023-09-04 NOTE — TOC Initial Note (Signed)
Transition of Care [facility]) - Initial/Assessment Note    Patient Details  Name: Jonathan [name] MRN: [id number] Date of Birth: 1951/01/19  Transition of Care [facility]) CM/SW Contact:    Gala Lewandowsky, RN Phone Number: 09/04/2023, 2:29 PM  Clinical Narrative:  Patient was discussed in progression rounds-POD-1 CABG.  PTA patient was independent from home with spouse. Family at the bedside during visit. Patient does not have any DME in the home. Patient has insurance, PCP and has no issues obtaining medications. Case Manager will continue to follow for transition of care needs as the patient progresses.                Expected Discharge Plan: Home w Home Health Services Barriers to Discharge: Continued Medical Work up   Patient Goals and CMS Choice Patient states their goals for this hospitalization and ongoing recovery are:: plan to return home once stable.   Choice offered to / list presented to : NA      Expected Discharge Plan and Services In-house Referral: NA Discharge Planning Services: CM Consult   Living arrangements for the past 2 months: Single Family Home                   DME Agency: NA   Prior Living Arrangements/Services Living arrangements for the past 2 months: Single Family Home Lives with:: Spouse Patient language and need for interpreter reviewed:: Yes Do you feel safe going back to the place where you live?: Yes      Need for Family Participation in Patient Care: Yes (Comment) Care giver support system in place?: Yes (comment)   Criminal Activity/Legal Involvement Pertinent to Current Situation/Hospitalization: No - Comment as needed  Permission Sought/Granted Permission sought to share information with : Case Manager, Family Supports     Emotional Assessment Appearance:: Appears stated age Attitude/Demeanor/Rapport: Engaged Affect (typically observed): Appropriate Orientation: : Oriented to Self, Oriented to Place, Oriented to  Time,  Oriented to Situation Alcohol / Substance Use: Not Applicable Psych Involvement: No (comment)  Admission diagnosis:  S/P CABG x 2 [Z95.1] Patient Active Problem List   Diagnosis Date Noted   S/P CABG x 2 09/03/2023   Unstable angina (HCC) 08/16/2023   Abnormal cardiovascular stress test 11/08/2022   Unstable angina pectoris (HCC) 11/08/2022   Mixed hyperlipidemia 11/24/2016   History of colonic polyps    Abdominal pain 11/15/2015   Lung nodule < 6cm on CT 11/15/2015   Lumbar stenosis with neurogenic claudication 03/18/2015   Low back pain 02/16/2015   HNP (herniated nucleus pulposus) with myelopathy, cervical 10/22/2014   Coronary artery disease involving native coronary artery of native heart with angina pectoris (HCC) 10/08/2014   S/P CABG x 1 10/08/2014   PAD (peripheral artery disease) (HCC) 10/08/2014   Essential hypertension 10/08/2014   GERD without esophagitis 10/08/2014   BPH (benign prostatic hyperplasia) 10/08/2014   Preoperative cardiovascular examination 10/08/2014   PCP:  Assunta Found, MD Pharmacy:   [facility] - [place], [place] - [id number] [place] [address] Phone: [phone] Fax: [phone]  [facility] [place] - [phone] [facility] [address] [place] [address] Phone: [phone] Fax: [phone]  [facility] [address] [place] [address] Phone: [phone] Fax: [phone]  [facility] - [place], [place] - [place] [address] [place] [address] Phone: [phone] Fax: [phone]  Social  Determinants of Health (SDOH) Social History: SDOH Screenings   Food Insecurity: No Food Insecurity (08/16/2023)  Housing: Low Risk  (08/16/2023)  Transportation Needs: No Transportation Needs (08/16/2023)  Utilities: Not At Risk (08/16/2023)  Tobacco Use: Medium Risk (09/03/2023)     Readmission Risk Interventions     No data to display

## 2023-09-04 NOTE — Progress Notes (Signed)
EVENING ROUNDS NOTE :     [address]       [place] [address]             [phone]                 1 Day Post-Op Procedure(s) (LRB): REDO CORONARY ARTERY BYPASS GRAFTING (CABG) x2, USING LEFT INTERNAL MAMMARY ARTERY,LEFT LEG GREATER SAPHENEOUS VEIN HARVESTED ENDOSCOPICLY (N/A) TRANSESOPHAGEAL ECHOCARDIOGRAM (N/A)   Total Length of Stay:  LOS: 1 day  Events:   No events Up to chair    BP (!) 118/45   Pulse 75   Temp 97.8 F (36.6 C) (Oral)   Resp (!) 7   Ht 5\' 8"  (1.727 m)   Wt 90.5 kg   SpO2 95%   BMI 30.34 kg/m   PAP: (21-33)/(13-22) 31/19 CO:  [4.4 L/min-6.4 L/min] 6.4 L/min CI:  [2.29 L/min/m2-3.32 L/min/m2] 3.3 L/min/m2  Vent Mode: PSV;CPAP FiO2 (%):  [40 %-50 %] 40 % Set Rate:  [4 bmp-24 bmp] 4 bmp Vt Set:  [540 mL] 540 mL PEEP:  [5 cmH20] 5 cmH20 Pressure Support:  [5 cmH20-10 cmH20] 5 cmH20   sodium chloride      ceFAZolin (ANCEF) IV Stopped (09/04/23 1815)   lactated ringers     lactated ringers 10 mL/hr at 09/04/23 1900   nitroGLYCERIN      I/O last 3 completed shifts: In: 7859.4 [P.O.:420; I.V.:3419.5; Blood:840; NG/GT:30; IV Piggyback:3150] Out: 6265 [Urine:3250; Blood:2655; Chest Tube:360]      Latest Ref Rng & Units 09/04/2023    3:45 PM 09/04/2023    4:27 AM 09/03/2023   11:24 PM  CBC  WBC 4.0 - 10.5 K/uL 9.4  9.9    Hemoglobin 13.0 - 17.0 g/dL 29.5  18.8  41.6   Hematocrit 39.0 - 52.0 % 33.4  34.7  34.0   Platelets 150 - 400 K/uL 82  82         Latest Ref Rng & Units 09/04/2023    3:45 PM 09/04/2023    4:27 AM 09/03/2023   11:24 PM  BMP  Glucose 70 - 99 mg/dL 606  301    BUN 8 - 23 mg/dL 19  18    Creatinine 6.01 - 1.24 mg/dL 0.93  2.35    Sodium 573 - 145 mmol/L 137  136  139   Potassium 3.5 - 5.1 mmol/L 4.3  4.5  4.4   Chloride 98 - 111 mmol/L 103  106    CO2 22 - 32 mmol/L 27  23    Calcium 8.9 - 10.3 mg/dL 7.8  7.4      ABG    Component Value Date/Time   PHART 7.272 (L) 09/03/2023 2324   PCO2ART  48.3 (H) 09/03/2023 2324   PO2ART 74 (L) 09/03/2023 2324   HCO3 22.4 09/03/2023 2324   TCO2 24 09/03/2023 2324   ACIDBASEDEF 5.0 (H) 09/03/2023 2324   O2SAT 93 09/03/2023 2324       Brynda Greathouse, MD 09/04/2023 7:20 PM

## 2023-09-04 NOTE — Plan of Care (Signed)
  Problem: Clinical Measurements: Goal: Respiratory complications will improve Outcome: Progressing   Problem: Cardiovascular: Goal: Ability to achieve and maintain adequate cardiovascular perfusion will improve Outcome: Progressing   Problem: Clinical Measurements: Goal: Cardiovascular complication will be avoided Outcome: Progressing   Problem: Clinical Measurements: Goal: Diagnostic test results will improve Outcome: Progressing   Problem: Urinary Elimination: Goal: Ability to achieve and maintain adequate renal perfusion and functioning will improve Outcome: Progressing

## 2023-09-05 ENCOUNTER — Inpatient Hospital Stay (HOSPITAL_COMMUNITY): Payer: PPO

## 2023-09-05 LAB — CBC
HCT: 31.3 % — ABNORMAL LOW (ref 39.0–52.0)
Hemoglobin: 10.4 g/dL — ABNORMAL LOW (ref 13.0–17.0)
MCH: 29.5 pg (ref 26.0–34.0)
MCHC: 33.2 g/dL (ref 30.0–36.0)
MCV: 88.7 fL (ref 80.0–100.0)
Platelets: 73 10*3/uL — ABNORMAL LOW (ref 150–400)
RBC: 3.53 MIL/uL — ABNORMAL LOW (ref 4.22–5.81)
RDW: 15.2 % (ref 11.5–15.5)
WBC: 7.2 10*3/uL (ref 4.0–10.5)
nRBC: 0 % (ref 0.0–0.2)

## 2023-09-05 LAB — BASIC METABOLIC PANEL
Anion gap: 6 (ref 5–15)
BUN: 19 mg/dL (ref 8–23)
CO2: 28 mmol/L (ref 22–32)
Calcium: 7.7 mg/dL — ABNORMAL LOW (ref 8.9–10.3)
Chloride: 100 mmol/L (ref 98–111)
Creatinine, Ser: 1.21 mg/dL (ref 0.61–1.24)
GFR, Estimated: >60 mL/min (ref >60)
Glucose, Bld: 126 mg/dL — ABNORMAL HIGH (ref 70–99)
Potassium: 4 mmol/L (ref 3.5–5.1)
Sodium: 134 mmol/L — ABNORMAL LOW (ref 135–145)

## 2023-09-05 LAB — GLUCOSE, CAPILLARY
Glucose-Capillary: 117 mg/dL — ABNORMAL HIGH (ref 70–99)
Glucose-Capillary: 175 mg/dL — ABNORMAL HIGH (ref 70–99)
Glucose-Capillary: 116 mg/dL — ABNORMAL HIGH (ref 70–99)
Glucose-Capillary: 100 mg/dL — ABNORMAL HIGH (ref 70–99)

## 2023-09-05 LAB — SURGICAL PATHOLOGY

## 2023-09-05 MED ORDER — TRAMADOL HCL 50 MG PO TABS: 50.0000 mg | ORAL_TABLET | ORAL | Status: DC | PRN

## 2023-09-05 MED ORDER — POTASSIUM CHLORIDE CRYS ER 20 MEQ PO TBCR
20.0000 meq | EXTENDED_RELEASE_TABLET | Freq: Three times a day (TID) | ORAL | Status: AC
  Administered 2023-09-05 (×3): 20 meq via ORAL
  Filled 2023-09-05 (×3): qty 1

## 2023-09-05 MED ORDER — ASPIRIN 81 MG PO TBEC
81.0000 mg | DELAYED_RELEASE_TABLET | Freq: Every day | ORAL | Status: DC
  Administered 2023-09-05: 81 mg via ORAL
  Filled 2023-09-05: qty 1

## 2023-09-05 MED ORDER — ASPIRIN 81 MG PO CHEW: 81.0000 mg | CHEWABLE_TABLET | Freq: Every day | ORAL | Status: DC

## 2023-09-05 MED ORDER — BISACODYL 5 MG PO TBEC
10.0000 mg | DELAYED_RELEASE_TABLET | Freq: Every day | ORAL | Status: DC | PRN
  Administered 2023-09-05: 10 mg via ORAL
  Filled 2023-09-05: qty 2

## 2023-09-05 MED ORDER — AMIODARONE HCL 200 MG PO TABS
400.0000 mg | ORAL_TABLET | Freq: Two times a day (BID) | ORAL | Status: DC
  Administered 2023-09-05 – 2023-09-08 (×7): 400 mg via ORAL
  Filled 2023-09-05 (×7): qty 2

## 2023-09-05 MED ORDER — METOPROLOL TARTRATE 12.5 MG HALF TABLET
12.5000 mg | ORAL_TABLET | Freq: Two times a day (BID) | ORAL | Status: DC
  Administered 2023-09-05: 12.5 mg via ORAL
  Filled 2023-09-05: qty 1

## 2023-09-05 MED ORDER — POTASSIUM CHLORIDE CRYS ER 20 MEQ PO TBCR
20.0000 meq | EXTENDED_RELEASE_TABLET | Freq: Two times a day (BID) | ORAL | Status: DC
  Administered 2023-09-06 – 2023-09-08 (×5): 20 meq via ORAL
  Filled 2023-09-05 (×5): qty 1

## 2023-09-05 MED ORDER — AMIODARONE IV BOLUS ONLY 150 MG/100ML
INTRAVENOUS | Status: AC
  Filled 2023-09-05: qty 100

## 2023-09-05 MED ORDER — SODIUM CHLORIDE 0.9% FLUSH: 3.0000 mL | INTRAVENOUS | Status: DC | PRN

## 2023-09-05 MED ORDER — ONDANSETRON HCL 4 MG PO TABS
4.0000 mg | ORAL_TABLET | Freq: Four times a day (QID) | ORAL | Status: DC | PRN
  Administered 2023-09-06: 4 mg via ORAL
  Filled 2023-09-05: qty 1

## 2023-09-05 MED ORDER — DOCUSATE SODIUM 100 MG PO CAPS: 200.0000 mg | ORAL_CAPSULE | Freq: Every day | ORAL | Status: DC

## 2023-09-05 MED ORDER — OXYCODONE HCL 5 MG PO TABS: 5.0000 mg | ORAL_TABLET | ORAL | Status: DC | PRN

## 2023-09-05 MED ORDER — ~~LOC~~ CARDIAC SURGERY, PATIENT & FAMILY EDUCATION: Freq: Once | Status: AC

## 2023-09-05 MED ORDER — ASPIRIN 81 MG PO TBEC
81.0000 mg | DELAYED_RELEASE_TABLET | Freq: Every day | ORAL | Status: DC
  Administered 2023-09-06 – 2023-09-08 (×3): 81 mg via ORAL
  Filled 2023-09-05 (×3): qty 1

## 2023-09-05 MED ORDER — BISACODYL 10 MG RE SUPP: 10.0000 mg | Freq: Every day | RECTAL | Status: DC | PRN

## 2023-09-05 MED ORDER — SODIUM CHLORIDE 0.9 % IV SOLN: 250.0000 mL | INTRAVENOUS | Status: DC | PRN

## 2023-09-05 MED ORDER — FUROSEMIDE 10 MG/ML IJ SOLN
40.0000 mg | Freq: Once | INTRAMUSCULAR | Status: AC
  Administered 2023-09-05: 40 mg via INTRAVENOUS
  Filled 2023-09-05: qty 4

## 2023-09-05 MED ORDER — SODIUM CHLORIDE 0.9% FLUSH
3.0000 mL | Freq: Two times a day (BID) | INTRAVENOUS | Status: DC
  Administered 2023-09-05 – 2023-09-07 (×4): 3 mL via INTRAVENOUS

## 2023-09-05 MED ORDER — ONDANSETRON HCL 4 MG/2ML IJ SOLN: 4.0000 mg | Freq: Four times a day (QID) | INTRAMUSCULAR | Status: DC | PRN

## 2023-09-05 MED ORDER — CLOPIDOGREL BISULFATE 75 MG PO TABS
75.0000 mg | ORAL_TABLET | Freq: Every day | ORAL | Status: DC
  Administered 2023-09-06: 75 mg via ORAL
  Filled 2023-09-05: qty 1

## 2023-09-05 NOTE — Progress Notes (Signed)
Patient ID: Jonathan [name], male   DOB: 06-16-1951, 72 y.o.   MRN: [id number] TCTS Evening Rounds:  Hemodynamically stable in sinus rhythm on oral amio.  Pacing wires and sleeve out today.  Ambulated.   Diuresing well.   Awaiting bed on 4E.

## 2023-09-05 NOTE — Progress Notes (Signed)
2 Days Post-Op Procedure(s) (LRB): REDO CORONARY ARTERY BYPASS GRAFTING (CABG) x2, USING LEFT INTERNAL MAMMARY ARTERY,LEFT LEG GREATER SAPHENEOUS VEIN HARVESTED ENDOSCOPICLY (N/A) TRANSESOPHAGEAL ECHOCARDIOGRAM (N/A) Subjective:  No complaints.   Went into atrial fib with RVR last pm and converted with IV amio.  Ambulated this am.  Objective: Vital signs in last 24 hours: Temp:  [97.8 F (36.6 C)-99 F (37.2 C)] 98.8 F (37.1 C) (09/11 0530) Pulse Rate:  [67-142] 75 (09/11 0745) Cardiac Rhythm: Normal sinus rhythm (09/11 0400) Resp:  [4-25] 9 (09/11 0745) BP: (99-150)/(43-127) 116/67 (09/11 0700) SpO2:  [89 %-99 %] 95 % (09/11 0745) Arterial Line BP: (145-149)/(48-52) 149/48 (09/10 0900) Weight:  [88.9 kg] 88.9 kg (09/11 0500)  Hemodynamic parameters for last 24 hours: PAP: (31)/(19) 31/19 CO:  [6.4 L/min] 6.4 L/min CI:  [3.3 L/min/m2] 3.3 L/min/m2  Intake/Output from previous day: 09/10 0701 - 09/11 0700 In: 1235.7 [P.O.:360; I.V.:575.9; IV Piggyback:299.9] Out: 1885 [Urine:1885] Intake/Output this shift: No intake/output data recorded.  General appearance: alert and cooperative Neurologic: intact Heart: regular rate and rhythm Lungs: clear to auscultation bilaterally Extremities: edema mild Wound: dressing dry  Lab Results: Recent Labs    09/04/23 1545 09/05/23 0330  WBC 9.4 7.2  HGB 11.5* 10.4*  HCT 33.4* 31.3*  PLT 82* 73*   BMET:  Recent Labs    09/04/23 1545 09/05/23 0330  NA 137 134*  K 4.3 4.0  CL 103 100  CO2 27 28  GLUCOSE 126* 126*  BUN 19 19  CREATININE 1.44* 1.21  CALCIUM 7.8* 7.7*    PT/INR:  Recent Labs    09/03/23 1512  LABPROT 21.4*  INR 1.8*   ABG    Component Value Date/Time   PHART 7.272 (L) 09/03/2023 2324   HCO3 22.4 09/03/2023 2324   TCO2 24 09/03/2023 2324   ACIDBASEDEF 5.0 (H) 09/03/2023 2324   O2SAT 93 09/03/2023 2324   CBG (last 3)  Recent Labs    09/04/23 1953 09/04/23 2317 09/05/23 0335  GLUCAP 119*  105* 117*   CXR: ok  Assessment/Plan: S/P Procedure(s) (LRB): REDO CORONARY ARTERY BYPASS GRAFTING (CABG) x2, USING LEFT INTERNAL MAMMARY ARTERY,LEFT LEG GREATER SAPHENEOUS VEIN HARVESTED ENDOSCOPICLY (N/A) TRANSESOPHAGEAL ECHOCARDIOGRAM (N/A)  POD 2 Hemodynamically stable in sinus rhythm. Continue lopressor and amio.  Postop atrial fib converted with IV amio. Will switch to po this am.  Volume excess: -649 cc yesterday. Wt down 5 lbs. Still 20 lbs over preop. Continue diuresis and KCL replacement.   DC pacing wires this am and then sleeve 2 hrs later if stable.  Then transfer to 4E.  Continue IS, ambulation.  Decrease ASA to 81 mg. Resume Plavix tomorrow.  Thrombocytopenia: likely related to heparin. Doubt HIT given time course. Follow.   LOS: 2 days    [name] 09/05/2023

## 2023-09-06 ENCOUNTER — Other Ambulatory Visit: Payer: Self-pay | Admitting: Internal Medicine

## 2023-09-06 DIAGNOSIS — I2510 Atherosclerotic heart disease of native coronary artery without angina pectoris: Secondary | ICD-10-CM

## 2023-09-06 LAB — CBC
HCT: 30.7 % — ABNORMAL LOW (ref 39.0–52.0)
Hemoglobin: 10.5 g/dL — ABNORMAL LOW (ref 13.0–17.0)
MCH: 30.4 pg (ref 26.0–34.0)
MCHC: 34.2 g/dL (ref 30.0–36.0)
MCV: 89 fL (ref 80.0–100.0)
Platelets: 77 10*3/uL — ABNORMAL LOW (ref 150–400)
RBC: 3.45 MIL/uL — ABNORMAL LOW (ref 4.22–5.81)
RDW: 14.7 % (ref 11.5–15.5)
WBC: 7.1 10*3/uL (ref 4.0–10.5)
nRBC: 0 % (ref 0.0–0.2)

## 2023-09-06 LAB — BASIC METABOLIC PANEL
Anion gap: 8 (ref 5–15)
BUN: 18 mg/dL (ref 8–23)
CO2: 26 mmol/L (ref 22–32)
Calcium: 8.2 mg/dL — ABNORMAL LOW (ref 8.9–10.3)
Chloride: 101 mmol/L (ref 98–111)
Creatinine, Ser: 1.09 mg/dL (ref 0.61–1.24)
GFR, Estimated: >60 mL/min (ref >60)
Glucose, Bld: 113 mg/dL — ABNORMAL HIGH (ref 70–99)
Potassium: 4.2 mmol/L (ref 3.5–5.1)
Sodium: 135 mmol/L (ref 135–145)

## 2023-09-06 MED ORDER — AMIODARONE IV BOLUS ONLY 150 MG/100ML
150.0000 mg | Freq: Once | INTRAVENOUS | Status: AC
  Administered 2023-09-06: 150 mg via INTRAVENOUS
  Filled 2023-09-06: qty 100

## 2023-09-06 MED ORDER — POTASSIUM CHLORIDE CRYS ER 20 MEQ PO TBCR: 20.0000 meq | EXTENDED_RELEASE_TABLET | Freq: Two times a day (BID) | ORAL | Status: DC

## 2023-09-06 MED ORDER — TORSEMIDE 20 MG PO TABS
40.0000 mg | ORAL_TABLET | Freq: Every day | ORAL | Status: AC
  Administered 2023-09-06 – 2023-09-08 (×3): 40 mg via ORAL
  Filled 2023-09-06 (×3): qty 2

## 2023-09-06 MED ORDER — METOPROLOL TARTRATE 25 MG PO TABS
25.0000 mg | ORAL_TABLET | Freq: Once | ORAL | Status: AC
  Administered 2023-09-06: 25 mg via ORAL
  Filled 2023-09-06: qty 1

## 2023-09-06 MED ORDER — METOPROLOL TARTRATE 25 MG PO TABS
25.0000 mg | ORAL_TABLET | Freq: Two times a day (BID) | ORAL | Status: DC
  Administered 2023-09-06 (×2): 25 mg via ORAL
  Filled 2023-09-06 (×2): qty 1

## 2023-09-06 NOTE — Progress Notes (Signed)
3 Days Post-Op Procedure(s) (LRB): REDO CORONARY ARTERY BYPASS GRAFTING (CABG) x2, USING LEFT INTERNAL MAMMARY ARTERY,LEFT LEG GREATER SAPHENEOUS VEIN HARVESTED ENDOSCOPICLY (N/A) TRANSESOPHAGEAL ECHOCARDIOGRAM (N/A) Subjective: Having frequent stools overnight after laxatives.  Had some atrial fib overnight but back in sinus this am. I gave him an additional bolus of amio last night.  Objective: Vital signs in last 24 hours: Temp:  [98.2 F (36.8 C)-99.7 F (37.6 C)] 98.5 F (36.9 C) (09/12 0345) Pulse Rate:  [66-99] 82 (09/11 2255) Cardiac Rhythm: Normal sinus rhythm (09/12 0200) Resp:  [7-27] 20 (09/12 0345) BP: (117-162)/(43-72) 155/59 (09/12 0345) SpO2:  [93 %-98 %] 96 % (09/12 0345) Weight:  [88.1 kg] 88.1 kg (09/11 2100)  Hemodynamic parameters for last 24 hours:    Intake/Output from previous day: 09/11 0701 - 09/12 0700 In: 429.9 [P.O.:240; I.V.:90; IV Piggyback:99.9] Out: 1870 [Urine:1870] Intake/Output this shift: No intake/output data recorded.  General appearance: alert and cooperative Neurologic: intact Heart: regular rate and rhythm Lungs: clear to auscultation bilaterally Extremities: edema mild Wound: incision ok  Lab Results: Recent Labs    09/05/23 0330 09/06/23 0440  WBC 7.2 7.1  HGB 10.4* 10.5*  HCT 31.3* 30.7*  PLT 73* 77*   BMET:  Recent Labs    09/05/23 0330 09/06/23 0440  NA 134* 135  K 4.0 4.2  CL 100 101  CO2 28 26  GLUCOSE 126* 113*  BUN 19 18  CREATININE 1.21 1.09  CALCIUM 7.7* 8.2*    PT/INR:  Recent Labs    09/03/23 1512  LABPROT 21.4*  INR 1.8*   ABG    Component Value Date/Time   PHART 7.272 (L) 09/03/2023 2324   HCO3 22.4 09/03/2023 2324   TCO2 24 09/03/2023 2324   ACIDBASEDEF 5.0 (H) 09/03/2023 2324   O2SAT 93 09/03/2023 2324   CBG (last 3)  Recent Labs    09/05/23 0756 09/05/23 1115 09/05/23 1550  GLUCAP 175* 116* 100*    Assessment/Plan: S/P Procedure(s) (LRB): REDO CORONARY ARTERY BYPASS  GRAFTING (CABG) x2, USING LEFT INTERNAL MAMMARY ARTERY,LEFT LEG GREATER SAPHENEOUS VEIN HARVESTED ENDOSCOPICLY (N/A) TRANSESOPHAGEAL ECHOCARDIOGRAM (N/A)  POD 3  Hemodynamically stable in sinus tach 104 this am. Will increase Lopressor to 25 bid since BP ok.  Postop atrial fib. Continue amio 400 bid. May need additional boluses. If it continues to recur may need to send home on Eliquis but for now will continue ASA and Plavix.  Volume excess: wt pending this am. Continue Torsemide and Kdur.  IS, ambulation  DC laxatives and stool softeners.   LOS: 3 days    [name] 09/06/2023

## 2023-09-06 NOTE — Progress Notes (Signed)
CARDIAC REHAB PHASE I   PRE:  Rate/Rhythm: 83 NSR  BP:  Sitting: 129/62      SaO2: 94 RA  MODE:  Ambulation: 420 ft   AD:   RW  POST:  Rate/Rhythm: 95 NSR  BP:  Sitting: 151/52      SaO2: 96 RA  Pt able to transfer from lying position to sitting up with verbal cues and minimal contact assist. Pt stood with no assistance while following sternal precautions and ambulated the hallway with standby assist. Pt able to tx back into bed without assistance.   Faustino Congress  ACSM-CEP 12:03 PM 09/06/2023    Service time is from 1136 to 1156.

## 2023-09-06 NOTE — Progress Notes (Addendum)
Pt had afib with rvr with the rate of 140s sustained for 20 min. Pt asymptomatic.  Paged MD on call (dr. Dorris Fetch). Received verbal order for Metoprolol 25 mg po once and amio drip bolus once.  Will continue to monitor the pt.   Lawson Radar, RN

## 2023-09-06 NOTE — Plan of Care (Signed)
  Problem: Education: Goal: Understanding of cardiac disease, CV risk reduction, and recovery process will improve Outcome: Progressing   Problem: Activity: Goal: Ability to tolerate increased activity will improve Outcome: Progressing   Problem: Health Behavior/Discharge Planning: Goal: Ability to safely manage health-related needs after discharge will improve Outcome: Progressing   Problem: Education: Goal: Understanding of CV disease, CV risk reduction, and recovery process will improve Outcome: Progressing   Problem: Activity: Goal: Ability to return to baseline activity level will improve Outcome: Progressing   Problem: Cardiovascular: Goal: Ability to achieve and maintain adequate cardiovascular perfusion will improve Outcome: Progressing   Problem: Activity: Goal: Risk for activity intolerance will decrease Outcome: Progressing   Problem: Elimination: Goal: Will not experience complications related to bowel motility Outcome: Progressing   Problem: Education: Goal: Will demonstrate proper wound care and an understanding of methods to prevent future damage Outcome: Progressing Goal: Knowledge of disease or condition will improve Outcome: Progressing   Problem: Cardiac: Goal: Will achieve and/or maintain hemodynamic stability Outcome: Progressing   Problem: Skin Integrity: Goal: Wound healing without signs and symptoms of infection Outcome: Progressing Goal: Risk for impaired skin integrity will decrease Outcome: Progressing

## 2023-09-06 NOTE — Progress Notes (Signed)
Mobility Specialist Progress Note:   09/06/23 1435  Mobility  Activity Ambulated with assistance in hallway  Level of Assistance Contact guard assist, steadying assist  Assistive Device Front wheel walker  Distance Ambulated (ft) 290 ft  RUE Weight Bearing NWB  LUE Weight Bearing NWB  Activity Response Tolerated well  Mobility Referral Yes  $Mobility charge 1 Mobility  Mobility Specialist Start Time (ACUTE ONLY) 1405  Mobility Specialist Stop Time (ACUTE ONLY) 1415  Mobility Specialist Time Calculation (min) (ACUTE ONLY) 10 min   Pt received in chair, agreeable to mobility. SB to stand. CG during ambulation. C/o slight leg weakness, otherwise asymptomatic throughout. Pt left in chair with call bell in reach and all needs met.  Leory Plowman  Mobility Specialist Please contact via [employer] office at [phone]

## 2023-09-06 NOTE — Progress Notes (Signed)
Received call from CCMD notified that pt's HR=140s. Obtained EKG. EKG confirmed pt had afib with RVR. PA notified. Amio drip bolus received. Pt stable. Will continue to montior the pt.  Lawson Radar, RN

## 2023-09-07 ENCOUNTER — Other Ambulatory Visit (HOSPITAL_COMMUNITY): Payer: Self-pay

## 2023-09-07 MED ORDER — METOPROLOL TARTRATE 50 MG PO TABS
50.0000 mg | ORAL_TABLET | Freq: Two times a day (BID) | ORAL | Status: DC
  Administered 2023-09-07 – 2023-09-08 (×3): 50 mg via ORAL
  Filled 2023-09-07 (×3): qty 1

## 2023-09-07 MED ORDER — APIXABAN 5 MG PO TABS
5.0000 mg | ORAL_TABLET | Freq: Two times a day (BID) | ORAL | Status: DC
  Administered 2023-09-07 – 2023-09-08 (×3): 5 mg via ORAL
  Filled 2023-09-07 (×3): qty 1

## 2023-09-07 NOTE — Progress Notes (Signed)
ANTICOAGULATION CONSULT NOTE - Initial Consult  Pharmacy Consult for apixaban Indication: atrial fibrillation  No Known Allergies  Patient Measurements: Height: 5\' 8"  (172.7 cm) Weight: 84.5 kg (186 lb 3.2 oz) IBW/kg (Calculated) : 68.4   Vital Signs: Temp: 98.2 F (36.8 C) (09/13 0358) Temp Source: Oral (09/13 0358) BP: 151/62 (09/13 0358) Pulse Rate: 79 (09/13 0500)  Labs: Recent Labs    09/04/23 1545 09/05/23 0330 09/06/23 0440  HGB 11.5* 10.4* 10.5*  HCT 33.4* 31.3* 30.7*  PLT 82* 73* 77*  CREATININE 1.44* 1.21 1.09    Estimated Creatinine Clearance: 64.8 mL/min (by C-G formula based on SCr of 1.09 mg/dL).   Medical History: Past Medical History:  Diagnosis Date   Anemia    CAD (coronary artery disease)    Femoral artery stenosis (HCC)    Gastroesophageal reflux    H/O hiatal hernia    Hyperlipidemia    Hypertension    Myocardial infarction (HCC) 11/24/2001   Neuromuscular disorder (HCC)    Bilateral Leg Neuropathy   PAD (peripheral artery disease) (HCC)    bilateral iliac artery stents    PONV (postoperative nausea and vomiting)    most of the time he gets nauseated   Prostate hyperplasia with urinary obstruction    S/P CABG x 1 12/26/2007   R radial to PDA   Tobacco consumption    quit cigarettes in 2008, smokes 5-6 cigars/week    Medications:  Medications Prior to Admission  Medication Sig Dispense Refill Last Dose   aspirin EC 81 MG tablet Take 1 tablet (81 mg total) by mouth daily. Swallow whole. 30 tablet 5 09/02/2023   ezetimibe (ZETIA) 10 MG tablet Take 1 tablet (10 mg total) by mouth daily. Take 1 tablet by mouth daily. 90 tablet 3 09/03/2023 at 0445   fenofibrate (TRICOR) 145 MG tablet Take 1 tablet (145 mg total) by mouth daily. 90 tablet 3 09/03/2023 at 0445   isosorbide mononitrate (IMDUR) 120 MG 24 hr tablet Take 1 tablet (120 mg total) by mouth daily. 30 tablet 3 09/03/2023 at 0445   metoprolol succinate (TOPROL-XL) 50 MG 24 hr tablet TAKE  1 TABLET BY MOUTH EVERY DAY WITH OR IMMEDIATELY FOLLOWING A MEAL. 90 tablet 3 09/03/2023 at 0445   nitroGLYCERIN (NITROSTAT) 0.4 MG SL tablet Place 1 tablet (0.4 mg total) under the tongue every 5 (five) minutes as needed for chest pain. 25 tablet 5 Past Month   pantoprazole (PROTONIX) 40 MG tablet Take 1 tablet (40 mg total) by mouth daily. 30 tablet 1 09/03/2023 at 0445   ranolazine (RANEXA) 1000 MG SR tablet Take 1 tablet (1,000 mg total) by mouth 2 (two) times daily. 180 tablet 3 09/03/2023 at 0445   rosuvastatin (CRESTOR) 10 MG tablet Take 1 tablet (10 mg total) by mouth daily. 90 tablet 3 09/03/2023 at 0445   tamsulosin (FLOMAX) 0.4 MG CAPS capsule Take 1 capsule (0.4 mg total) by mouth daily after supper.   Past Week   telmisartan (MICARDIS) 80 MG tablet Take 1 tablet (80 mg total) by mouth daily. 90 tablet 1 09/02/2023   clopidogrel (PLAVIX) 75 MG tablet Take 1 tablet (75 mg total) by mouth daily. 30 tablet 11 08/28/2023   Scheduled:   acetaminophen  1,000 mg Oral Q6H   Or   acetaminophen (TYLENOL) oral liquid 160 mg/5 mL  1,000 mg Per Tube Q6H   amiodarone  400 mg Oral BID   aspirin EC  81 mg Oral Daily   metoprolol tartrate  50 mg Oral BID   pantoprazole  40 mg Oral Daily   potassium chloride  20 mEq Oral BID   rosuvastatin  40 mg Oral Daily   sodium chloride flush  3 mL Intravenous Q12H   tamsulosin  0.4 mg Oral QPC supper   torsemide  40 mg Oral Daily    Assessment: 72 yo male w/p CABG 9/9 with recurrent afib. Pharmacy consulted to dose apixaban -hg= 10.5, plt= 77 (low/stable) -SCr 1.0  Goal of Therapy:  Monitor platelets by anticoagulation protocol: Yes   Plan:  -apixaban 5mg  po bid  Harland German, PharmD Clinical Pharmacist **Pharmacist phone directory can now be found on [email or web address] (PW [email or web address]).  Listed under [facility].

## 2023-09-07 NOTE — Progress Notes (Signed)
4 Days Post-Op Procedure(s) (LRB): REDO CORONARY ARTERY BYPASS GRAFTING (CABG) x2, USING LEFT INTERNAL MAMMARY ARTERY,LEFT LEG GREATER SAPHENEOUS VEIN HARVESTED ENDOSCOPICLY (N/A) TRANSESOPHAGEAL ECHOCARDIOGRAM (N/A) Subjective: Feels ok but did not sleep much. Bowels getting back to normal.  Objective: Vital signs in last 24 hours: Temp:  [97.5 F (36.4 C)-99.3 F (37.4 C)] 98.2 F (36.8 C) (09/13 0358) Pulse Rate:  [69-109] 79 (09/13 0500) Cardiac Rhythm: Normal sinus rhythm (09/13 0358) Resp:  [17-20] 20 (09/13 0358) BP: (114-156)/(47-80) 151/62 (09/13 0358) SpO2:  [93 %-95 %] 94 % (09/13 0500) Weight:  [84.5 kg] 84.5 kg (09/13 0500)  Hemodynamic parameters for last 24 hours:    Intake/Output from previous day: 09/12 0701 - 09/13 0700 In: 671 [P.O.:480; I.V.:191] Out: 1750 [Urine:1750] Intake/Output this shift: No intake/output data recorded.  General appearance: alert and cooperative Neurologic: intact Heart: regular rate and rhythm Lungs: clear to auscultation bilaterally Extremities: edema minimal Wound: incisions ok  Lab Results: Recent Labs    09/05/23 0330 09/06/23 0440  WBC 7.2 7.1  HGB 10.4* 10.5*  HCT 31.3* 30.7*  PLT 73* 77*   BMET:  Recent Labs    09/05/23 0330 09/06/23 0440  NA 134* 135  K 4.0 4.2  CL 100 101  CO2 28 26  GLUCOSE 126* 113*  BUN 19 18  CREATININE 1.21 1.09  CALCIUM 7.7* 8.2*    PT/INR: No results for input(s): "LABPROT", "INR" in the last 72 hours. ABG    Component Value Date/Time   PHART 7.272 (L) 09/03/2023 2324   HCO3 22.4 09/03/2023 2324   TCO2 24 09/03/2023 2324   ACIDBASEDEF 5.0 (H) 09/03/2023 2324   O2SAT 93 09/03/2023 2324   CBG (last 3)  Recent Labs    09/05/23 0756 09/05/23 1115 09/05/23 1550  GLUCAP 175* 116* 100*    Assessment/Plan: S/P Procedure(s) (LRB): REDO CORONARY ARTERY BYPASS GRAFTING (CABG) x2, USING LEFT INTERNAL MAMMARY ARTERY,LEFT LEG GREATER SAPHENEOUS VEIN HARVESTED ENDOSCOPICLY  (N/A) TRANSESOPHAGEAL ECHOCARDIOGRAM (N/A)  POD 4  Hemodynamically stable in sinus rhythm but had a few episodes of atrial fib/flutter yesterday that self terminated. Baseline HR 80's to 90's on Lopressor 25 bid and BP good so will increase Lopressor to 50 bid. Continue amio 400 bid today and will plan to decrease to 200 bid after one week.   Will start Eliquis today for recurrent AF and continue ASA 81, DC Plavix.  Continue diuresis and Kdur today.  Plan home tomorrow if no changes.  LOS: 4 days    [name] 09/07/2023

## 2023-09-07 NOTE — TOC Benefit Eligibility Note (Signed)
Pharmacy Patient Advocate Encounter  Insurance verification completed.    The patient is insured through [employer]/ [employer]. Patient has Medicare and is not eligible for a copay card, but may be able to apply for patient assistance, if available.    Ran test claim for Eliquis and the current 30 day co-pay is $47.00.   This test claim was processed through [facility]- copay amounts may vary at other pharmacies due to pharmacy/plan contracts, or as the patient moves through the different stages of their insurance plan.

## 2023-09-07 NOTE — Plan of Care (Signed)
  Problem: Education: Goal: Understanding of cardiac disease, CV risk reduction, and recovery process will improve Outcome: Progressing Goal: Individualized Educational Video(s) Outcome: Progressing   Problem: Cardiac: Goal: Ability to achieve and maintain adequate cardiovascular perfusion will improve Outcome: Progressing   Problem: Health Behavior/Discharge Planning: Goal: Ability to safely manage health-related needs after discharge will improve Outcome: Progressing   Problem: Education: Goal: Understanding of CV disease, CV risk reduction, and recovery process will improve Outcome: Progressing Goal: Individualized Educational Video(s) Outcome: Progressing   Problem: Activity: Goal: Ability to return to baseline activity level will improve Outcome: Progressing   Problem: Cardiovascular: Goal: Ability to achieve and maintain adequate cardiovascular perfusion will improve Outcome: Progressing Goal: Vascular access site(s) Level 0-1 will be maintained Outcome: Progressing   Problem: Education: Goal: Knowledge of General Education information will improve Description: Including pain rating scale, medication(s)/side effects and non-pharmacologic comfort measures Outcome: Progressing   Problem: Health Behavior/Discharge Planning: Goal: Ability to manage health-related needs will improve Outcome: Progressing   Problem: Clinical Measurements: Goal: Ability to maintain clinical measurements within normal limits will improve Outcome: Progressing

## 2023-09-07 NOTE — Progress Notes (Signed)
CARDIAC REHAB PHASE I   PRE:  Rate/Rhythm: 69 SR  BP:  Sitting: 135/55      SaO2: 98 RA  MODE:  Ambulation: 470 ft   POST:  Rate/Rhythm: 71 SR   BP:  Sitting: 147/57      SaO2: 97 RA    Pt ambulated in hallway using front wheel walker. Moving at slow steady pace, maintaining proper sternal precautions with mobility. Returned to chair with call bell and bedside table in reach. Post [facility] education including site care, restrictions, heart healthy diet, sternal precautions, IS use at home, home needs at discharge, exercise guidelines and CRP2 reviewed. All questions and concerns addressed. Will refer to AP for CRP2.   [id number] Woodroe Chen, RN BSN 09/07/2023 11:12 AM

## 2023-09-07 NOTE — Progress Notes (Signed)
Mobility Specialist Progress Note:   09/07/23 1441  Mobility  Activity Ambulated with assistance in hallway  Level of Assistance Contact guard assist, steadying assist  Assistive Device Front wheel walker  Distance Ambulated (ft) 475 ft  RUE Weight Bearing NWB  LUE Weight Bearing NWB  Activity Response Tolerated well  Mobility Referral Yes  $Mobility charge 1 Mobility  Mobility Specialist Start Time (ACUTE ONLY) 1430  Mobility Specialist Stop Time (ACUTE ONLY) 1440  Mobility Specialist Time Calculation (min) (ACUTE ONLY) 10 min   Pre Mobility: 75 HR  During Mobility: 80 HR  Post Mobility: 77 HR   Pt received in chair, agreeable to mobility. Denied any discomfort during ambulation, asymptomatic throughout. Pt returned to chair with call bell in reach and all needs met.  Leory Plowman  Mobility Specialist Please contact via [employer] office at [phone]

## 2023-09-08 ENCOUNTER — Other Ambulatory Visit (HOSPITAL_COMMUNITY): Payer: Self-pay

## 2023-09-08 MED ORDER — METOPROLOL TARTRATE 50 MG PO TABS
50.0000 mg | ORAL_TABLET | Freq: Two times a day (BID) | ORAL | 1 refills | Status: DC
  Filled 2023-09-08: qty 60, 30d supply, fill #0

## 2023-09-08 MED ORDER — APIXABAN 5 MG PO TABS
5.0000 mg | ORAL_TABLET | Freq: Two times a day (BID) | ORAL | 1 refills | Status: DC
Start: 2023-09-08 — End: 2023-11-27
  Filled 2023-09-08: qty 60, 30d supply, fill #0

## 2023-09-08 MED ORDER — TRAMADOL HCL 50 MG PO TABS
50.0000 mg | ORAL_TABLET | Freq: Four times a day (QID) | ORAL | 0 refills | Status: DC | PRN
Start: 2023-09-08 — End: 2023-10-03
  Filled 2023-09-08: qty 28, 7d supply, fill #0

## 2023-09-08 MED ORDER — FUROSEMIDE 40 MG PO TABS
40.0000 mg | ORAL_TABLET | Freq: Every day | ORAL | 0 refills | Status: DC
  Filled 2023-09-08: qty 5, 5d supply, fill #0

## 2023-09-08 MED ORDER — AMIODARONE HCL 200 MG PO TABS
ORAL_TABLET | ORAL | 1 refills | Status: DC
  Filled 2023-09-08: qty 60, 44d supply, fill #0

## 2023-09-08 MED ORDER — ACETAMINOPHEN 325 MG PO TABS: 650.0000 mg | ORAL_TABLET | Freq: Four times a day (QID) | ORAL | Status: DC | PRN

## 2023-09-08 MED ORDER — TELMISARTAN 40 MG PO TABS
40.0000 mg | ORAL_TABLET | Freq: Every day | ORAL | 1 refills | Status: DC
  Filled 2023-09-08: qty 30, 30d supply, fill #0

## 2023-09-08 MED ORDER — ROSUVASTATIN CALCIUM 40 MG PO TABS
40.0000 mg | ORAL_TABLET | Freq: Every day | ORAL | 1 refills | Status: DC
  Filled 2023-09-08: qty 30, 30d supply, fill #0

## 2023-09-08 MED ORDER — POTASSIUM CHLORIDE CRYS ER 20 MEQ PO TBCR
20.0000 meq | EXTENDED_RELEASE_TABLET | Freq: Every day | ORAL | 0 refills | Status: DC
  Filled 2023-09-08: qty 5, 5d supply, fill #0

## 2023-09-08 NOTE — Plan of Care (Signed)
  Problem: Education: Goal: Understanding of cardiac disease, CV risk reduction, and recovery process will improve Outcome: Progressing Goal: Individualized Educational Video(s) Outcome: Progressing   Problem: Activity: Goal: Ability to tolerate increased activity will improve Outcome: Progressing   Problem: Cardiac: Goal: Ability to achieve and maintain adequate cardiovascular perfusion will improve Outcome: Progressing   Problem: Education: Goal: Understanding of CV disease, CV risk reduction, and recovery process will improve Outcome: Progressing Goal: Individualized Educational Video(s) Outcome: Progressing   Problem: Activity: Goal: Ability to return to baseline activity level will improve Outcome: Progressing   Problem: Cardiovascular: Goal: Ability to achieve and maintain adequate cardiovascular perfusion will improve Outcome: Progressing Goal: Vascular access site(s) Level 0-1 will be maintained Outcome: Progressing   Problem: Health Behavior/Discharge Planning: Goal: Ability to safely manage health-related needs after discharge will improve Outcome: Progressing   Problem: Education: Goal: Knowledge of General Education information will improve Description: Including pain rating scale, medication(s)/side effects and non-pharmacologic comfort measures Outcome: Progressing   Problem: Health Behavior/Discharge Planning: Goal: Ability to manage health-related needs will improve Outcome: Progressing   Problem: Pain Managment: Goal: General experience of comfort will improve Outcome: Progressing   Problem: Skin Integrity: Goal: Risk for impaired skin integrity will decrease Outcome: Progressing

## 2023-09-08 NOTE — Progress Notes (Signed)
Pt/family given discharge instructions, medication lists, follow up appointments, and when to call the doctor.  Pt/family verbalizes understanding. CCMD called and telemetry removed. IV out. Pt given signs and symptoms of infection. Thomas Hoff, RN

## 2023-09-08 NOTE — TOC Transition Note (Addendum)
Transition of Care [facility]) - CM/SW Discharge Note   Patient Details  Name: Jonathan [name] MRN: [id number] Date of Birth: 1951/02/08  Transition of Care [facility]) CM/SW Contact:  Ronny Bacon, RN Phone Number: 09/08/2023, 9:13 AM   Clinical Narrative:  Patient is expected to be discharged today. RW ordered through [employer]. Discharge medications to come from [facility] pharmacy.    Final next level of care: Home/Self Care Barriers to Discharge: Continued Medical Work up   Patient Goals and CMS Choice   Choice offered to / list presented to : NA  Discharge Placement                         Discharge Plan and Services Additional resources added to the After Visit Summary for   In-house Referral: NA Discharge Planning Services: CM Consult            DME Arranged: Dan Humphreys rolling DME Agency: [employer] Date DME Agency Contacted: 09/08/23 Time DME Agency Contacted: [phone] Representative spoke with at DME Agency: Selena Batten            Social Determinants of Health (SDOH) Interventions SDOH Screenings   Food Insecurity: No Food Insecurity (09/04/2023)  Housing: Patient Declined (09/04/2023)  Transportation Needs: No Transportation Needs (09/04/2023)  Utilities: Not At Risk (09/04/2023)  Tobacco Use: Medium Risk (09/03/2023)     Readmission Risk Interventions     No data to display

## 2023-09-08 NOTE — Progress Notes (Addendum)
      [address]       [place] [address]             [phone]      5 Days Post-Op Procedure(s) (LRB): REDO CORONARY ARTERY BYPASS GRAFTING (CABG) x2, USING LEFT INTERNAL MAMMARY ARTERY,LEFT LEG GREATER SAPHENEOUS VEIN HARVESTED ENDOSCOPICLY (N/A) TRANSESOPHAGEAL ECHOCARDIOGRAM (N/A) Subjective: Patient with no new complaints this AM  Objective: Vital signs in last 24 hours: Temp:  [98.1 F (36.7 C)-98.5 F (36.9 C)] 98.5 F (36.9 C) (09/14 0823) Pulse Rate:  [73] 73 (09/13 1600) Cardiac Rhythm: Normal sinus rhythm (09/13 2100) Resp:  [16-20] 16 (09/14 0823) BP: (136-164)/(50-69) 154/50 (09/14 0823) SpO2:  [92 %-100 %] 97 % (09/13 2312)  Hemodynamic parameters for last 24 hours:    Intake/Output from previous day: 09/13 0701 - 09/14 0700 In: 3 [I.V.:3] Out: 500 [Urine:500] Intake/Output this shift: No intake/output data recorded.  General appearance: alert, cooperative, and no distress Neurologic: intact Heart: regular rate and rhythm, S1, S2 normal, no murmur, click, rub or gallop Lungs: clear to auscultation bilaterally Abdomen: soft, non-tender; bowel sounds normal; no masses,  no organomegaly Extremities: edema trace Wound: Clean and dry without sign of infection  Lab Results: Recent Labs    09/06/23 0440  WBC 7.1  HGB 10.5*  HCT 30.7*  PLT 77*   BMET:  Recent Labs    09/06/23 0440  NA 135  K 4.2  CL 101  CO2 26  GLUCOSE 113*  BUN 18  CREATININE 1.09  CALCIUM 8.2*    PT/INR: No results for input(s): "LABPROT", "INR" in the last 72 hours. ABG    Component Value Date/Time   PHART 7.272 (L) 09/03/2023 2324   HCO3 22.4 09/03/2023 2324   TCO2 24 09/03/2023 2324   ACIDBASEDEF 5.0 (H) 09/03/2023 2324   O2SAT 93 09/03/2023 2324   CBG (last 3)  Recent Labs    09/05/23 1115 09/05/23 1550  GLUCAP 116* 100*    Assessment/Plan: S/P Procedure(s) (LRB): REDO CORONARY ARTERY BYPASS GRAFTING (CABG) x2, USING LEFT INTERNAL  MAMMARY ARTERY,LEFT LEG GREATER SAPHENEOUS VEIN HARVESTED ENDOSCOPICLY (N/A) TRANSESOPHAGEAL ECHOCARDIOGRAM (N/A)  CV: Hx of recurrent afib/aflutter. On Amiodarone 400mg  BID, Lopressor 50mg  BID and Eliquis 5mg  BID. Will decrease Amio to 200mg  BID after 1 week. HTN with BPs in the 140s-160s, HR 60s-80s. Titrate Lopressor vs start Avapro at half dose since cannot give home Telmisartan while in the hospital.  Pulm: Saturating well on RA. Encourage IS and ambulation.   GI: +BM 09/12  Renal: Cr has been stable. On Torsemide 40mg  daily. Weight +4kg. Hx of BPH, on Flomax.   DVT Prophylaxis: On Eliquis  Dispo: Work on BP control and hopefully can d/c to home tomorrow AM.   Addendum: As discussed with Dr. Leafy Ro will restart half home dose of Telmisartan and discharge to home today.    LOS: 5 days    Jenny Reichmann, PA-C 09/08/2023

## 2023-09-10 DIAGNOSIS — I70209 Unspecified atherosclerosis of native arteries of extremities, unspecified extremity: Secondary | ICD-10-CM | POA: Diagnosis not present

## 2023-09-10 DIAGNOSIS — N401 Enlarged prostate with lower urinary tract symptoms: Secondary | ICD-10-CM | POA: Diagnosis not present

## 2023-09-10 DIAGNOSIS — I252 Old myocardial infarction: Secondary | ICD-10-CM | POA: Diagnosis not present

## 2023-09-10 DIAGNOSIS — I4892 Unspecified atrial flutter: Secondary | ICD-10-CM | POA: Diagnosis not present

## 2023-09-10 DIAGNOSIS — Z951 Presence of aortocoronary bypass graft: Secondary | ICD-10-CM | POA: Diagnosis not present

## 2023-09-10 DIAGNOSIS — E785 Hyperlipidemia, unspecified: Secondary | ICD-10-CM | POA: Diagnosis not present

## 2023-09-10 DIAGNOSIS — D649 Anemia, unspecified: Secondary | ICD-10-CM | POA: Diagnosis not present

## 2023-09-10 DIAGNOSIS — K449 Diaphragmatic hernia without obstruction or gangrene: Secondary | ICD-10-CM | POA: Diagnosis not present

## 2023-09-10 DIAGNOSIS — N138 Other obstructive and reflux uropathy: Secondary | ICD-10-CM | POA: Diagnosis not present

## 2023-09-10 DIAGNOSIS — Z87891 Personal history of nicotine dependence: Secondary | ICD-10-CM | POA: Diagnosis not present

## 2023-09-10 DIAGNOSIS — I25110 Atherosclerotic heart disease of native coronary artery with unstable angina pectoris: Secondary | ICD-10-CM | POA: Diagnosis not present

## 2023-09-10 DIAGNOSIS — K219 Gastro-esophageal reflux disease without esophagitis: Secondary | ICD-10-CM | POA: Diagnosis not present

## 2023-09-10 DIAGNOSIS — Z7982 Long term (current) use of aspirin: Secondary | ICD-10-CM | POA: Diagnosis not present

## 2023-09-10 DIAGNOSIS — Z955 Presence of coronary angioplasty implant and graft: Secondary | ICD-10-CM | POA: Diagnosis not present

## 2023-09-10 DIAGNOSIS — Z48812 Encounter for surgical aftercare following surgery on the circulatory system: Secondary | ICD-10-CM | POA: Diagnosis not present

## 2023-09-10 DIAGNOSIS — Z7901 Long term (current) use of anticoagulants: Secondary | ICD-10-CM | POA: Diagnosis not present

## 2023-09-10 DIAGNOSIS — I4891 Unspecified atrial fibrillation: Secondary | ICD-10-CM | POA: Diagnosis not present

## 2023-09-10 DIAGNOSIS — I10 Essential (primary) hypertension: Secondary | ICD-10-CM | POA: Diagnosis not present

## 2023-09-12 DIAGNOSIS — Z4802 Encounter for removal of sutures: Secondary | ICD-10-CM

## 2023-09-13 MED FILL — Sodium Chloride IV Soln 0.9%: INTRAVENOUS | Qty: 3000 | Status: AC

## 2023-09-13 MED FILL — Electrolyte-R (PH 7.4) Solution: INTRAVENOUS | Qty: 6000 | Status: AC

## 2023-09-13 MED FILL — Sodium Bicarbonate IV Soln 8.4%: INTRAVENOUS | Qty: 100 | Status: AC

## 2023-09-13 MED FILL — Albumin, Human Inj 5%: INTRAVENOUS | Qty: 250 | Status: AC

## 2023-09-13 MED FILL — Lidocaine HCl Local Soln Prefilled Syringe 100 MG/5ML (2%): INTRAMUSCULAR | Qty: 15 | Status: AC

## 2023-09-13 MED FILL — Mannitol IV Soln 20%: INTRAVENOUS | Qty: 500 | Status: AC

## 2023-09-13 MED FILL — Calcium Chloride Inj 10%: INTRAVENOUS | Qty: 10 | Status: AC

## 2023-09-13 MED FILL — Heparin Sodium (Porcine) Inj 1000 Unit/ML: INTRAMUSCULAR | Qty: 40 | Status: AC

## 2023-09-14 ENCOUNTER — Other Ambulatory Visit (HOSPITAL_COMMUNITY): Payer: Self-pay

## 2023-09-26 ENCOUNTER — Ambulatory Visit: Payer: PPO | Admitting: Nurse Practitioner

## 2023-10-03 ENCOUNTER — Ambulatory Visit (INDEPENDENT_AMBULATORY_CARE_PROVIDER_SITE_OTHER): Payer: Self-pay | Admitting: Surgery

## 2023-10-03 ENCOUNTER — Other Ambulatory Visit: Payer: Self-pay | Admitting: Surgery

## 2023-10-03 ENCOUNTER — Encounter: Payer: Self-pay | Admitting: Surgery

## 2023-10-03 ENCOUNTER — Ambulatory Visit
Admission: RE | Admit: 2023-10-03 | Discharge: 2023-10-03 | Disposition: A | Discharge status: Home / Self Care | Payer: PPO | Source: Ambulatory Visit | Attending: Surgery | Admitting: Surgery

## 2023-10-03 VITALS — BP 163/67 | HR 50 | Resp 18 | Ht 68.0 in | Wt 176.0 lb

## 2023-10-03 DIAGNOSIS — Z951 Presence of aortocoronary bypass graft: Secondary | ICD-10-CM

## 2023-10-03 DIAGNOSIS — I25119 Atherosclerotic heart disease of native coronary artery with unspecified angina pectoris: Secondary | ICD-10-CM

## 2023-10-03 DIAGNOSIS — J90 Pleural effusion, not elsewhere classified: Secondary | ICD-10-CM | POA: Diagnosis not present

## 2023-10-03 DIAGNOSIS — J9811 Atelectasis: Secondary | ICD-10-CM | POA: Diagnosis not present

## 2023-10-03 NOTE — Progress Notes (Signed)
HPI: Patient returns for routine postoperative follow-up having undergone redo CABG x 2 on 09/03/2023. The patient's early postoperative recovery while in the hospital was notable for development of postoperative atrial fibrillation converted with amiodarone.  He was started on Eliquis due to recurrent episodes. Since hospital discharge the patient reports that he has been feeling fairly well overall.  He has been walking some but not as much as he should.  He has had some persistent nausea.   Current Outpatient Medications  Medication Sig Dispense Refill   acetaminophen (TYLENOL) 325 MG tablet Take 2 tablets (650 mg total) by mouth every 6 (six) hours as needed for mild pain.     amiodarone (PACERONE) 200 MG tablet Take 2 tablets (400 mg total) by mouth 2 (two) times daily for 3 days, THEN 1 tablet (200 mg total) 2 (two) times daily for 7 days, THEN 1 tablet (200 mg total) daily thereafter. 60 tablet 1   apixaban (ELIQUIS) 5 MG TABS tablet Take 1 tablet (5 mg total) by mouth 2 (two) times daily. 60 tablet 1   aspirin EC 81 MG tablet Take 1 tablet (81 mg total) by mouth daily. Swallow whole. 30 tablet 5   ezetimibe (ZETIA) 10 MG tablet Take 1 tablet (10 mg total) by mouth daily. Take 1 tablet by mouth daily. 90 tablet 3   furosemide (LASIX) 40 MG tablet Take 1 tablet (40 mg total) by mouth daily. Take for 5 days then stop 5 tablet 0   metoprolol tartrate (LOPRESSOR) 50 MG tablet Take 1 tablet (50 mg total) by mouth 2 (two) times daily. 60 tablet 1   pantoprazole (PROTONIX) 40 MG tablet Take 1 tablet (40 mg total) by mouth daily. 30 tablet 1   potassium chloride SA (KLOR-CON M) 20 MEQ tablet Take 1 tablet (20 mEq total) by mouth daily. Take for 5 days then stop 5 tablet 0   rosuvastatin (CRESTOR) 40 MG tablet Take 1 tablet (40 mg total) by mouth daily. 30 tablet 1   tamsulosin (FLOMAX) 0.4 MG CAPS capsule Take 1 capsule (0.4 mg total) by mouth daily after supper.     telmisartan (MICARDIS) 40  MG tablet Take 1 tablet (40 mg total) by mouth daily. 30 tablet 1   No current facility-administered medications for this visit.    Physical Exam: BP (!) 163/67 (BP Location: Left Arm, Patient Position: Sitting)   Pulse (!) 50   Resp 18   Ht 5\' 8"  (1.727 m)   Wt 176 lb (79.8 kg)   SpO2 96% Comment: rA  BMI 26.76 kg/m  He looks well. Cardiac exam shows a regular rate and rhythm with normal heart sounds. Lungs are clear. The chest incision is healing well and the sternum is stable. There is no peripheral edema.  His leg incision is healing well.  Diagnostic Tests:  Narrative & Impression  CLINICAL DATA:  Status post CABG x2 09/03/2023.  Hypertension.   EXAM: CHEST - 2 VIEW   COMPARISON:  Chest radiographs 09/05/2023, 09/04/2023, 08/31/2023   FINDINGS: Status post median sternotomy. Interval removal of the prior right internal jugular central venous catheter sheath.   Dense coronary artery calcifications and/or grafts. Cardiac silhouette is at the upper limits of normal size. Moderate atherosclerotic calcifications within the aortic arch. Tiny left pleural effusion appears similar to prior. No right pleural effusion. No pneumothorax is seen. No left basilar linear likely subsegmental atelectasis. Mild-to-moderate multilevel degenerative disc changes of the thoracic spine. Lower cervical spine ACDF  hardware.   IMPRESSION: Tiny left pleural effusion and left basilar linear likely subsegmental atelectasis appears similar to most recent prior 09/05/2023 comparison.     Electronically Signed   By: Neita Garnet M.D.   On: 10/03/2023 12:16      Impression:  Overall I think he is making good progress following his surgery.  He has had some persistent nausea that has been keeping him from eating normally.  He appears to be in sinus rhythm today with a heart rate of 50 and I told him to discontinue the amiodarone.  I suspect this is the most likely cause of his nausea.   He will continue his current dose of metoprolol.  I will leave him on the Eliquis until we are sure that he is maintaining sinus rhythm off amiodarone.  I told him that he could return to driving a car but should refrain from lifting anything heavier than 10 pounds for 3 months postoperatively.  Plan:  He will continue to follow-up with cardiology and will return to see me if he has any problems with his incision.   [name], MD [facility] [phone]

## 2023-10-08 ENCOUNTER — Other Ambulatory Visit (HOSPITAL_COMMUNITY): Payer: Self-pay

## 2023-10-16 ENCOUNTER — Other Ambulatory Visit: Payer: Self-pay | Admitting: Internal Medicine

## 2023-10-26 ENCOUNTER — Ambulatory Visit: Payer: PPO | Admitting: Nurse Practitioner

## 2023-11-27 ENCOUNTER — Other Ambulatory Visit: Payer: Self-pay | Admitting: Physician Assistant

## 2023-12-10 ENCOUNTER — Ambulatory Visit: Payer: PPO | Attending: Internal Medicine | Admitting: Internal Medicine

## 2023-12-10 VITALS — BP 166/66 | HR 61 | Ht 67.0 in | Wt 177.0 lb

## 2023-12-10 DIAGNOSIS — E785 Hyperlipidemia, unspecified: Secondary | ICD-10-CM

## 2023-12-10 DIAGNOSIS — I739 Peripheral vascular disease, unspecified: Secondary | ICD-10-CM

## 2023-12-10 DIAGNOSIS — I10 Essential (primary) hypertension: Secondary | ICD-10-CM | POA: Diagnosis not present

## 2023-12-10 DIAGNOSIS — I25119 Atherosclerotic heart disease of native coronary artery with unspecified angina pectoris: Secondary | ICD-10-CM

## 2023-12-10 DIAGNOSIS — Z951 Presence of aortocoronary bypass graft: Secondary | ICD-10-CM

## 2023-12-10 NOTE — Progress Notes (Signed)
OFFICE NOTE  Chief Complaint:  Follow-up  Primary Care Physician: Assunta Found, MD  HPI:  Jonathan [name] is a pleasant 72 year old male patient who was formerly followed by Dr. Alanda Amass. He has an extensive past medical history of coronary disease. He has had numerous prior stents to the right coronary artery and LAD and underwent brachii therapy to the LAD. Eventually developed occlusion of the right coronary artery and ultimately underwent a right free radial graft to the PDA by Dr. Laneta Simmers in 2009. He also has a history of peripheral arterial disease and underwent right common iliac and femoral artery stenting in 2003, but does have bilateral disease which has not been studied since 2009. In addition he has hypertension, GERD and is here today for preoperative cardiac evaluation. He's been having numbness and tingling in his hands as well as legs and is felt to have a cord problem in the neck. His neurosurgeon is Dr. Mikal Plane and was planning on operating yesterday however he was noted to have an abnormal EKG and was sent for preoperative evaluation. It should be noted he had a negative nuclear stress test for ischemia in 07/2013. Currently denies any chest pain. He is able to exert himself even walking up stairs without shortness of breath but is having problems with numbness and tingling in his legs as well as cool extremities.  [name] returns today for follow-up. He underwent lumbar surgery by Dr. Mikal Plane and he reports he still has problems with leg pain and cramping. He denies any chest pain or worsening shortness of breath. Blood pressure is well-controlled today.  11/23/2016  [name] returns today for follow-up. He is generally without complaints. He reports that he is taking 10 mg of Crestor every other day due to some leg pains. He says this is improved his symptoms somewhat. Cholesterol however is not at goal. EKG shows normal sinus rhythm. He has no chest pain or shortness of  breath. He continues to say that he's had no significant improvement in his back pain with back surgery.  11/23/2017  [name] was seen today in follow-up.  Overall he seems to be doing well.  He reports some occasional lightheadedness but no chest pain or worsening shortness of breath.  He reports some fatigue in his legs but no significant claudication.  He did have some mild PAD in 2015 which was unchanged since 2009.  He is also overdue for a lipid profile.  He has not followed up with his primary care provider except for when he gets sick.  02/16/2021  [name] returns today for follow-up.  Overall he seems to be doing well.  He had last seen by Azalee Course, PA-C.  He had describes some intermittent chest pain however he says he is had none since then.  He also has a history of some PAD.  He had Dopplers back in 2020, but denies any claudication.  He says he plays golf is fairly active.  He is recently doing some bowling.  He had lipids reassessed in October which showed triglycerides were normal at 172.  He is not currently taking Vascepa, but is on ezetimibe and rosuvastatin.  Blood pressure is elevated today however he chalked it up to going out bowling last night and drinking probably too much alcohol.  11/07/2022  [name] is seen today in follow-up.  He recently had seen Eligha Bridegroom, NP, for concerns of chest pain.  He underwent a Myoview stress test which  I interpreted back in September.  This showed a large, reversible basal to apical inferior perfusion defect that partially improves with upright imaging.  I felt that there might be a component of attenuation artifact however could not rule out reversible ischemia.  Initial medical therapy was recommended and he was started on Ranexa in addition to Imdur.  He has had some minimal relief with this but has had to take nitro least 5 times since then.  He also notes that he was trying to cut down a tree limb with a hand saw and developed  chest pain and had to stop.  He has since then avoided any significant physical activity.  11/27/2022  [name] is seen today in follow-up.  He underwent left heart catheterization on November 08, 2022 which showed an 80% ostial circumflex lesion.  This was successfully stented with a drug-eluting stent and he has noted no issues since then.  Of note his blood pressure remains elevated today 152/72.  His home blood pressure readings are elevated as well.  He is also overdue for a lipid profile.  12/10/2023  [name] is seen today in follow-up.  Unfortunately he has had progressive coronary artery disease and underwent recent repeat coronary artery bypass grafting x 2 in September.  He has done well with that and denies any symptoms at this point.  He had perioperative atrial fibrillation.  This was new.  He was started on amiodarone but had significant nausea with it and has since stopped it.  This was at the direction of Dr. Laneta Simmers.  G today shows he is in sinus rhythm.  His blood pressure was somewhat elevated 166/66.  Lipids in August showed a elevated triglycerides 178.  He has not had them reassessed.  He is on telmisartan and metoprolol for blood pressure but had not taken his medicines today.  He says his home blood pressure readings are typically in the 140 systolic but he is never seen blood pressures of 120.  PMHx:  Past Medical History:  Diagnosis Date   Anemia    CAD (coronary artery disease)    Femoral artery stenosis (HCC)    Gastroesophageal reflux    H/O hiatal hernia    Hyperlipidemia    Hypertension    Myocardial infarction (HCC) 11/24/2001   Neuromuscular disorder (HCC)    Bilateral Leg Neuropathy   PAD (peripheral artery disease) (HCC)    bilateral iliac artery stents    PONV (postoperative nausea and vomiting)    most of the time he gets nauseated   Prostate hyperplasia with urinary obstruction    S/P CABG x 1 12/26/2007   R radial to PDA   Tobacco consumption     quit cigarettes in 2008, smokes 5-6 cigars/week    Past Surgical History:  Procedure Laterality Date   ANTERIOR CERVICAL DECOMP/DISCECTOMY FUSION N/A 10/22/2014   Procedure: CERVICAL SIX SEVEN ANTERIOR CERVICAL DECOMPRESSION/DISCECTOMY FUSION  ;  Surgeon: Coletta Memos, MD;  Location: [facility];  Service: Neurosurgery;  Laterality: N/A;  C67 Possible C7T1 anterior cervical decompression with fusion plating and bonegraft   BACK SURGERY     CARDIAC CATHETERIZATION  12/03/2001   2.5x38mm S-660 stent to RCA (MI)   CARDIAC CATHETERIZATION  05/06/2002   PTCA & stenting of LAD and PTCA & stenting of mid/distal RCA with 3.0x35mm Zeta stent   CARDIAC CATHETERIZATION  06/03/2002   LAD with prox 30% in-stent restenosis; ramus with 95% ostial lesion; L Cfx & 3-OMs free of  disease; mild 30% RCA narrowing & 20-30% in-stent restenosis in RCA (Dr. Laurell Josephs)   CARDIAC CATHETERIZATION  03/24/2003   overlapping tandem DES 3.0x23 Cypher stent to mid RCA for in-stent restenosis & new lesion beyond previous stent with 3.0x12 SciMed TAXUS (Dr. Jonette Eva)   CARDIAC CATHETERIZATION  12/08/2003   3.0x24 TAXUS overlapping 3.0x6mm TAXUS DES in RCA and cuttin balloon atherectomy (Dr. Jonette Eva)   CARDIAC CATHETERIZATION  07/10/2007   normal L main, L Cfx & 3-OMs free of disease; LAD with Zeta stent in prox region (patent); RCA with multiple stents (3.0x12 Taxus in ostium with 3.0x20 Taxus overlapping it, and series of 3.0x24 Tacus and Cypher stent overlapping this, and in distal RC 2x5x67mm Zeta(?) stent; ostium of RCA with 80% narrowing - 3x15 cutting balloon - 80% to patient w/NO obstruction (Dr. Mervyn Skeeters. Little)   CARDIAC CATHETERIZATION  06/08/2008   high-grade osital & prox in-stent RCA restenosis - Dr. Jonette Eva   COLONOSCOPY N/A 11/22/2015   Procedure: COLONOSCOPY;  Surgeon: Corbin Ade, MD;  Location: [facility];  Service: Endoscopy;  Laterality: N/A;  100   CORONARY ARTERY BYPASS GRAFT  2009   free  right radial to PDA for progressive RCA disease & multiple re-stenosis (Dr. Wayland Salinas)   CORONARY ARTERY BYPASS GRAFT N/A 09/03/2023   Procedure: REDO CORONARY ARTERY BYPASS GRAFTING (CABG) x2, USING LEFT INTERNAL MAMMARY ARTERY,LEFT LEG GREATER SAPHENEOUS VEIN HARVESTED ENDOSCOPICLY;  Surgeon: [name], MD;  Location: [facility] OR;  Service: Open Heart Surgery;  Laterality: N/A;   CORONARY BALLOON ANGIOPLASTY N/A 08/17/2023   Procedure: CORONARY BALLOON ANGIOPLASTY;  Surgeon: Swaziland, Peter M, MD;  Location: [facility] INVASIVE CV LAB;  Service: Cardiovascular;  Laterality: N/A;   CORONARY STENT INTERVENTION N/A 11/08/2022   Procedure: CORONARY STENT INTERVENTION;  Surgeon: Marykay Lex, MD;  Location: [facility] INVASIVE CV LAB;  Service: Cardiovascular;  Laterality: N/A;   CORONARY ULTRASOUND/IVUS N/A 08/17/2023   Procedure: Coronary Ultrasound/IVUS;  Surgeon: Swaziland, Peter M, MD;  Location: [facility] INVASIVE CV LAB;  Service: Cardiovascular;  Laterality: N/A;   ILIAC ARTERY STENT Bilateral 01/30/2002   9x73mm Cordis SmartStent to LCIA, 9x3.22mm Genesis stent to RCIA (Dr. Erlene Quan)   LEFT HEART CATH AND CORS/GRAFTS ANGIOGRAPHY N/A 11/08/2022   Procedure: LEFT HEART CATH AND CORS/GRAFTS ANGIOGRAPHY;  Surgeon: Marykay Lex, MD;  Location: [facility] INVASIVE CV LAB;  Service: Cardiovascular;  Laterality: N/A;   LEFT HEART CATH AND CORS/GRAFTS ANGIOGRAPHY N/A 08/17/2023   Procedure: LEFT HEART CATH AND CORS/GRAFTS ANGIOGRAPHY;  Surgeon: Swaziland, Peter M, MD;  Location: [facility] INVASIVE CV LAB;  Service: Cardiovascular;  Laterality: N/A;   Lower Extremity Arterial Doppler  2009   R CIA stent - less than 50% diameter reduction; R CFA stent - 0-49% diameter reduction; R SFA less than 50% diameter reduction; L CFA, profunda, SFA - equal/less than 50% diameter reduction   LUMBAR LAMINECTOMY/DECOMPRESSION MICRODISCECTOMY Bilateral 03/18/2015   Procedure: LUMBAR LAMINECTOMY/DECOMPRESSION MICRODISCECTOMY 2 LEVELS;  Surgeon: Coletta Memos, MD;   Location: [facility];  Service: Neurosurgery;  Laterality: Bilateral;  Bilateral L34 L45 laminectomies   NM MYOCAR PERF WALL MOTION  07/2013   bruce myoview - intermediate risk test with abnormal inferior horizontal ST depression; mild, fixed inferobasal defect, but no ischemia   TEE WITHOUT CARDIOVERSION N/A 09/03/2023   Procedure: TRANSESOPHAGEAL ECHOCARDIOGRAM;  Surgeon: [name], MD;  Location: [facility] OR;  Service: Open Heart Surgery;  Laterality: N/A;   TRANSTHORACIC ECHOCARDIOGRAM  07/2013  EF 55-60%, mild LVH; mild MR; LA mildly dilated; RVSP increased; PA peak pressure (mod pulm HTN)    FAMHx:  Family History  Problem Relation Age of Onset   Heart disease Father    CAD Brother        CABG   CAD Brother        CABG   Colon cancer Neg Hx     SOCHx:   reports that he quit smoking about 15 months ago. His smoking use included cigarettes. He started smoking about 31 years ago. He has a 6 pack-year smoking history. He has never used smokeless tobacco. He reports current alcohol use of about 12.0 standard drinks of alcohol per week. He reports that he does not use drugs.  ALLERGIES:  No Known Allergies  ROS: Pertinent items noted in HPI and remainder of comprehensive ROS otherwise negative.  HOME MEDS: Current Outpatient Medications  Medication Sig Dispense Refill   aspirin EC 81 MG tablet Take 1 tablet (81 mg total) by mouth daily. Swallow whole. 30 tablet 5   ELIQUIS 5 MG TABS tablet TAKE 1 TABLET BY MOUTH 2 TIMES DAILY. 60 tablet 0   ezetimibe (ZETIA) 10 MG tablet Take 1 tablet (10 mg total) by mouth daily. Take 1 tablet by mouth daily. 90 tablet 3   furosemide (LASIX) 40 MG tablet Take 1 tablet (40 mg total) by mouth daily. Take for 5 days then stop 5 tablet 0   metoprolol succinate (TOPROL-XL) 50 MG 24 hr tablet TAKE 1 TABLET BY MOUTH EVERY DAY WITH OR IMMEDIATELY FOLLOWING A MEAL. 90 tablet 0   pantoprazole (PROTONIX) 40 MG tablet Take 1 tablet (40 mg total) by  mouth daily. 30 tablet 1   potassium chloride SA (KLOR-CON M) 20 MEQ tablet Take 1 tablet (20 mEq total) by mouth daily. Take for 5 days then stop 5 tablet 0   rosuvastatin (CRESTOR) 40 MG tablet Take 1 tablet (40 mg total) by mouth daily. 30 tablet 1   tamsulosin (FLOMAX) 0.4 MG CAPS capsule Take 1 capsule (0.4 mg total) by mouth daily after supper.     telmisartan (MICARDIS) 40 MG tablet Take 1 tablet (40 mg total) by mouth daily. 30 tablet 1   acetaminophen (TYLENOL) 325 MG tablet Take 2 tablets (650 mg total) by mouth every 6 (six) hours as needed for mild pain. (Patient not taking: Reported on 12/10/2023)     amiodarone (PACERONE) 200 MG tablet Take 2 tablets (400 mg total) by mouth 2 (two) times daily for 3 days, THEN 1 tablet (200 mg total) 2 (two) times daily for 7 days, THEN 1 tablet (200 mg total) daily thereafter. 60 tablet 1   metoprolol tartrate (LOPRESSOR) 50 MG tablet Take 1 tablet (50 mg total) by mouth 2 (two) times daily. (Patient not taking: Reported on 12/10/2023) 60 tablet 1   No current facility-administered medications for this visit.    LABS/IMAGING: No results found for this or any previous visit (from the past 48 hours). No results found.  VITALS: BP (!) 166/66 (BP Location: Right Arm, Patient Position: Sitting, Cuff Size: Normal)   Pulse 61   Ht 5\' 7"  (1.702 m)   Wt 177 lb (80.3 kg)   SpO2 98%   BMI 27.72 kg/m   EXAM: General appearance: alert and no distress Neck: no carotid bruit and no JVD Lungs: clear to auscultation bilaterally Heart: regular rate and rhythm, S1, S2 normal, no murmur, click, rub or gallop Abdomen: soft, non-tender;  bowel sounds normal; no masses,  no organomegaly Extremities: cool to touch, diminished pulses no edema Pulses: 1+ Skin: pale, cool, dry Neurologic: Mental status: Alert, oriented, thought content appropriate Psych: Normal  EKG: EKG Interpretation Date/Time:  Monday December 10 2023 10:12:57 EST Ventricular Rate:   58 PR Interval:  194 QRS Duration:  110 QT Interval:  476 QTC Calculation: 467 R Axis:   64  Text Interpretation: Sinus bradycardia When compared with ECG of 06-Sep-2023 10:04, Sinus rhythm has replaced Atrial fibrillation Vent. rate has decreased BY  52 BPM T wave inversion no longer evident in Inferior leads T wave inversion no longer evident in Lateral leads Confirmed by Zoila Shutter [phone]) on 12/10/2023 10:52:48 AM    ASSESSMENT: Unstable angina -status post left heart cath with PCI to the ostial circumflex (10/2022) Abnormal Myoview stress test with reversible inferior ischemia Coronary artery disease status post single vessel CABG in 2009 (free right radial to PDA) - > now status post redo two-vessel CABG (08/2023) Numerous prior coronary interventions to the RCA and LAD with brachii therapy PAD status post right common iliac and right superficial femoral artery stents in 2003 Hypertension Dyslipidemia, goal LDL less than 70 PAF on Eliquis  PLAN: 1.   Mr. Parmley unfortunately had progression of coronary disease and required redo CABG which was a two-vessel procedure in September 2024.  Since then he says he has been doing well.  Unfortunately had some paroxysmal atrial fibrillation.  He is in sinus rhythm today.  He was on amiodarone however this was stopped due to intolerance.  He is on Eliquis.  Data suggest that even perioperative A-fib is more likely to potentially recur in patients and I would favor continuing Eliquis long-term.  At this point I think he can stop aspirin as he is more than 3 months out from his surgery.  CT surgery has signed off.  EKG shows he is in sinus rhythm today.  Blood pressure again may need to be better controlled.  He will monitor this at home and report back to [place].  Plan otherwise follow-up in 6 months or sooner as necessary.  [name], MD, [facility], FACP  [place]  [facility]  Medical Director of the [facility]   [facility] Attending Cardiologist  Direct Dial: [phone]  Fax: [phone]  Website:  [email or web address] Nicely Disha Cottam 12/10/2023, 10:52 AM

## 2023-12-10 NOTE — Patient Instructions (Addendum)
Medication Instructions:  STOP aspirin 81mg    Check BPs at home and send an update with readings after about 7-10 days. Check twice daily. Ideal to check 1-2 hours after taking your BP meds.  HOW TO TAKE YOUR BLOOD PRESSURE: Rest 5 minutes before taking your blood pressure. Don't smoke or drink caffeinated beverages for at least 30 minutes before. Take your blood pressure before (not after) you eat. Sit comfortably with your back supported and both feet on the floor (don't cross your legs). Elevate your arm to heart level on a table or a desk. Use the proper sized cuff. It should fit smoothly and snugly around your bare upper arm. There should be enough room to slip a fingertip under the cuff. The bottom edge of the cuff should be 1 inch above the crease of the elbow. Ideally, take 3 measurements at one sitting and record the average.  *If you need a refill on your cardiac medications before your next appointment, please call your pharmacy*   Lab Work: FASTING NMR lipoprofile   If you have labs (blood work) drawn today and your tests are completely normal, you will receive your results only by: MyChart Message (if you have MyChart) OR A paper copy in the mail If you have any lab test that is abnormal or we need to change your treatment, we will call you to review the results.   Follow-Up: At [facility], you and your health needs are our priority.  As part of our continuing mission to provide you with exceptional heart care, we have created designated Provider Care Teams.  These Care Teams include your primary Cardiologist (physician) and Advanced Practice Providers (APPs -  Physician Assistants and Nurse Practitioners) who all work together to provide you with the care you need, when you need it.  We recommend signing up for the patient portal called "MyChart".  Sign up information is provided on this After Visit Summary.  MyChart is used to connect with patients for Virtual  Visits (Telemedicine).  Patients are able to view lab/test results, encounter notes, upcoming appointments, etc.  Non-urgent messages can be sent to your provider as well.   To learn more about what you can do with MyChart, go to [email or web address].    Your next appointment:    6 months with Dr. Rennis Golden

## 2023-12-12 LAB — NMR, LIPOPROFILE
Cholesterol, Total: 120 mg/dL (ref 100–199)
HDL Particle Number: 31 umol/L
HDL-C: 43 mg/dL
LDL Particle Number: 471 nmol/L
LDL Size: 20.2 nm — ABNORMAL LOW
LDL-C (NIH Calc): 49 mg/dL (ref 0–99)
LP-IR Score: 59 — ABNORMAL HIGH
Small LDL Particle Number: 292 nmol/L
Triglycerides: 171 mg/dL — ABNORMAL HIGH (ref 0–149)

## 2023-12-27 ENCOUNTER — Other Ambulatory Visit: Payer: Self-pay | Admitting: Internal Medicine

## 2023-12-31 ENCOUNTER — Other Ambulatory Visit: Payer: Self-pay | Admitting: Internal Medicine

## 2024-01-01 NOTE — Progress Notes (Signed)
 Cardiology Office Note    Date:  01/02/2024  ID:  Jonathan [name], DOB 1951-12-05, MRN [id number] PCP:  Marvine Rush, MD  Cardiologist:  Vinie JAYSON Maxcy, MD  Electrophysiologist:  None   Chief Complaint: Hypertension   History of Present Illness: Jonathan [name]    Jonathan [name] is a 73 y.o. male with visit-pertinent history of CAD s/p numerous prior stents to the LAD and RCA, s/p CABG x 1 in 2009, DES to ostial LCx in 2023, PAD s/p iliac and femoral artery stenting in 2003, hypertension, hyperlipidemia, GERD and tobacco use.  He presents today regarding hypertension.  Previously followed by Dr. Maye.  He has extensive history of CAD as noted above, he underwent brachytherapy to the LAD.  He developed occlusion of the right coronary artery and ultimately underwent a right free radial graft to the PDA by Dr. Lucas in 2009.  He had an abnormal stress test in September 2023, cardiac catheterization in 10/2022 revealed 80% ostial circumflex occlusion s/p DES, patent RPDA graft.  In 07/2023 he had increased chest pain, underwent LHC with failed PCI.  He had severe ISR of the ostial LCx that was not amenable to PCI after multiple attempts and multiple balloons that ruptured at low inflation pressures, unsuccessful shockwave lithotripsy.  On 09/03/2023 he underwent two-vessel bypass grafting with LIMA to OM1 and SVG to OM 2.  His postoperative course was complicated by atrial fibrillation and treated with amiodarone , this was later discontinued by Dr. Lucas due to severe nausea.  He was continued on Eliquis  due to recurrent episodes.   He was seen in clinic by Dr. Maxcy on 12/10/2023, he had remained stable from a cardiac perspective at that point.  Dr. Maxcy noted that he favored continuing Eliquis  long-term and his aspirin  was discontinued.  It was noted that his blood pressure was slightly elevated at visit however he had not taking his morning medications, he was instructed to monitor his blood pressure and  notify the office if consistently elevated.  Today he presents regarding hypertension.  He reports that his blood pressure at home has been consistently elevated in the 160s/60s following his morning medications.  He notes that he self increased his telmisartan  to 80 mg daily a few weeks ago but has not noticed any changes.  He denies headaches, chest pain, shortness of breath, palpitations, lower extremity edema, presyncope or syncope.  He reports adherence with his other medications although he is confused on which medications he taking.  He is unsure if he has been regularly taking rosuvastatin  40 mg daily or 10 mg daily.  ROS: .   Today he denies chest pain, shortness of breath, lower extremity edema, fatigue, palpitations, melena, hematuria, hemoptysis, diaphoresis, weakness, presyncope, syncope, orthopnea, and PND.  All other systems are reviewed and otherwise negative. Studies Reviewed: Jonathan [name]    EKG:  EKG is not ordered today.   CV Studies:  Cardiac Studies & Procedures   CARDIAC CATHETERIZATION  CARDIAC CATHETERIZATION 08/17/2023  Narrative   Ost RCA to Dist RCA lesion is 100% stenosed.   Ost Cx lesion is 90% stenosed.   Scoring balloon angioplasty was performed using a BALLN WOLVERINE 3.00X10.   Post intervention, there is a 90% residual stenosis.   Right radial artery and is normal in caliber.   The graft exhibits no disease.   There is no competitive flow   LV end diastolic pressure is mildly elevated.  2 vessel obstructive CAD. Severe in stent restenosis at the  ostium of the LCx. Chronic occlusion of the RCA. Continued patency of the stent in the proximal LAD Patent free radial graft to the PDA Mildly elevated LVEDP 18 mm Hg Unsuccessful PCI of the ostium of the LCx. Unable to expand lesion due to the fact that multiple balloons ruptured in the lesion at low inflation pressures. Unsuccessful Shockwave lithotripsy.  Plan: PCI option very limited at this point. Could consider  Rotational atherectomy which may possible smooth out whatever is causing balloon rupture. However, rotational atherectomy  is off label for in stent disease and even if lesion could eventually be expanded restenosis rate will be higher. The other option to consider would be CABG.  Findings Coronary Findings Diagnostic  Dominance: Right  Left Main Vessel is normal in caliber.  Left Anterior Descending The vessel exhibits minimal luminal irregularities.  First Diagonal Branch Vessel is small in size.  Second Diagonal Branch Vessel is small in size.  Third Diagonal Branch Vessel is small in size.  Ramus Intermedius Vessel is small.  Left Circumflex Vessel is moderate in size. There is mild diffuse disease throughout the vessel. Ost Cx lesion is 90% stenosed. Vessel is the culprit lesion. The lesion is located at the bifurcation and eccentric. Fibrotic The lesion was previously treated using a drug eluting stent between 6-12 months ago.  Second Obtuse Marginal Branch Vessel is small in size.  Left Posterior Atrioventricular Artery Vessel is small in size.  Right Coronary Artery Vessel is normal in caliber. There is severe diffuse disease throughout the vessel. Ost RCA to Dist RCA lesion is 100% stenosed. The lesion was previously treated using a bare metal stent and a drug eluting stent over 2 years ago.  Right Posterior Descending Artery Vessel is small in size.  Right Posterior Atrioventricular Artery Vessel is small in size.  First Right Posterolateral Branch Vessel is small in size.  Second Right Posterolateral Branch Vessel is small in size.  Right Radial Artery Graft To RPDA Right radial artery and is normal in caliber.  The graft exhibits no disease. There is no competitive flow  Intervention  Ost Cx lesion Angioplasty CATH LAUNCHER 6FR EBU3.5 guide catheter was inserted. WIRE ASAHI PROWATER 180CM guidewire used to cross lesion. Scoring balloon angioplasty  was performed using a BALLN WOLVERINE 3.00X10. Post-Intervention Lesion Assessment The intervention was unsuccessful due to lesion rigidity. Pre-interventional TIMI flow is 3. Post-intervention TIMI flow is 3. No complications occurred at this lesion. There is a 90% residual stenosis post intervention.   CARDIAC CATHETERIZATION  CARDIAC CATHETERIZATION 11/08/2022  Narrative   Ost Cx lesion is 80% stenosed.   Scoring balloon angioplasty was performed using a BALLN SCOREFLEX 2.75X10.   A drug-eluting stent was successfully placed using a SYNERGY XD 2.75X16.  Deployed to 2.9 mm and postoperative 3.1 mm at the ostium and proximal lesion.   Post intervention, there is a 5% residual stenosis.   ------------------------------------------------   Minimal diffuse disease in the LAD and very small caliber Ramus   Ost RCA to Dist RCA lesion is 100% stenosed. ->  Extensive stents completely stenosed.  Flush occlusion.   Right Radial Artery graft (Rad-rPDA) was visualized by angiography and is normal in caliber. The graft exhibits no disease. There is no competitive flow   ------------------------------------------------   The left ventricular systolic function is normal.  The left ventricular ejection fraction is 55-65% by visual estimate.   LV end diastolic pressure is normal.  There is no aortic valve stenosis.  POSTPROCEDURE DIAGNOSES CULPRIT  LESION: Ostial LCx 80% Successful ScoreFlex Angioplasty with DES PCI using Synergy XD 2.75 mm x 16 mm - deployed to 3.1 mm Lesion reduced to roughly 5% at the ostium but otherwise no residual stenosis, with TIMI-3 flow pre and post. 100% CTO of Native RCA with multiple overlapping stents from ostial to distal Widely patent R Radial-rPDA widely patent with antegrade filling of the downstream PDA along with retrograde filling of the RPAV and 2 PL branches Normal LV EF by LV gram, with normal LVEDP.   RECOMMENDATIONS Continue home medications for CAD  management. DAPT for minimum of 6 months, then would continue Plavix  long-term based on ostial stent Same-day discharge and follow-up with Dr. Mona Alm Clay, MD  Findings Coronary Findings Diagnostic  Dominance: Right  Left Main Vessel was injected. Vessel is normal in caliber.  Left Anterior Descending The vessel exhibits minimal luminal irregularities.  First Diagonal Branch Vessel is small in size.  Second Diagonal Branch Vessel is small in size.  Third Diagonal Branch Vessel is small in size.  Ramus Intermedius Vessel is small.  Left Circumflex Vessel is moderate in size. There is mild diffuse disease throughout the vessel. Ost Cx lesion is 80% stenosed. Vessel is the culprit lesion. The lesion is located at the bifurcation and eccentric. Fibrotic  Second Obtuse Marginal Branch Vessel is small in size.  Left Posterior Atrioventricular Artery Vessel is small in size.  Right Coronary Artery Vessel was injected. Vessel is normal in caliber. There is severe diffuse disease throughout the vessel. Ost RCA to Dist RCA lesion is 100% stenosed. The lesion was previously treated using a bare metal stent and a drug eluting stent over 2 years ago.  Right Posterior Descending Artery Vessel is small in size.  Right Posterior Atrioventricular Artery Vessel is small in size.  First Right Posterolateral Branch Vessel is small in size.  Second Right Posterolateral Branch Vessel is small in size.  Right Radial Artery Graft To RPDA Right radial artery graft was visualized by angiography and is normal in caliber.  The graft exhibits no disease. There is no competitive flow  Intervention  Ost Cx lesion Angioplasty Lesion length:  14 mm. CATH VISTA GUIDE 6FR XB3.5 guide catheter was inserted. WIRE ASAHI PROWATER 180CM guidewire used to cross lesion. Scoring balloon angioplasty was performed using a BALLN SCOREFLEX 2.75X10. Maximum pressure: 14 atm. Inflation time:  20 sec. Stent Lesion length:  16 mm. Lesion crossed with guidewire. Pre-stent angioplasty was performed using a BALLN EMERGE MR 2.5X12. Maximum pressure:  14 atm. Inflation time:  20 sec. Prior to ScoreFlex A drug-eluting stent was successfully placed using a SYNERGY XD 2.75X16. Maximum pressure: 16 atm. Inflation time: 30 sec. Stent strut is well apposed. Post-stent angioplasty was performed using a BALL SAPPHIRE NC24 3.0X12. Maximum pressure:  18 atm. Post-Intervention Lesion Assessment The intervention was successful. Pre-interventional TIMI flow is 3. Post-intervention TIMI flow is 3. Treated lesion length:  16 mm. No complications occurred at this lesion. There is a 5% residual stenosis post intervention.   STRESS TESTS  MYOCARDIAL PERFUSION IMAGING 08/30/2022  Narrative   Findings are equivocal, suggestive of possible diaphragmatic attenuation, but also concerning for ischemia. The study is intermediate risk.   No ST deviation was noted.   LV perfusion is abnormal. Defect 1: There is a medium defect with moderate reduction in uptake present in the apical to basal inferior location(s) that is partially reversible. There is normal wall motion in the defect area. Consistent  with artifact caused by subdiaphragmatic activity.   Left ventricular function is normal. Nuclear stress EF: 71 %. The left ventricular ejection fraction is hyperdynamic (>65%). End diastolic cavity size is normal. End systolic cavity size is normal.   Prior study available for comparison from 08/19/2013. No changes compared to prior study. No ischemia, bowel attenuation artifact, normal LVEF at 66%  Large, reversible basal to apical inferior perfusion defect, which partially improves with stress upright imaging, suggestive of a component of diaphragmatic attenuation artifact. Cannot exclude reversible ischemia. LVEF 71% with normal wall motion. This is an intermediate risk study. Consider further ischemic evaluation if  warranted.  ECHOCARDIOGRAM  ECHOCARDIOGRAM COMPLETE 08/31/2023  Narrative ECHOCARDIOGRAM REPORT    Patient Name:   Jonathan [name] Date of Exam: 08/31/2023 Medical Rec #:  [id number]      Height:       67.0 in Accession #:    [id number]     Weight:       180.0 lb Date of Birth:  [birth year]      BSA:          1.934 m Patient Age:    72 years       BP:           149/57 mmHg Patient Gender: M              HR:           65 bpm. Exam Location:  Outpatient  Procedure: 2D Echo, Color Doppler and Cardiac Doppler  Indications:    Pre CABG  History:        Patient has no prior history of Echocardiogram examinations. Previous Myocardial Infarction and CAD, Prior CABG; Risk Factors:Former Smoker.  Sonographer:    Jayson Gaskins Referring Phys: [id number] BRYAN K BARTLE  IMPRESSIONS   1. Left ventricular ejection fraction, by estimation, is 55 to 60%. The left ventricle has normal function. The left ventricle has no regional wall motion abnormalities. Left ventricular diastolic parameters were normal. 2. Right ventricular systolic function is normal. The right ventricular size is normal. 3. Right atrial size was mildly dilated. 4. The mitral valve is abnormal. Trivial mitral valve regurgitation. No evidence of mitral stenosis. 5. The aortic valve is tricuspid. Aortic valve regurgitation is not visualized. No aortic stenosis is present. 6. The inferior vena cava is normal in size with greater than 50% respiratory variability, suggesting right atrial pressure of 3 mmHg.  FINDINGS Left Ventricle: Left ventricular ejection fraction, by estimation, is 55 to 60%. The left ventricle has normal function. The left ventricle has no regional wall motion abnormalities. The left ventricular internal cavity size was normal in size. There is no left ventricular hypertrophy. Left ventricular diastolic parameters were normal.  Right Ventricle: The right ventricular size is normal. No increase in right ventricular  wall thickness. Right ventricular systolic function is normal.  Left Atrium: Left atrial size was normal in size.  Right Atrium: Right atrial size was mildly dilated.  Pericardium: There is no evidence of pericardial effusion.  Mitral Valve: The mitral valve is abnormal. There is mild thickening of the mitral valve leaflet(s). Trivial mitral valve regurgitation. No evidence of mitral valve stenosis.  Tricuspid Valve: The tricuspid valve is normal in structure. Tricuspid valve regurgitation is mild . No evidence of tricuspid stenosis.  Aortic Valve: The aortic valve is tricuspid. Aortic valve regurgitation is not visualized. No aortic stenosis is present. Aortic valve mean gradient measures 2.0 mmHg. Aortic valve peak gradient measures 4.3  mmHg. Aortic valve area, by VTI measures 2.83 cm.  Pulmonic Valve: The pulmonic valve was normal in structure. Pulmonic valve regurgitation is not visualized. No evidence of pulmonic stenosis.  Aorta: The aortic root is normal in size and structure.  Venous: The inferior vena cava is normal in size with greater than 50% respiratory variability, suggesting right atrial pressure of 3 mmHg.  IAS/Shunts: No atrial level shunt detected by color flow Doppler.   LEFT VENTRICLE PLAX 2D LVIDd:         4.80 cm   Diastology LVIDs:         3.30 cm   LV e' medial:    9.36 cm/s LV PW:         0.90 cm   LV E/e' medial:  7.7 LV IVS:        1.00 cm   LV e' lateral:   11.60 cm/s LVOT diam:     2.00 cm   LV E/e' lateral: 6.2 LV SV:         71 LV SV Index:   37 LVOT Area:     3.14 cm   RIGHT VENTRICLE RV S prime:     12.20 cm/s TAPSE (M-mode): 1.8 cm  LEFT ATRIUM           Index        RIGHT ATRIUM           Index LA Vol (A2C): 49.0 ml 25.34 ml/m  RA Area:     18.20 cm LA Vol (A4C): 59.9 ml 30.98 ml/m  RA Volume:   60.50 ml  31.29 ml/m AORTIC VALVE AV Area (Vmax):    2.60 cm AV Area (Vmean):   2.68 cm AV Area (VTI):     2.83 cm AV Vmax:            104.00 cm/s AV Vmean:          75.100 cm/s AV VTI:            0.250 m AV Peak Grad:      4.3 mmHg AV Mean Grad:      2.0 mmHg LVOT Vmax:         86.20 cm/s LVOT Vmean:        64.100 cm/s LVOT VTI:          0.225 m LVOT/AV VTI ratio: 0.90  AORTA Ao Asc diam: 2.90 cm  MITRAL VALVE MV Area (PHT): 2.90 cm    SHUNTS MV Decel Time: 262 msec    Systemic VTI:  0.22 m MV E velocity: 71.70 cm/s  Systemic Diam: 2.00 cm MV A velocity: 64.50 cm/s MV E/A ratio:  1.11  Maude Emmer MD Electronically signed by Maude Emmer MD Signature Date/Time: 08/31/2023/1:47:47 PM    Final  TEE  ECHO INTRAOPERATIVE TEE 09/03/2023  Narrative *INTRAOPERATIVE TRANSESOPHAGEAL REPORT *    Patient Name:   Jonathan [name] [facility] Date of Exam: 09/03/2023 Medical Rec #:  [id number]      Height:       68.0 in Accession #:    [id number]     Weight:       175.0 lb Date of Birth:  1951/11/06      BSA:          1.93 m Patient Age:    72 years       BP:           ./. mmHg Patient Gender: M  HR:           62 bpm. Exam Location:  Anesthesiology  Transesophogeal exam was perform intraoperatively during surgical procedure. Patient was closely monitored under general anesthesia during the entirety of examination.  Indications:     CAD Performing Phys: [id number] DORISE MARLA FELLERS Diagnosing Phys: Elsie Needle MD  Complications: No known complications during this procedure. POST-OP IMPRESSIONS Overall, there were no significant changes from pre-bypass.  PRE-OP FINDINGS Left Ventricle: The left ventricle has normal systolic function, with an ejection fraction of 55-60%. The cavity size was normal. There is mildly increased left ventricular wall thickness. No evidence of left ventricular regional wall motion abnormalities.   Right Ventricle: The right ventricle has normal systolic function. The cavity was normal. There is no increase in right ventricular wall thickness.  Left Atrium: Left atrial size was not  assessed. No left atrial/left atrial appendage thrombus was detected.  Right Atrium: Right atrial size was not assessed.  Interatrial Septum: No atrial level shunt detected by color flow Doppler.  Pericardium: There is no evidence of pericardial effusion.  Mitral Valve: The mitral valve is normal in structure. Mitral valve regurgitation is trivial by color flow Doppler.  Tricuspid Valve: The tricuspid valve was normal in structure. Tricuspid valve regurgitation is mild by color flow Doppler.  Aortic Valve: The aortic valve is normal in structure. Aortic valve regurgitation was not visualized by color flow Doppler.   Pulmonic Valve: The pulmonic valve was normal in structure. Pulmonic valve regurgitation is not visualized by color flow Doppler.   +--------------+----------++ MITRAL VALVE             +--------------+----------++ MV Area (PHT):3.72 cm   +--------------+----------++ MV PHT:       59.16 msec +--------------+----------++ MV Decel Time:204 msec   +--------------+----------++ +--------------+----------++ MV E velocity:45.50 cm/s +--------------+----------++ MV A velocity:36.20 cm/s +--------------+----------++ MV E/A ratio: 1.26       +--------------+----------++   Elsie Needle MD Electronically signed by Elsie Needle MD Signature Date/Time: 09/03/2023/3:30:43 PM    Final            Current Reported Medications:.    Current Meds  Medication Sig   apixaban  (ELIQUIS ) 5 MG TABS tablet TAKE 1 TABLET BY MOUTH 2 TIMES DAILY.   esomeprazole  (NEXIUM  24HR) 20 MG capsule Take 20 mg by mouth daily at 12 noon.   ezetimibe  (ZETIA ) 10 MG tablet Take 1 tablet (10 mg total) by mouth daily. Take 1 tablet by mouth daily.   metoprolol  succinate (TOPROL -XL) 50 MG 24 hr tablet TAKE 1 TABLET BY MOUTH EVERY DAY WITH OR IMMEDIATELY FOLLOWING A MEAL.   rosuvastatin  (CRESTOR ) 40 MG tablet Take 1 tablet (40 mg total) by mouth daily.    tamsulosin  (FLOMAX ) 0.4 MG CAPS capsule Take 1 capsule (0.4 mg total) by mouth daily after supper.   [DISCONTINUED] telmisartan  (MICARDIS ) 40 MG tablet Take 1 tablet (40 mg total) by mouth daily.   [DISCONTINUED] telmisartan  (MICARDIS ) 80 MG tablet Take 80 mg by mouth daily.   Physical Exam:    VS:  BP 128/62   Pulse 69   Ht 5' 8 (1.727 m)   Wt 179 lb (81.2 kg)   SpO2 94%   BMI 27.22 kg/m    Wt Readings from Last 3 Encounters:  01/02/24 179 lb (81.2 kg)  12/10/23 177 lb (80.3 kg)  10/03/23 176 lb (79.8 kg)    GEN: Well nourished, well developed in no acute distress NECK: No JVD; No carotid bruits CARDIAC:  RRR, no murmurs, rubs, gallops RESPIRATORY:  Clear to auscultation without rales, wheezing or rhonchi  ABDOMEN: Soft, non-tender, non-distended EXTREMITIES:  No edema; No acute deformity   Asessement and Plan:.   HTN: Initial blood pressure today 135/65, on recheck was 128/62.  Patient reports that his blood pressures at home have been consistently elevated with systolics in the 160s.  He reports that he increased his telmisartan  to 80 mg daily a few weeks ago but did not notice a change in his blood pressure readings.  Question if his blood pressure machine is possibly off given blood pressure readings in office today.  Patient notes that when he checks his wife's blood pressure it also reads high for her and her blood pressure is typically well-controlled.  Recommended he obtain a new blood pressure cuff and monitor his blood pressure at home for the next 2 weeks.  He will notify the office if consistently elevated above 130/80.  Continue telmisartan  80 mg daily. Check BMET today.   CAD: S/p numerous prior stents to the LAD and RCA, s/p CABG x 1 in 2009, DES to ostial LCx in 2023. On 09/03/2023 he underwent two-vessel bypass grafting with LIMA to OM1 and SVG to OM 2. Stable with no anginal symptoms. No indication for ischemic evaluation.  No longer on aspirin  per Dr. Mona, continue  Eliquis  5 mg twice daily.  Continue Eliquis  5 mg twice daily, Zetia  10 mg daily, metoprolol  succinate 50 mg daily, rosuvastatin  40 mg daily and telmisartan  80 mg daily.  Hyperlipidemia: Last lipid profile on 12/10/2023 indicated total cholesterol 120, HDL 43, triglycerides 171 and LDL 49. Patient unsure if he was taking Rosuvastatin  40 mg dialy or 10 daily.  He is currently taking rosuvastatin  40 mg daily. Will check fasting lipid fasting lipid and LFTs in 2 months   Atrial fibrillation: Developed postoperatively following CABG in 08/2023.  Was on amiodarone  until discontinued on follow-up by Dr. Lucas for increased nausea. Today he denies any feelings of palpitations or increased heart rate.  He denies any bleeding problems on Eliquis .  Continue Eliquis  5 mg twice daily and metoprolol  succinate 50 mg daily.     Disposition: F/u with Dr. Mona in six months or sooner if needed.   Signed, Braileigh Landenberger D Iseah Plouff, NP

## 2024-01-01 NOTE — Telephone Encounter (Signed)
 Prescription refill request for Eliquis received. Indication:pad Last office visit:12/24 Scr:1.09  9/24 Age: 73 Weight:80.3  kg  Prescription refilled

## 2024-01-02 ENCOUNTER — Ambulatory Visit: Payer: PPO | Attending: Cardiology | Admitting: Cardiology

## 2024-01-02 ENCOUNTER — Encounter: Payer: Self-pay | Admitting: Cardiology

## 2024-01-02 VITALS — BP 128/62 | HR 69 | Ht 68.0 in | Wt 179.0 lb

## 2024-01-02 DIAGNOSIS — I2510 Atherosclerotic heart disease of native coronary artery without angina pectoris: Secondary | ICD-10-CM | POA: Diagnosis not present

## 2024-01-02 DIAGNOSIS — E785 Hyperlipidemia, unspecified: Secondary | ICD-10-CM | POA: Diagnosis not present

## 2024-01-02 DIAGNOSIS — Z951 Presence of aortocoronary bypass graft: Secondary | ICD-10-CM

## 2024-01-02 DIAGNOSIS — I10 Essential (primary) hypertension: Secondary | ICD-10-CM | POA: Diagnosis not present

## 2024-01-02 DIAGNOSIS — I480 Paroxysmal atrial fibrillation: Secondary | ICD-10-CM

## 2024-01-02 MED ORDER — TELMISARTAN 80 MG PO TABS: 80.0000 mg | ORAL_TABLET | Freq: Every day | ORAL | 3 refills | Status: DC

## 2024-01-02 MED ORDER — ROSUVASTATIN CALCIUM 40 MG PO TABS: 40.0000 mg | ORAL_TABLET | Freq: Every day | ORAL | 3 refills | Status: DC

## 2024-01-02 NOTE — Patient Instructions (Signed)
 Medication Instructions:  No changes *If you need a refill on your cardiac medications before your next appointment, please call your pharmacy*  Lab Work: Today we will draw Bmet In 2 months we will draw fasting lipid pane and LFT's If you have labs (blood work) drawn today and your tests are completely normal, you will receive your results only by: MyChart Message (if you have MyChart) OR A paper copy in the mail If you have any lab test that is abnormal or we need to change your treatment, we will call you to review the results.  Testing/Procedures: No testing  Follow-Up: At [facility], you and your health needs are our priority.  As part of our continuing mission to provide you with exceptional heart care, we have created designated Provider Care Teams.  These Care Teams include your primary Cardiologist (physician) and Advanced Practice Providers (APPs -  Physician Assistants and Nurse Practitioners) who all work together to provide you with the care you need, when you need it.  We recommend signing up for the patient portal called MyChart.  Sign up information is provided on this After Visit Summary.  MyChart is used to connect with patients for Virtual Visits (Telemedicine).  Patients are able to view lab/test results, encounter notes, upcoming appointments, etc.  Non-urgent messages can be sent to your provider as well.   To learn more about what you can do with MyChart, go to [email or web address].    Your next appointment:   June 18th at 9:00 am with Vinie JAYSON Maxcy, MD     Other Instructions: Keep blood pressure log at home for the next 2 days if blood pressure in consistently above 130/80 please call our office.

## 2024-01-03 LAB — BASIC METABOLIC PANEL
BUN/Creatinine Ratio: 13 (ref 10–24)
BUN: 16 mg/dL (ref 8–27)
CO2: 24 mmol/L (ref 20–29)
Calcium: 9.4 mg/dL (ref 8.6–10.2)
Chloride: 101 mmol/L (ref 96–106)
Creatinine, Ser: 1.24 mg/dL (ref 0.76–1.27)
Glucose: 130 mg/dL — ABNORMAL HIGH (ref 70–99)
Potassium: 4.9 mmol/L (ref 3.5–5.2)
Sodium: 138 mmol/L (ref 134–144)
eGFR: 62 mL/min/{1.73_m2} (ref >59)

## 2024-01-30 ENCOUNTER — Other Ambulatory Visit (HOSPITAL_COMMUNITY): Payer: Self-pay | Admitting: Family Medicine

## 2024-01-30 ENCOUNTER — Encounter (HOSPITAL_COMMUNITY): Payer: Self-pay | Admitting: Family Medicine

## 2024-01-30 DIAGNOSIS — Z87891 Personal history of nicotine dependence: Secondary | ICD-10-CM

## 2024-02-22 LAB — LIPID PANEL
Chol/HDL Ratio: 2.3 {ratio} (ref 0.0–5.0)
Cholesterol, Total: 77 mg/dL — ABNORMAL LOW (ref 100–199)
HDL: 33 mg/dL — ABNORMAL LOW (ref >39)
LDL Chol Calc (NIH): 22 mg/dL (ref 0–99)
Triglycerides: 122 mg/dL (ref 0–149)
VLDL Cholesterol Cal: 22 mg/dL (ref 5–40)

## 2024-02-22 LAB — HEPATIC FUNCTION PANEL
ALT: 33 [IU]/L (ref 0–44)
AST: 27 [IU]/L (ref 0–40)
Albumin: 4.1 g/dL (ref 3.8–4.8)
Alkaline Phosphatase: 75 [IU]/L (ref 44–121)
Bilirubin Total: 0.6 mg/dL (ref 0.0–1.2)
Bilirubin, Direct: 0.2 mg/dL (ref 0.00–0.40)
Total Protein: 6.7 g/dL (ref 6.0–8.5)

## 2024-03-17 ENCOUNTER — Other Ambulatory Visit (HOSPITAL_COMMUNITY): Payer: Self-pay | Admitting: Physician Assistant

## 2024-03-17 DIAGNOSIS — C4442 Squamous cell carcinoma of skin of scalp and neck: Secondary | ICD-10-CM

## 2024-06-11 ENCOUNTER — Encounter: Payer: Self-pay | Admitting: Internal Medicine

## 2024-06-11 ENCOUNTER — Ambulatory Visit: Payer: PPO | Attending: Internal Medicine | Admitting: Internal Medicine

## 2024-06-11 VITALS — BP 142/70 | HR 65 | Ht 68.0 in | Wt 184.4 lb

## 2024-06-11 DIAGNOSIS — I10 Essential (primary) hypertension: Secondary | ICD-10-CM | POA: Diagnosis not present

## 2024-06-11 DIAGNOSIS — I480 Paroxysmal atrial fibrillation: Secondary | ICD-10-CM

## 2024-06-11 DIAGNOSIS — E785 Hyperlipidemia, unspecified: Secondary | ICD-10-CM

## 2024-06-11 DIAGNOSIS — Z951 Presence of aortocoronary bypass graft: Secondary | ICD-10-CM

## 2024-06-11 MED ORDER — AMLODIPINE BESYLATE 5 MG PO TABS: 5.0000 mg | ORAL_TABLET | Freq: Every day | ORAL | 3 refills | Status: AC

## 2024-06-11 NOTE — Progress Notes (Signed)
 OFFICE NOTE  Chief Complaint:  Follow-up  Primary Care Physician: Minus Amel, MD  HPI:  Jonathan [name] is a pleasant 73 year old male patient who was formerly followed by Dr. Ed Gondola. He has an extensive past medical history of coronary disease. He has had numerous prior stents to the right coronary artery and LAD and underwent brachii therapy to the LAD. Eventually developed occlusion of the right coronary artery and ultimately underwent a right free radial graft to the PDA by Dr. Sherene Dilling in 2009. He also has a history of peripheral arterial disease and underwent right common iliac and femoral artery stenting in 2003, but does have bilateral disease which has not been studied since 2009. In addition he has hypertension, GERD and is here today for preoperative cardiac evaluation. He's been having numbness and tingling in his hands as well as legs and is felt to have a cord problem in the neck. His neurosurgeon is Dr. Benedetta Bradley and was planning on operating yesterday however he was noted to have an abnormal EKG and was sent for preoperative evaluation. It should be noted he had a negative nuclear stress test for ischemia in 07/2013. Currently denies any chest pain. He is able to exert himself even walking up stairs without shortness of breath but is having problems with numbness and tingling in his legs as well as cool extremities.  Jonathan [name] returns today for follow-up. He underwent lumbar surgery by Dr. Benedetta Bradley and he reports he still has problems with leg pain and cramping. He denies any chest pain or worsening shortness of breath. Blood pressure is well-controlled today.  11/23/2016  Jonathan [name] returns today for follow-up. He is generally without complaints. He reports that he is taking 10 mg of Crestor  every other day due to some leg pains. He says this is improved his symptoms somewhat. Cholesterol however is not at goal. EKG shows normal sinus rhythm. He has no chest pain or shortness of  breath. He continues to say that he's had no significant improvement in his back pain with back surgery.  11/23/2017  Jonathan [name] was seen today in follow-up.  Overall he seems to be doing well.  He reports some occasional lightheadedness but no chest pain or worsening shortness of breath.  He reports some fatigue in his legs but no significant claudication.  He did have some mild PAD in 2015 which was unchanged since 2009.  He is also overdue for a lipid profile.  He has not followed up with his primary care provider except for when he gets sick.  02/16/2021  Jonathan [name] returns today for follow-up.  Overall he seems to be doing well.  He had [name] seen by Ervin Heath, PA-C.  He had describes some intermittent chest pain however he says he is had none since then.  He also has a history of some PAD.  He had Dopplers back in 2020, but denies any claudication.  He says he plays golf is fairly active.  He is recently doing some bowling.  He had lipids reassessed in October which showed triglycerides were normal at 172.  He is not currently taking Vascepa , but is on ezetimibe  and rosuvastatin .  Blood pressure is elevated today however he chalked it up to going out bowling [name] night and drinking probably too much alcohol.  11/07/2022  Jonathan [name] is seen today in follow-up.  He recently had seen Slater Duncan, NP, for concerns of chest pain.  He underwent a Myoview  stress test which  I interpreted back in September.  This showed a large, reversible basal to apical inferior perfusion defect that partially improves with upright imaging.  I felt that there might be a component of attenuation artifact however could not rule out reversible ischemia.  Initial medical therapy was recommended and he was started on Ranexa  in addition to Imdur .  He has had some minimal relief with this but has had to take nitro least 5 times since then.  He also notes that he was trying to cut down a tree limb with a hand saw and developed  chest pain and had to stop.  He has since then avoided any significant physical activity.  11/27/2022  Jonathan [name] is seen today in follow-up.  He underwent left heart catheterization on November 08, 2022 which showed an 80% ostial circumflex lesion.  This was successfully stented with a drug-eluting stent and he has noted no issues since then.  Of note his blood pressure remains elevated today 152/72.  His home blood pressure readings are elevated as well.  He is also overdue for a lipid profile.  12/10/2023  Jonathan [name] is seen today in follow-up.  Unfortunately he has had progressive coronary artery disease and underwent recent repeat coronary artery bypass grafting x 2 in September.  He has done well with that and denies any symptoms at this point.  He had perioperative atrial fibrillation.  This was new.  He was started on amiodarone  but had significant nausea with it and has since stopped it.  This was at the direction of Dr. Sherene Dilling.  G today shows he is in sinus rhythm.  His blood pressure was somewhat elevated 166/66.  Lipids in August showed a elevated triglycerides 178.  He has not had them reassessed.  He is on telmisartan  and metoprolol  for blood pressure but had not taken his medicines today.  He says his home blood pressure readings are typically in the 140 systolic but he is never seen blood pressures of 120.  06/11/2024  Jonathan [name] is seen today for follow-up.  He saw Idella Major, NP in January.  That time his blood pressure was still somewhat elevated.  It was unclear if he was correctly taking his cholesterol medications.  He had been prescribed both high-dose rosuvastatin  40 mg daily and ezetimibe  10 mg daily.  Recent labs however show significant reduction in his lipids now total cholesterol 77, triglycerides 122, HDL 33 and LDL 22.  He does get some infrequent leg cramps.  His goal LDL is less than 55.  He says his home blood pressure readings have been fairly stable around 140 systolic.   Today was 142/70.  He denies any anginal symptoms.  He denies any recurrent atrial fibrillation.  PMHx:  Past Medical History:  Diagnosis Date   Anemia    CAD (coronary artery disease)    Femoral artery stenosis (HCC)    Gastroesophageal reflux    H/O hiatal hernia    Hyperlipidemia    Hypertension    Myocardial infarction (HCC) 11/24/2001   Neuromuscular disorder (HCC)    Bilateral Leg Neuropathy   PAD (peripheral artery disease) (HCC)    bilateral iliac artery stents    PONV (postoperative nausea and vomiting)    most of the time he gets nauseated   Prostate hyperplasia with urinary obstruction    S/P CABG x 1 12/26/2007   R radial to PDA   Tobacco consumption    quit cigarettes in 2008, smokes 5-6 cigars/week  Past Surgical History:  Procedure Laterality Date   ANTERIOR CERVICAL DECOMP/DISCECTOMY FUSION N/A 10/22/2014   Procedure: CERVICAL SIX SEVEN ANTERIOR CERVICAL DECOMPRESSION/DISCECTOMY FUSION  ;  Surgeon: Audie Bleacher, MD;  Location: [facility];  Service: Neurosurgery;  Laterality: N/A;  C67 Possible C7T1 anterior cervical decompression with fusion plating and bonegraft   BACK SURGERY     CARDIAC CATHETERIZATION  12/03/2001   2.5x92mm S-660 stent to RCA (MI)   CARDIAC CATHETERIZATION  05/06/2002   PTCA & stenting of LAD and PTCA & stenting of mid/distal RCA with 3.0x72mm Zeta stent   CARDIAC CATHETERIZATION  06/03/2002   LAD with prox 30% in-stent restenosis; ramus with 95% ostial lesion; L Cfx & 3-OMs free of disease; mild 30% RCA narrowing & 20-30% in-stent restenosis in RCA (Dr. Dina Francisco)   CARDIAC CATHETERIZATION  03/24/2003   overlapping tandem DES 3.0x23 Cypher stent to mid RCA for in-stent restenosis & new lesion beyond previous stent with 3.0x12 SciMed TAXUS (Dr. Savilla Curls)   CARDIAC CATHETERIZATION  12/08/2003   3.0x24 TAXUS overlapping 3.0x72mm TAXUS DES in RCA and cuttin balloon atherectomy (Dr. Savilla Curls)   CARDIAC CATHETERIZATION  07/10/2007    normal L main, L Cfx & 3-OMs free of disease; LAD with Zeta stent in prox region (patent); RCA with multiple stents (3.0x12 Taxus in ostium with 3.0x20 Taxus overlapping it, and series of 3.0x24 Tacus and Cypher stent overlapping this, and in distal RC 2x5x46mm Zeta(?) stent; ostium of RCA with 80% narrowing - 3x15 cutting balloon - 80% to patient w/NO obstruction (Dr. Alana Hoyle. Little)   CARDIAC CATHETERIZATION  06/08/2008   high-grade osital & prox in-stent RCA restenosis - Dr. Savilla Curls   COLONOSCOPY N/A 11/22/2015   Procedure: COLONOSCOPY;  Surgeon: Suzette Espy, MD;  Location: [facility];  Service: Endoscopy;  Laterality: N/A;  100   CORONARY ARTERY BYPASS GRAFT  2009   free right radial to PDA for progressive RCA disease & multiple re-stenosis (Dr. Cherylynn Cosier)   CORONARY ARTERY BYPASS GRAFT N/A 09/03/2023   Procedure: REDO CORONARY ARTERY BYPASS GRAFTING (CABG) x2, USING LEFT INTERNAL MAMMARY ARTERY,LEFT LEG GREATER SAPHENEOUS VEIN HARVESTED ENDOSCOPICLY;  Surgeon: Bartley Lightning, MD;  Location: [facility] OR;  Service: Open Heart Surgery;  Laterality: N/A;   CORONARY BALLOON ANGIOPLASTY N/A 08/17/2023   Procedure: CORONARY BALLOON ANGIOPLASTY;  Surgeon: Swaziland, Peter M, MD;  Location: [facility] INVASIVE CV LAB;  Service: Cardiovascular;  Laterality: N/A;   CORONARY STENT INTERVENTION N/A 11/08/2022   Procedure: CORONARY STENT INTERVENTION;  Surgeon: Arleen Lacer, MD;  Location: [facility] INVASIVE CV LAB;  Service: Cardiovascular;  Laterality: N/A;   CORONARY ULTRASOUND/IVUS N/A 08/17/2023   Procedure: Coronary Ultrasound/IVUS;  Surgeon: Swaziland, Peter M, MD;  Location: [facility]VAS[facility] CV LAB;  Service: Cardiovascular;  Laterality: N/A;   ILIAC ARTERY STENT Bilateral 01/30/2002   9x25mm Cordis SmartStent to LCIA, 9x3.71mm Genesis stent to RCIA (Dr. Aleda Ammon)   LEFT HEART CATH AND CORS/GRAFTS ANGIOGRAPHY N/A 11/08/2022   Procedure: LEFT HEART CATH AND CORS/GRAFTS ANGIOGRAPHY;  Surgeon: Arleen Lacer, MD;  Location: [facility]  INVASIVE CV LAB;  Service: Cardiovascular;  Laterality: N/A;   LEFT HEART CATH AND CORS/GRAFTS ANGIOGRAPHY N/A 08/17/2023   Procedure: LEFT HEART CATH AND CORS/GRAFTS ANGIOGRAPHY;  Surgeon: Swaziland, Peter M, MD;  Location: [facility] INVASIVE CV LAB;  Service: Cardiovascular;  Laterality: N/A;   Lower Extremity Arterial Doppler  2009   R CIA stent - less than 50% diameter reduction; R CFA stent -  0-49% diameter reduction; R SFA less than 50% diameter reduction; L CFA, profunda, SFA - equal/less than 50% diameter reduction   LUMBAR LAMINECTOMY/DECOMPRESSION MICRODISCECTOMY Bilateral 03/18/2015   Procedure: LUMBAR LAMINECTOMY/DECOMPRESSION MICRODISCECTOMY 2 LEVELS;  Surgeon: Audie Bleacher, MD;  Location: [facility];  Service: Neurosurgery;  Laterality: Bilateral;  Bilateral L34 L45 laminectomies   NM MYOCAR PERF WALL MOTION  07/2013   bruce myoview  - intermediate risk test with abnormal inferior horizontal ST depression; mild, fixed inferobasal defect, but no ischemia   TEE WITHOUT CARDIOVERSION N/A 09/03/2023   Procedure: TRANSESOPHAGEAL ECHOCARDIOGRAM;  Surgeon: Bartley Lightning, MD;  Location: [facility] OR;  Service: Open Heart Surgery;  Laterality: N/A;   TRANSTHORACIC ECHOCARDIOGRAM  07/2013   EF 55-60%, mild LVH; mild MR; LA mildly dilated; RVSP increased; PA peak pressure (mod pulm HTN)    FAMHx:  Family History  Problem Relation Age of Onset   Heart disease Father    CAD Brother        CABG   CAD Brother        CABG   Colon cancer Neg Hx     SOCHx:   reports that he quit smoking about 21 months ago. His smoking use included cigarettes. He started smoking about 31 years ago. He has a 6 pack-year smoking history. He has never used smokeless tobacco. He reports current alcohol use of about 12.0 standard drinks of alcohol per week. He reports that he does not use drugs.  ALLERGIES:  No Known Allergies  ROS: Pertinent items noted in HPI and remainder of comprehensive ROS otherwise  negative.  HOME MEDS: Current Outpatient Medications  Medication Sig Dispense Refill   apixaban  (ELIQUIS ) 5 MG TABS tablet TAKE 1 TABLET BY MOUTH 2 TIMES DAILY. 60 tablet 5   esomeprazole  (NEXIUM  24HR) 20 MG capsule Take 20 mg by mouth daily at 12 noon.     ezetimibe  (ZETIA ) 10 MG tablet Take 1 tablet (10 mg total) by mouth daily. Take 1 tablet by mouth daily. 90 tablet 3   metoprolol  succinate (TOPROL -XL) 50 MG 24 hr tablet TAKE 1 TABLET BY MOUTH EVERY DAY WITH OR IMMEDIATELY FOLLOWING A MEAL. 90 tablet 3   rosuvastatin  (CRESTOR ) 40 MG tablet Take 1 tablet (40 mg total) by mouth daily. 90 tablet 3   tamsulosin  (FLOMAX ) 0.4 MG CAPS capsule Take 1 capsule (0.4 mg total) by mouth daily after supper.     telmisartan  (MICARDIS ) 80 MG tablet Take 1 tablet (80 mg total) by mouth daily. 90 tablet 3   No current facility-administered medications for this visit.    LABS/IMAGING: No results found for this or any previous visit (from the past 48 hours). No results found.  VITALS: BP (!) 142/70 (BP Location: Left Arm, Patient Position: Sitting)   Pulse 65   Ht 5' 8 (1.727 m)   Wt 184 lb 6.4 oz (83.6 kg)   SpO2 97%   BMI 28.04 kg/m   EXAM: General appearance: alert and no distress Neck: no carotid bruit and no JVD Lungs: clear to auscultation bilaterally Heart: regular rate and rhythm, S1, S2 normal, no murmur, click, rub or gallop Abdomen: soft, non-tender; bowel sounds normal; no masses,  no organomegaly Extremities: cool to touch, diminished pulses no edema Pulses: 1+ Skin: pale, cool, dry Neurologic: Mental status: Alert, oriented, thought content appropriate Psych: Normal  EKG: Deferred  ASSESSMENT: Unstable angina -status post left heart cath with PCI to the ostial circumflex (10/2022) Abnormal Myoview  stress test with reversible  inferior ischemia Coronary artery disease status post single vessel CABG in 2009 (free right radial to PDA) - > now status post redo two-vessel CABG  (08/2023) Numerous prior coronary interventions to the RCA and LAD with brachii therapy PAD status post right common iliac and right superficial femoral artery stents in 2003 Hypertension Dyslipidemia, goal LDL less than 55 (very high risk) PAF on Eliquis   PLAN: 1.   Mr. Joynt seems to be doing well without any recurrent atrial fibrillation or chest pain.  His blood pressure is too high however on his current therapy.  He is on max dose telmisartan  and a good dose of metoprolol .  I advised adding amlodipine 5 mg daily.  I think his cholesterol is probably better treated than it needs to be at this point.  He is on high-dose rosuvastatin  and ezetimibe .  He is likely more compliant with medication with recent LDL of 22.  I advised stopping the ezetimibe .  Will plan repeat lipids in about 3 months.  He should keep a home blood pressure log and reach out to us  to see if were reaching targets with the addition of amlodipine.  Plan follow-up with us  in 6 months or sooner as necessary.  Hazle Lites, MD, [facility], FNLA, FACP  [place]  [facility]  Medical Director of the Advanced Lipid Disorders &  Cardiovascular Risk Reduction Clinic Diplomate of the American Board of Clinical Lipidology Attending Cardiologist  Direct Dial: [phone]  Fax: [phone]  Website:  [email or web address] 06/11/2024, 9:57 AM

## 2024-06-11 NOTE — Patient Instructions (Signed)
 Medication Instructions:  Your physician has recommended you make the following change in your medication:  STOP: ezetimibe  (Zetia )  START: amlodipine (Norvasc) 5 mg by mouth once daily  *If you need a refill on your cardiac medications before your next appointment, please call your pharmacy*  Lab Work: IN 3 MONTHS: fasting lipid panel at any [employer] [employer]  If you have labs (blood work) drawn today and your tests are completely normal, you will receive your results only by: [employer] (if you have MyChart) OR A paper copy in the mail If you have any lab test that is abnormal or we need to change your treatment, we will call you to review the results.  Testing/Procedures: NONE  Follow-Up: At [facility], you and your health needs are our priority.  As part of our continuing mission to provide you with exceptional heart care, our providers are all part of one team.  This team includes your primary Cardiologist (physician) and Advanced Practice Providers or APPs (Physician Assistants and Nurse Practitioners) who all work together to provide you with the care you need, when you need it.  Your next appointment:   6 month(s)  Provider:   Hazle Lites, MD or Marlana Silvan, NP or Katlyn West, NP

## 2024-06-20 ENCOUNTER — Other Ambulatory Visit: Payer: Self-pay | Admitting: Nurse Practitioner

## 2024-08-24 ENCOUNTER — Other Ambulatory Visit: Payer: Self-pay | Admitting: Internal Medicine

## 2024-08-26 NOTE — Telephone Encounter (Signed)
 Prescription refill request for Eliquis  received. Indication:afib Last office visit:6/25 Scr:1.24  1/25 Age: 73 Weight:83.6  kg  Prescription refilled

## 2024-10-28 ENCOUNTER — Other Ambulatory Visit: Payer: Self-pay | Admitting: Physician Assistant

## 2024-10-30 NOTE — Telephone Encounter (Signed)
 Pt's pharmacy is requesting a refill on non cardiac medication tamsulosin . Would Dr. Mona like to refill this medication? Please address

## 2024-11-03 NOTE — Telephone Encounter (Signed)
 Defer to PCP, was ordered in the hospital

## 2024-11-18 ENCOUNTER — Encounter: Payer: Self-pay | Admitting: Emergency Medicine

## 2024-11-18 ENCOUNTER — Ambulatory Visit
Admission: EM | Admit: 2024-11-18 | Discharge: 2024-11-18 | Disposition: A | Discharge status: Home / Self Care | Attending: Family Medicine | Admitting: Family Medicine

## 2024-11-18 DIAGNOSIS — M109 Gout, unspecified: Secondary | ICD-10-CM

## 2024-11-18 DIAGNOSIS — R030 Elevated blood-pressure reading, without diagnosis of hypertension: Secondary | ICD-10-CM | POA: Diagnosis not present

## 2024-11-18 MED ORDER — DEXAMETHASONE SOD PHOSPHATE PF 10 MG/ML IJ SOLN
10.0000 mg | Freq: Once | INTRAMUSCULAR | Status: AC
  Administered 2024-11-18: 10 mg via INTRAMUSCULAR

## 2024-11-18 NOTE — ED Provider Notes (Signed)
 [facility]    CSN: [id number] Arrival date & time: 11/18/24  0805      History   Chief Complaint No chief complaint on file.   HPI Jonathan [name] is a 73 y.o. male.   Patient presenting today with left foot redness, swelling, pain x 5 days.  Denies injury, fevers, chills, bleeding, drainage, numbness, tingling, loss of range of motion.  History of gout and states this feels consistent with prior flareups.  Trying over-the-counter remedies with minimal relief.    Past Medical History:  Diagnosis Date   Anemia    CAD (coronary artery disease)    Femoral artery stenosis    Gastroesophageal reflux    H/O hiatal hernia    Hyperlipidemia    Hypertension    Myocardial infarction (HCC) 11/24/2001   Neuromuscular disorder (HCC)    Bilateral Leg Neuropathy   PAD (peripheral artery disease)    bilateral iliac artery stents    PONV (postoperative nausea and vomiting)    most of the time he gets nauseated   Prostate hyperplasia with urinary obstruction    S/P CABG x 1 12/26/2007   R radial to PDA   Tobacco consumption    quit cigarettes in 2008, smokes 5-6 cigars/week    Patient Active Problem List   Diagnosis Date Noted   S/P CABG x 2 09/03/2023   Unstable angina (HCC) 08/16/2023   Abnormal cardiovascular stress test 11/08/2022   Unstable angina pectoris (HCC) 11/08/2022   Mixed hyperlipidemia 11/24/2016   History of colonic polyps    Abdominal pain 11/15/2015   Lung nodule < 6cm on CT 11/15/2015   Lumbar stenosis with neurogenic claudication 03/18/2015   Low back pain 02/16/2015   HNP (herniated nucleus pulposus) with myelopathy, cervical 10/22/2014   Coronary artery disease involving native coronary artery of native heart with angina pectoris 10/08/2014   S/P CABG x 1 10/08/2014   PAD (peripheral artery disease) 10/08/2014   Essential hypertension 10/08/2014   GERD without esophagitis 10/08/2014   BPH (benign prostatic hyperplasia) 10/08/2014    Preoperative cardiovascular examination 10/08/2014    Past Surgical History:  Procedure Laterality Date   ANTERIOR CERVICAL DECOMP/DISCECTOMY FUSION N/A 10/22/2014   Procedure: CERVICAL SIX SEVEN ANTERIOR CERVICAL DECOMPRESSION/DISCECTOMY FUSION  ;  Surgeon: Rockey Peru, MD;  Location: [facility];  Service: Neurosurgery;  Laterality: N/A;  C67 Possible C7T1 anterior cervical decompression with fusion plating and bonegraft   BACK SURGERY     CARDIAC CATHETERIZATION  12/03/2001   2.5x67mm S-660 stent to RCA (MI)   CARDIAC CATHETERIZATION  05/06/2002   PTCA & stenting of LAD and PTCA & stenting of mid/distal RCA with 3.0x44mm Zeta stent   CARDIAC CATHETERIZATION  06/03/2002   LAD with prox 30% in-stent restenosis; ramus with 95% ostial lesion; L Cfx & 3-OMs free of disease; mild 30% RCA narrowing & 20-30% in-stent restenosis in RCA (Dr. FABIENE Hasten)   CARDIAC CATHETERIZATION  03/24/2003   overlapping tandem DES 3.0x23 Cypher stent to mid RCA for in-stent restenosis & new lesion beyond previous stent with 3.0x12 SciMed TAXUS (Dr. FABIENE Pinion)   CARDIAC CATHETERIZATION  12/08/2003   3.0x24 TAXUS overlapping 3.0x1mm TAXUS DES in RCA and cuttin balloon atherectomy (Dr. FABIENE Pinion)   CARDIAC CATHETERIZATION  07/10/2007   normal L main, L Cfx & 3-OMs free of disease; LAD with Zeta stent in prox region (patent); RCA with multiple stents (3.0x12 Taxus in ostium with 3.0x20 Taxus overlapping it, and series of 3.0x24  Tacus and Cypher stent overlapping this, and in distal RC 2x5x51mm Zeta(?) stent; ostium of RCA with 80% narrowing - 3x15 cutting balloon - 80% to patient w/NO obstruction (Dr. DELENA. Little)   CARDIAC CATHETERIZATION  06/08/2008   high-grade osital & prox in-stent RCA restenosis - Dr. FABIENE Pinion   COLONOSCOPY N/A 11/22/2015   Procedure: COLONOSCOPY;  Surgeon: Lamar CHRISTELLA Hollingshead, MD;  Location: [facility];  Service: Endoscopy;  Laterality: N/A;  100   CORONARY ARTERY BYPASS GRAFT  2009   free  right radial to PDA for progressive RCA disease & multiple re-stenosis (Dr. WENDI Fellers)   CORONARY ARTERY BYPASS GRAFT N/A 09/03/2023   Procedure: REDO CORONARY ARTERY BYPASS GRAFTING (CABG) x2, USING LEFT INTERNAL MAMMARY ARTERY,LEFT LEG GREATER SAPHENEOUS VEIN HARVESTED ENDOSCOPICLY;  Surgeon: Fellers Dorise POUR, MD;  Location: [facility];  Service: Open Heart Surgery;  Laterality: N/A;   CORONARY BALLOON ANGIOPLASTY N/A 08/17/2023   Procedure: CORONARY BALLOON ANGIOPLASTY;  Surgeon: Jordan, Peter M, MD;  Location: [facility] INVASIVE CV LAB;  Service: Cardiovascular;  Laterality: N/A;   CORONARY STENT INTERVENTION N/A 11/08/2022   Procedure: CORONARY STENT INTERVENTION;  Surgeon: Anner Alm ORN, MD;  Location: [facility] INVASIVE CV LAB;  Service: Cardiovascular;  Laterality: N/A;   CORONARY ULTRASOUND/IVUS N/A 08/17/2023   Procedure: Coronary Ultrasound/IVUS;  Surgeon: Jordan, Peter M, MD;  Location: [facility] INVASIVE CV LAB;  Service: Cardiovascular;  Laterality: N/A;   ILIAC ARTERY STENT Bilateral 01/30/2002   9x29mm Cordis SmartStent to LCIA, 9x3.64mm Genesis stent to RCIA (Dr. DOROTHA Lesches)   LEFT HEART CATH AND CORS/GRAFTS ANGIOGRAPHY N/A 11/08/2022   Procedure: LEFT HEART CATH AND CORS/GRAFTS ANGIOGRAPHY;  Surgeon: Anner Alm ORN, MD;  Location: [facility] INVASIVE CV LAB;  Service: Cardiovascular;  Laterality: N/A;   LEFT HEART CATH AND CORS/GRAFTS ANGIOGRAPHY N/A 08/17/2023   Procedure: LEFT HEART CATH AND CORS/GRAFTS ANGIOGRAPHY;  Surgeon: Jordan, Peter M, MD;  Location: [facility] INVASIVE CV LAB;  Service: Cardiovascular;  Laterality: N/A;   Lower Extremity Arterial Doppler  2009   R CIA stent - less than 50% diameter reduction; R CFA stent - 0-49% diameter reduction; R SFA less than 50% diameter reduction; L CFA, profunda, SFA - equal/less than 50% diameter reduction   LUMBAR LAMINECTOMY/DECOMPRESSION MICRODISCECTOMY Bilateral 03/18/2015   Procedure: LUMBAR LAMINECTOMY/DECOMPRESSION MICRODISCECTOMY 2 LEVELS;  Surgeon: Rockey Peru, MD;   Location: [facility];  Service: Neurosurgery;  Laterality: Bilateral;  Bilateral L34 L45 laminectomies   NM MYOCAR PERF WALL MOTION  07/2013   bruce myoview  - intermediate risk test with abnormal inferior horizontal ST depression; mild, fixed inferobasal defect, but no ischemia   TEE WITHOUT CARDIOVERSION N/A 09/03/2023   Procedure: TRANSESOPHAGEAL ECHOCARDIOGRAM;  Surgeon: Fellers Dorise POUR, MD;  Location: [facility];  Service: Open Heart Surgery;  Laterality: N/A;   TRANSTHORACIC ECHOCARDIOGRAM  07/2013   EF 55-60%, mild LVH; mild MR; LA mildly dilated; RVSP increased; PA peak pressure (mod pulm HTN)       Home Medications    Prior to Admission medications   Medication Sig Start Date End Date Taking? Authorizing Provider  amLODipine  (NORVASC ) 5 MG tablet Take 1 tablet (5 mg total) by mouth daily. 06/11/24   Hilty, Vinie BROCKS, MD  ELIQUIS  5 MG TABS tablet TAKE 1 TABLET BY MOUTH 2 TIMES DAILY. 08/26/24   Hilty, Vinie BROCKS, MD  esomeprazole  (NEXIUM  24HR) 20 MG capsule Take 20 mg by mouth daily at 12 noon.    [provider]  metoprolol  succinate (TOPROL -XL)  50 MG 24 hr tablet TAKE 1 TABLET BY MOUTH EVERY DAY WITH OR IMMEDIATELY FOLLOWING A MEAL. 01/02/24   Hilty, Vinie BROCKS, MD  rosuvastatin  (CRESTOR ) 40 MG tablet Take 1 tablet (40 mg total) by mouth daily. 01/02/24   Hilty, Vinie BROCKS, MD  tamsulosin  (FLOMAX ) 0.4 MG CAPS capsule Take 1 capsule (0.4 mg total) by mouth daily after supper. 08/18/23   Duke, Jon Garre, PA  telmisartan  (MICARDIS ) 80 MG tablet Take 1 tablet (80 mg total) by mouth daily. 01/02/24   West, Katlyn D, NP    Family History Family History  Problem Relation Age of Onset   Heart disease Father    CAD Brother        CABG   CAD Brother        CABG   Colon cancer Neg Hx     Social History Social History   Tobacco Use   Smoking status: Former    Current packs/day: 0.00    Average packs/day: 0.2 packs/day for 30.0 years (6.0 ttl pk-yrs)    Types: Cigarettes     Start date: 08/23/1992    Quit date: 08/23/2022    Years since quitting: 2.2   Smokeless tobacco: Never   Tobacco comments:    1 pk per week - cigarettes  Vaping Use   Vaping status: Never Used  Substance Use Topics   Alcohol use: Yes    Alcohol/week: 12.0 standard drinks of alcohol    Types: 12 Cans of beer per week    Comment: beer 2-3 times per week   Drug use: No    Types: Marijuana     Allergies   Patient has no known allergies.   Review of Systems Review of Systems Per HPI  Physical Exam Triage Vital Signs ED Triage Vitals  Encounter Vitals Group     BP 11/18/24 0820 (!) 184/71     Girls Systolic BP Percentile --      Girls Diastolic BP Percentile --      Boys Systolic BP Percentile --      Boys Diastolic BP Percentile --      Pulse Rate 11/18/24 0820 74     Resp 11/18/24 0820 18     Temp 11/18/24 0820 98.3 F (36.8 C)     Temp Source 11/18/24 0820 Oral     SpO2 11/18/24 0820 95 %     Weight --      Height --      Head Circumference --      Peak Flow --      Pain Score 11/18/24 0821 8     Pain Loc --      Pain Education --      Exclude from Growth Chart --    No data found.  Updated Vital Signs BP (!) 171/73 (BP Location: Right Arm)   Pulse 76   Temp 98.3 F (36.8 C) (Oral)   Resp 18   SpO2 95%   Visual Acuity Right Eye Distance:   Left Eye Distance:   Bilateral Distance:    Right Eye Near:   Left Eye Near:    Bilateral Near:     Physical Exam Vitals and nursing note reviewed.  Constitutional:      Appearance: Normal appearance.  HENT:     Head: Atraumatic.  Eyes:     Extraocular Movements: Extraocular movements intact.     Conjunctiva/sclera: Conjunctivae normal.  Cardiovascular:     Rate and Rhythm: Normal rate.  Pulmonary:     Effort: Pulmonary effort is normal.  Musculoskeletal:        General: Swelling and tenderness present. No deformity or signs of injury. Normal range of motion.     Cervical back: Normal range of motion  and neck supple.     Comments: Localized area of erythema, edema, tenderness to palpation over the left great toe and surrounding areas  Skin:    General: Skin is warm and dry.     Findings: Erythema present. No bruising.  Neurological:     Mental Status: He is oriented to person, place, and time.     Comments: Left foot neurovascularly intact  Psychiatric:        Mood and Affect: Mood normal.        Thought Content: Thought content normal.        Judgment: Judgment normal.      UC Treatments / Results  Labs (all labs ordered are listed, but only abnormal results are displayed) Labs Reviewed - No data to display  EKG   Radiology No results found.  Procedures Procedures (including critical care time)  Medications Ordered in UC Medications  dexamethasone  (DECADRON ) injection 10 mg (10 mg Intramuscular Given 11/18/24 0839)    Initial Impression / Assessment and Plan / UC Course  I have reviewed the triage vital signs and the nursing notes.  Pertinent labs & imaging results that were available during my care of the patient were reviewed by me and considered in my medical decision making (see chart for details).     Consistent with gout, treat with IM Decadron , tart cherry supplements, over-the-counter pain relievers as needed.  Blood pressure significantly elevated today.  When asked, patient states he is compliant with his blood pressure medications but notes that the cardiologist had put him on a medication that did not get filled but cannot tell me which one it is that he is supposed to be taking that he is not currently taking.  Ultimately recommended following up with cardiology on this as he remains unclear which medication he is not currently on that he is supposed to be on.  Continue monitoring home blood pressures, return for worsening or unresolving symptoms.  Final Clinical Impressions(s) / UC Diagnoses   Final diagnoses:  Acute gout of left foot, unspecified  cause  Elevated blood pressure reading     Discharge Instructions      You may review the handout that I have provided that goes over diet and lifestyle changes to reduce gout flares.  I do also recommend taking a tart cherry supplements that you can get over-the-counter daily to help with uric acid levels.  We have given you a steroid shot today to resolve your current gout flare.  Your blood pressure was significantly elevated today as discussed.  Take your medications as prescribed and follow-up with your cardiologist as you said you have 1 pill that has not gotten filled that you do not recall what it was.    ED Prescriptions   None    PDMP not reviewed this encounter.   Stuart Vernell Norris, [place] 11/18/24 1706

## 2024-11-18 NOTE — ED Triage Notes (Signed)
 Thinks he has a gout flare up in left foot.  Pain started Wednesday night last week.  Foot swollen and red.

## 2024-11-18 NOTE — Discharge Instructions (Addendum)
 You may review the handout that I have provided that goes over diet and lifestyle changes to reduce gout flares.  I do also recommend taking a tart cherry supplements that you can get over-the-counter daily to help with uric acid levels.  We have given you a steroid shot today to resolve your current gout flare.  Your blood pressure was significantly elevated today as discussed.  Take your medications as prescribed and follow-up with your cardiologist as you said you have 1 pill that has not gotten filled that you do not recall what it was.

## 2025-01-11 ENCOUNTER — Other Ambulatory Visit: Payer: Self-pay | Admitting: Internal Medicine

## 2025-01-12 ENCOUNTER — Telehealth: Payer: Self-pay | Admitting: Internal Medicine

## 2025-01-12 NOTE — Telephone Encounter (Signed)
" °*  STAT* If patient is at the pharmacy, call can be transferred to refill team.   1. Which medications need to be refilled? (please list name of each medication and dose if known) metoprolol  succinate (TOPROL -XL) 50 MG 24 hr tablet    2. Would you like to learn more about the convenience, safety, & potential cost savings by using the [facility]?    3. Are you open to using the [facility] (Type [facility].  ).   4. Which pharmacy/location (including street and city if local pharmacy) is medication to be sent to? [facility] - [place], [place] - [id number]    5. Do they need a 30 day or 90 day supply? 90 day  "

## 2025-01-14 NOTE — Telephone Encounter (Signed)
 Lipid done on 02/21/24

## 2025-01-15 NOTE — Telephone Encounter (Signed)
 Pt's medication was sent to pt's pharmacy as requested. Confirmation received.

## 2025-01-21 ENCOUNTER — Other Ambulatory Visit: Payer: Self-pay

## 2025-01-23 MED ORDER — TELMISARTAN 80 MG PO TABS: 80.0000 mg | ORAL_TABLET | Freq: Every day | ORAL | 3 refills | Status: AC

## 2025-04-06 ENCOUNTER — Ambulatory Visit: Admitting: Internal Medicine
